# Patient Record
Sex: Female | Born: 1955
Health system: Southern US, Community
[De-identification: ages and names within clinical notes are randomized; demographics above are authoritative.]

## PROBLEM LIST (undated history)

## (undated) DIAGNOSIS — J449 Chronic obstructive pulmonary disease, unspecified: Secondary | ICD-10-CM

## (undated) DIAGNOSIS — Z720 Tobacco use: Secondary | ICD-10-CM

## (undated) DIAGNOSIS — K219 Gastro-esophageal reflux disease without esophagitis: Secondary | ICD-10-CM

## (undated) DIAGNOSIS — I509 Heart failure, unspecified: Secondary | ICD-10-CM

## (undated) DIAGNOSIS — E785 Hyperlipidemia, unspecified: Secondary | ICD-10-CM

## (undated) DIAGNOSIS — E119 Type 2 diabetes mellitus without complications: Secondary | ICD-10-CM

## (undated) DIAGNOSIS — Z0389 Encounter for observation for other suspected diseases and conditions ruled out: Secondary | ICD-10-CM

## (undated) DIAGNOSIS — E7800 Pure hypercholesterolemia, unspecified: Secondary | ICD-10-CM

## (undated) DIAGNOSIS — F209 Schizophrenia, unspecified: Secondary | ICD-10-CM

## (undated) DIAGNOSIS — IMO0001 Reserved for inherently not codable concepts without codable children: Secondary | ICD-10-CM

## (undated) DIAGNOSIS — Z86718 Personal history of other venous thrombosis and embolism: Secondary | ICD-10-CM

## (undated) HISTORY — DX: Personal history of other venous thrombosis and embolism: Z86.718

## (undated) HISTORY — DX: Hyperlipidemia, unspecified: E78.5

## (undated) HISTORY — DX: Reserved for inherently not codable concepts without codable children: IMO0001

## (undated) HISTORY — DX: Gastro-esophageal reflux disease without esophagitis: K21.9

## (undated) HISTORY — DX: Tobacco use: Z72.0

## (undated) HISTORY — DX: Encounter for observation for other suspected diseases and conditions ruled out: Z03.89

## (undated) HISTORY — PX: OTHER SURGICAL HISTORY: SHX169

## (undated) HISTORY — DX: Schizophrenia, unspecified: F20.9

---

## 1998-08-14 ENCOUNTER — Inpatient Hospital Stay (HOSPITAL_COMMUNITY): Admission: EM | Admit: 1998-08-14 | Discharge: 1998-08-19 | Payer: Self-pay | Admitting: Psychiatry

## 1999-09-16 ENCOUNTER — Inpatient Hospital Stay (HOSPITAL_COMMUNITY): Admission: EM | Admit: 1999-09-16 | Discharge: 1999-10-01 | Payer: Self-pay | Admitting: *Deleted

## 2000-05-18 ENCOUNTER — Inpatient Hospital Stay (HOSPITAL_COMMUNITY): Admission: EM | Admit: 2000-05-18 | Discharge: 2000-06-06 | Payer: Self-pay | Admitting: Psychiatry

## 2000-06-19 ENCOUNTER — Inpatient Hospital Stay (HOSPITAL_COMMUNITY): Admission: EM | Admit: 2000-06-19 | Discharge: 2000-06-28 | Payer: Self-pay | Admitting: Psychiatry

## 2000-08-23 ENCOUNTER — Other Ambulatory Visit: Admission: RE | Admit: 2000-08-23 | Discharge: 2000-08-23 | Payer: Self-pay | Admitting: *Deleted

## 2001-01-01 ENCOUNTER — Inpatient Hospital Stay (HOSPITAL_COMMUNITY): Admission: EM | Admit: 2001-01-01 | Discharge: 2001-01-09 | Payer: Self-pay | Admitting: Psychiatry

## 2001-02-07 ENCOUNTER — Encounter: Payer: Self-pay | Admitting: Family Medicine

## 2001-02-07 ENCOUNTER — Ambulatory Visit (HOSPITAL_COMMUNITY): Admission: RE | Admit: 2001-02-07 | Discharge: 2001-02-07 | Payer: Self-pay | Admitting: Family Medicine

## 2002-03-05 ENCOUNTER — Inpatient Hospital Stay (HOSPITAL_COMMUNITY): Admission: EM | Admit: 2002-03-05 | Discharge: 2002-03-13 | Payer: Self-pay | Admitting: Psychiatry

## 2002-08-05 ENCOUNTER — Emergency Department (HOSPITAL_COMMUNITY): Admission: EM | Admit: 2002-08-05 | Discharge: 2002-08-05 | Payer: Self-pay | Admitting: Emergency Medicine

## 2002-08-05 ENCOUNTER — Encounter: Payer: Self-pay | Admitting: Emergency Medicine

## 2003-08-31 ENCOUNTER — Other Ambulatory Visit: Admission: RE | Admit: 2003-08-31 | Discharge: 2003-08-31 | Payer: Self-pay | Admitting: Family Medicine

## 2004-06-23 ENCOUNTER — Ambulatory Visit (HOSPITAL_COMMUNITY): Admission: RE | Admit: 2004-06-23 | Discharge: 2004-06-23 | Payer: Self-pay | Admitting: Internal Medicine

## 2006-05-14 ENCOUNTER — Emergency Department (HOSPITAL_COMMUNITY): Admission: EM | Admit: 2006-05-14 | Discharge: 2006-05-14 | Payer: Self-pay | Admitting: Family Medicine

## 2006-09-10 ENCOUNTER — Ambulatory Visit (HOSPITAL_COMMUNITY): Admission: RE | Admit: 2006-09-10 | Discharge: 2006-09-10 | Payer: Self-pay | Admitting: Internal Medicine

## 2008-05-11 ENCOUNTER — Encounter: Admission: RE | Admit: 2008-05-11 | Discharge: 2008-05-11 | Payer: Self-pay | Admitting: Internal Medicine

## 2008-08-11 ENCOUNTER — Encounter: Payer: Self-pay | Admitting: Internal Medicine

## 2008-08-11 ENCOUNTER — Emergency Department (HOSPITAL_COMMUNITY): Admission: EM | Admit: 2008-08-11 | Discharge: 2008-08-11 | Payer: Self-pay | Admitting: Emergency Medicine

## 2008-08-28 ENCOUNTER — Ambulatory Visit: Payer: Self-pay | Admitting: Internal Medicine

## 2008-08-28 DIAGNOSIS — E785 Hyperlipidemia, unspecified: Secondary | ICD-10-CM | POA: Insufficient documentation

## 2008-08-28 DIAGNOSIS — J90 Pleural effusion, not elsewhere classified: Secondary | ICD-10-CM | POA: Insufficient documentation

## 2008-08-29 LAB — CONVERTED CEMR LAB
ALT: 16 units/L (ref 0–35)
AST: 14 units/L (ref 0–37)
Alkaline Phosphatase: 109 units/L (ref 39–117)
Total Bilirubin: 0.4 mg/dL (ref 0.3–1.2)

## 2008-09-03 ENCOUNTER — Telehealth (INDEPENDENT_AMBULATORY_CARE_PROVIDER_SITE_OTHER): Payer: Self-pay | Admitting: *Deleted

## 2008-09-15 ENCOUNTER — Ambulatory Visit: Payer: Self-pay | Admitting: Internal Medicine

## 2008-09-15 LAB — CONVERTED CEMR LAB
Basophils Absolute: 0.1 10*3/uL (ref 0.0–0.1)
Eosinophils Absolute: 1.3 10*3/uL — ABNORMAL HIGH (ref 0.0–0.7)
HCT: 42.5 % (ref 36.0–46.0)
Hemoglobin: 14.8 g/dL (ref 12.0–15.0)
Lymphs Abs: 2.7 10*3/uL (ref 0.7–4.0)
MCHC: 34.8 g/dL (ref 30.0–36.0)
Monocytes Absolute: 0.8 10*3/uL (ref 0.1–1.0)
Neutro Abs: 7.6 10*3/uL (ref 1.4–7.7)
Platelets: 292 10*3/uL (ref 150.0–400.0)
RDW: 12.6 % (ref 11.5–14.6)

## 2008-09-18 ENCOUNTER — Ambulatory Visit (HOSPITAL_COMMUNITY): Admission: RE | Admit: 2008-09-18 | Discharge: 2008-09-18 | Payer: Self-pay | Admitting: Internal Medicine

## 2008-10-28 ENCOUNTER — Ambulatory Visit: Payer: Self-pay | Admitting: Internal Medicine

## 2008-10-28 DIAGNOSIS — R05 Cough: Secondary | ICD-10-CM

## 2008-12-14 ENCOUNTER — Encounter: Payer: Self-pay | Admitting: Internal Medicine

## 2010-06-12 ENCOUNTER — Encounter: Payer: Self-pay | Admitting: Internal Medicine

## 2010-08-31 LAB — BODY FLUID CELL COUNT WITH DIFFERENTIAL
Eos, Fluid: 16 %
Lymphs, Fluid: 79 %
Monocyte-Macrophage-Serous Fluid: 2 % — ABNORMAL LOW (ref 50–90)

## 2010-08-31 LAB — AMYLASE, BODY FLUID: Amylase, Fluid: 82 U/L

## 2010-08-31 LAB — GLUCOSE, SEROUS FLUID: Glucose, Fluid: 91 mg/dL

## 2010-08-31 LAB — PROTEIN, BODY FLUID

## 2010-09-01 LAB — RHEUMATOID FACTOR: Rhuematoid fact SerPl-aCnc: <20 IU/mL (ref 0–20)

## 2010-09-01 LAB — POCT I-STAT, CHEM 8
Calcium, Ion: 1.17 mmol/L (ref 1.12–1.32)
Chloride: 101 mEq/L (ref 96–112)
Glucose, Bld: 84 mg/dL (ref 70–99)
HCT: 43 % (ref 36.0–46.0)

## 2010-09-01 LAB — SEDIMENTATION RATE: Sed Rate: 18 mm/hr (ref 0–22)

## 2010-09-01 LAB — ANA: Anti Nuclear Antibody(ANA): NEGATIVE

## 2010-09-01 LAB — POCT CARDIAC MARKERS: Troponin i, poc: <0.05 ng/mL (ref 0.00–0.09)

## 2010-10-07 NOTE — H&P (Signed)
NAME:  Kim [name], Kim [name]                          ACCOUNT NO.:  [id number]   MEDICAL RECORD NO.:  [id number]                   PATIENT TYPE:  IPS   LOCATION:  0400                                 FACILITY:  [facility]   PHYSICIAN:  Jeanice Lim, M.D.              DATE OF BIRTH:  [birth year]   DATE OF ADMISSION:  03/05/2002  DATE OF DISCHARGE:                         PSYCHIATRIC ADMISSION ASSESSMENT   IDENTIFYING INFORMATION:  The patient is a 55 year old single white female  involuntarily admitted on March 05, 2002.   HISTORY OF PRESENT ILLNESS:  The patient is here on papers.  Commitment  papers report that patient has been uncooperative with taking medications.  She has been missing appointments, delusional, seeing others replaced with  imposters, anxious and irritable with decrease in her ADLs.  The patient has  been monitored by the [facility] team but has been, as stated, uncooperative.  The  patient has been speaking to dead relatives.  She thinks she is 55 years  old.  The patient's thought processes are very disorganized and  unintelligible and information is basically obtained from chart documents.   PAST PSYCHIATRIC HISTORY:  Fourth visit to [facility].  The  patient has been admitted for similar problems.  Has a history of paranoid  schizophrenia.  Is followed by the [facility] team and Dr. Floyde Parkins.   SOCIAL HISTORY:  This is a 55 year old Caucasian female who lives alone.  She is on disability.   FAMILY HISTORY:  Unclear.   ALCOHOL/DRUG HISTORY:  Appears to be no substance abuse problem.   PRIMARY CARE PHYSICIAN:  Unknown.   MEDICAL PROBLEMS:  No apparent health problems indicated.   MEDICATIONS:  The patient has been on Risperdal 0.5 mg b.i.d. with 3 mg  q.h.s., Cogentin 1 mg b.i.d. and trazodone 50 mg q.h.s.  The patient has  been noncompliant with her medications for some time.   ALLERGIES:  No known allergies.   PHYSICAL EXAMINATION:  GENERAL:  The patient  is very sleepy.  She is a 89-  year-old Caucasian female in bed and in no acute distress.  She is unkempt.  Appears older than stated age.  HEENT:  Her head is normocephalic.  Her hair is long, pulled back, dirty.  No eye contact.  External ear canals are patent.  The patient hears me  without any difficulty.  NECK:  Supple.  Negative lymphadenopathy.  Thyroid is nonpalpable.  CHEST:  Clear to auscultation.  No cough.  No adventitious sounds.  The  patient appears to be in no acute distress.  Respiratory rate is at 20.  HEART:  Regular rate and rhythm without murmurs, gallops or rubs.  Carotid  pulses are equal.  There was no edema noted.  BREASTS:  Exam is deferred.  ABDOMEN:  Soft, nontender abdomen.  No masses or organomegaly.  MUSCULOSKELETAL:  The patient was observed walking unsteady in her gait but  there appears to be no signs of injury.  SKIN:  Warm and dry with strong bilateral radial pulses.  Nail beds are  pink, dirty and nails of different sizes.  No rashes or lacerations were  noted.  NEUROLOGIC:  The patient's thought processes are very disorganized,  delusional.  She is unable to perform cerebellar function at this time.   LABORATORY DATA:  WBC count is elevated at 11, RDW is elevated mildly at 15,  BUN 3, potassium 2.2, albumin 3.2, alkaline phosphatase is elevated at 122.   MENTAL STATUS EXAM:  The patient is very sleepy.  There is no eye contact.  She is unkempt.  Her speech is slurred and unintelligible, answering  inappropriately to questions.  Thought processes are incoherent, delusional  with positive auditory hallucinations.  Cognitively, she is oriented to  place.  Knew she was in [place].  She answers to her name.  Difficult to  ascertain any other cognitive functioning.  Her judgment and insight are  poor.   DIAGNOSES:   AXIS I:  Paranoid schizophrenia.   AXIS II:  Deferred.   AXIS III:  None.   AXIS IV:  Severe.   AXIS V:  Current 25; estimated  this past year 60.   PLAN:  Involuntary commitment to [facility] for psychosis.  Contract for safety.  Check every 15 minutes.  The patient to be placed on  the [address] for close monitoring.  Will check her labs.  Give patient  potassium as needed and monitor her electrolytes closely.  Will monitor her  food and fluid intake.  We will resume her medications and have Zydis  Zyprexa available for agitation.  Plan is to stabilize her mood and thinking  so patient can be safe and functional, for patient to be medication-  compliant, to follow up with the [facility] team, to decrease further  hospitalizations.   TENTATIVE LENGTH OF STAY:  Five days or more depending on patient's response  to medication.     Landry Corporal, N.P.                       Jeanice Lim, M.D.    JO/MEDQ  D:  03/06/2002  T:  03/06/2002  Job:  [id number]

## 2010-10-07 NOTE — H&P (Signed)
[facility]  Patient:    Kim [name], Kim [name]                         MRN: [id number] Adm. Date:  [id number] Attending:  Otilio Saber Dictator:   Eduard Roux, N.P.                   Psychiatric Admission Assessment  DATE OF ADMISSION:  September 16, 1999.  PATIENT IDENTIFICATION:  Ms. Conrow is a 55 year old white single female, involuntarily admitted secondary to increase in psychotic behavior and delusional thinking.  HISTORY OF PRESENT ILLNESS:  The patient is a client of [facility] and was referred to [place] via their services.  According to reports, the patient has been noncompliant with her medication.  She is not sleeping nor eating, not caring for her hygiene or herself.  The patient presents floridly psychotic.  She currently thinks that she is a 63-year-old child named [place]. She also thinks she has machines in her head with triangles that are making her strong as well as making her weak.  She has voiced that her brother is an alien.  She also thinks she is wearing a face mask of one of her relatives. Upon presentation to the unit on September 16, 1999, the patient was very resistant to the admission process, refused to answer questions.  She is continuing to refuse to have her blood drawn.  She states if her blood escapes her body that it will kill everyone around [place] in this area.  She also states she has no place to "sit."  She states she has "sat at a friends house" for four days but was unable to "sit there any longer."  The patient states she has no place to live.  She is very difficult to interview secondary to her floridly psychotic state at present.  PAST PSYCHIATRIC HISTORY:  Per intake report, client is a client of [facility].  She has had numerous inpatient hospitalizations and has a lengthy history of mental illness.  She has been in [facility] several times, [facility] in March  of 2000 and September of 1997.  Most recent hospitalization was in February 2001 in [place], [place].  I was unable to get a clear picture of previous psychotropic medication trials that patient has tried.  SOCIAL HISTORY:  Per intake, the patient lives with her brother as stated in HPI.  The patient currently believes that she is homeless.  FAMILY HISTORY:  Per intake, both brother and her mother are schizophrenic.  ALCOHOL AND DRUG HISTORY:  Patient denies.  PAST MEDICAL HISTORY:  Primary care Arrietty Dercole:  Patient denies.  Medical problems:  Difficulty to ascertain.  The patients physical examination ______ her mental problems as her body, she states it is "not her own, it is this relatives body."  MEDICATIONS:  Per [facility], the patient was on: 1. Seroquel 100 mg b.i.d. and 400 mg q.h.s. 2. Possibly Restoril 30 mg q.h.s.  It appears the patient has been noncompliant since approximately October.  DRUG ALLERGIES:  Per [facility], no known allergies.  The patient was unable to supply the information.  PHYSICAL EXAMINATION:  Physical examination is pending.  The patient is floridly psychotic at present and physical examination is not appropriate. Her vital signs are stable upon admission to the unit.  She is afebrile at 98.4, pulse is 117, respirations  are 20, blood pressure is 128/88, 116/86. She is coughing but it appears to be nonproductive.  She is very active on the unit.  She denies chest pain, shortness of breath, and again the patient is refusing to have laboratory test drawn.  She appears to be in no acute distress.  MENTAL STATUS EXAMINATION:  Appearance and behavior:  A disheveled white female.  Her speech is normal tone.  It is rapid, irrelevant.  Mood and affect are floridly psychotic.  She appears agitated and intermittently paranoid. Her eyes are darting around.  Her thought processes are extremely disorganized and  delusional.  She denies suicidal ideation and homicidal ideation.  It is difficult to ascertain whether patient has a history of self-harm or any aggressive acts towards others.  She appears to be attending to internal stimuli.  Unable to ascertain her cognitive of function.  ADMISSION DIAGNOSES: Axis I:    Schizophrenia. Axis II:   Deferred. Axis III:  Unable to ascertain. Axis IV:   Severe related to psychosocial problems and mental illness. Axis V:    Current GAF is 25, highest past year is 60.  TREATMENT PLAN AND RECOMMENDATIONS:  Involuntary admission to [facility] for stabilization of psychotic symptoms.  We will provide 15 minute checks for safety.  We will additionally continue to try to get screening laboratory values to ascertain patients medical stability.  Again, as noted under physical examination.  We will attempt to perform one when patients condition stabilizes.  She does not appear to be in any acute distress; however, she is floridly psychotic.  We will discontinue the Risperdal.  The patient states she does not recognize that name and will not take it.  Seroquel 200 mg now, dose given.  The patient did take that in front of this Clinical research associate.  We will additionally try to resume Seroquel 100 mg b.i.d. and 400 mg q.h.s. as per [facility] records.  The patient may take this medication since she does recognize it.  We also will have orders written for Ativan 2 mg p.o. or IM q.4h. p.r.n. for agitation.  Haldol 5 mg p.o. or IM 14 p.r.n. for severe agitation.  Restoril 30 mg p.o. q.h.s. p.r.n. for sleep.  TENTATIVE LENGTH OF STAY:  Five days with followup at [facility].  If patient continues to be very resistant to medication, may have to evaluate a force med order. DD:  09/20/99 TD:  09/21/99 Job: [id number]
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
## 2010-10-07 NOTE — Discharge Summary (Signed)
[facility]  Patient:    Kim [name], Kim [name]                         MRN: [id number] Adm. Date:  [id number] Disc. Date: [id number] Attending:  Otilio Saber Dictator:   Valinda Hoar, N.P.                           Discharge Summary  HISTORY OF PRESENT ILLNESS:  Kim [name] is a 55 year old white single female, involuntarily admitted secondary to increase in psychotic behavior and delusional thinking.  The patient is a client of the [facility] and was referred to [place] via their services.  According to reports the patient has not Kim [name] compliant with her medications.  She is not sleeping or eating, has not Kim [name] caring for her hygiene or herself.  The patient presents floridly psychotic.  She currently thinks that she is a 50-year-old named Armed forces logistics/support/administrative officer.  She also thinks she has machines in her head with triangles that are making her strong as well as making her weak.  She hears voices that her brother is an alien.  She also thinks she is wearing a face mask of one of her relatives.  Upon presentation at the unit September 16, 1999, the patient was very resistant to the admission process, refused to answer questions.  She is continuing to refuse to have her blood drawn.  She states her blood escapes her body, then it will kill everyone around [place] in the area.  She also states she has no place to "sit."  She states she has "sat" at a friends house for four days, but was unable to "sit there any longer."  Patient states that she has no place to live.  She is very difficult to interview, secondary to her floridly psychotic state at present.  PAST MEDICAL HISTORY:  Patient states she does not have a primary care physician.  She said she had no medical problems, but this was difficult to ascertain.  ADMISSION MEDICATIONS: 1. Per [facility], she is on Seroquel 100 mg b.i.d. and    400 mg at h.s. 2. Possibly Restoril 30 mg q.h.s.  It  appears that the client has Kim [name]    noncompliant since approximately October.  DRUG ALLERGIES:  Per [facility], no known drug allergies.  The patient was unable to supply [place] any of this information.  PHYSICAL EXAMINATION:  Physical exam is pending.  The patient is floridly psychotic at present, and physical examination is not appropriate.  Her vital signs are stable upon admission to the unit.  She is afebrile at 98.4, pulse was 117, respirations 20, blood pressure 128/88.  She is coughing, but it appears to be nonproductive.  She is very active on the unit.  She denies chest pain, shortness of breath, and again the patient is refusing to have laboratory tests drawn.  She appears to be in no acute distress at this time.  MENTAL STATUS EXAMINATION:  A disheveled white female.  Speech is normal tone, rapid but irrelevant.  Mood and affect floridly psychotic.  She appears agitated and intermittently paranoid.  Eyes are darting around.  Her thought processes are extremely disorganized and delusional.  She denies suicidal ideation.  Homicidal ideation is difficult to ascertain via the patient with a history of self-harm or any aggressive acts towards others.  She appears to  be attending to internal stimuli.  I am unable to ascertain her cognitive functioning at this time.  LABORATORY DATA:  She did have an EKG which had nonspecific T-wave abnormalities, otherwise she had no lab work done and she was admitted directly to our unit and again she refused to have a physical exam, refused all lab work.  ADMITTING DIAGNOSES: Axis I:    Schizophrenia. Axis II:   Deferred. Axis III:  Unable to ascertain due to the psychotic state. Axis IV:   Severe, related to psychosocial problems and mental illness. Axis V:    Current GAF is 25, highest in the past year has Kim [name] 60.  HOSPITAL COURSE:  The patient was admitted to the [facility] unit and initially we did start  her on Risperdal 0.5 mg p.o. b.i.d. and 1 mg h.s., Ativan 0.5 mg p.o. q.4h. p.r.n. agitation, Risperdal 30 mg p.o. h.s. p.r.n. for sleep.  On May 1, the patient was a little calmer appearing, less paranoid and irritable.  She is still quite delusional, saying she has triangles marching in her body, denying she is herself, and still believing her teeth are not hers.  Patient is sleeping fairly well, appetite fair.  Seroquel was increased and we added a small amount of Haldol.  She continued to be psychotic, remained delusional about the triangles marching in her body.  She was eating and sleeping better.  She did bathe.  On May 5 she remained on 700 of Seroquel, 4 mg of Haldol, and she appeared to be tolerating the meds well, and therefore the Seroquel was increased to 800 mg at h.s.  On May 6 she had Kim [name] sleeping good, appetite was good.  She was depressed about being in the hospital, but not homicidal or suicidal.  She continued to be delusional about the triangle machines in her body to reduce the heart.  She goes into a subconscious state of consciousness.  She says when the triangles start marching, she still feels like somebody is trying enslave her for unknown reason.  She still feels _________ control of herself.  Again there were side effects and she was tolerating 800 mg of Seroquel with no problems.  The next day she was seen and she agreed to sign involuntarily, so she was released from an involuntary commitment.  Sleep good, appetite good.  She was not suicidal.  She was angry at ex-guardian.  She feels her monies are being stolen from her. She continued to be delusional, although she was calmer.  On the day of discharge she reported feeling better.  She still has some paranoid delusions, but not as upset or bothered by them.  Her mood and affect were euthymic.  Patient still withdrawn.  Sleeping and eating well.  She was less sedated, tolerating meds, with no OPS or abnormal  movement.  No suicidal or homicidal thoughts.  It was decided that she had reached her baseline and that she could be discharged safely and be followed by the [facility].   CONDITION ON DISCHARGE:  Patient is discharged in improved condition with improvement in her mood, sleep, appetite, moderate alleviation of her delusions and paranoia, but basically still having thoughts about the triangles, and this appears to be her baseline according to Mental Health.  It was felt that she could be managed safely on outpatient basis as long as she is followed up by mental health center.  FOLLOW-UP:  She is to follow up at [facility]  Center May 18, 4 p.m. with [name].  DISCHARGE MEDICATIONS: 1. Zyprexa 15 mg one tablet at bedtime. 2. Seroquel 100 mg three tablets at bedtime.  FINAL DIAGNOSES: Axis I:    Schizophrenia, undifferentiated type. Axis II:   Deferred. Axis III:  Unable to ascertain. Axis IV:   Moderate, related to mental illness. Axis V:    Current global assessment of functioning 50, highest in the past            year 60. DD:  10/13/99 TD:  10/15/99 Job: [id number]

## 2010-10-07 NOTE — Discharge Summary (Signed)
NAME:  Kim [name], Kim [name]                          ACCOUNT NO.:  [id number]   MEDICAL RECORD NO.:  [id number]                   PATIENT TYPE:  IPS   LOCATION:  0400                                 FACILITY:  [facility]   PHYSICIAN:  Jeanice Lim, M.D.              DATE OF BIRTH:  02/17/1956   DATE OF ADMISSION:  03/05/2002  DATE OF DISCHARGE:  03/13/2002                                 DISCHARGE SUMMARY   IDENTIFYING DATA:  This is a 55 year old single Caucasian female  involuntarily admitted due to being uncooperative taking medications,  missing appointments.  She was delusional, feeling that others have replaced  people near her with imposters, decreased ADLs, had been speaking to dead  relatives.   MEDICATIONS:  In the past, Risperdal, Cogentin, trazodone.  The patient had  been noncompliant with medications for some time.   ALLERGIES:  No known drug allergies.   PHYSICAL EXAMINATION:  Essentially within normal limits.  Neurologically  nonfocal.   LABORATORY DATA:  WBC slightly elevated at 11.  Potassium significantly low  at 2.2.   MENTAL STATUS EXAM:  The patient was quite sleepy.  There was no eye  contact.  Unkempt.  Speech slurred and unintelligible, inappropriately  answering questions.  Thought processes incoherent, delusional, positive for  auditory hallucinations.  She was cognitively oriented to place.  She knew  she was in [place] and answered to her name.  Otherwise difficult to  ascertain other cognitive functioning.  Judgment and insight were poor.   ADMISSION DIAGNOSES:   AXIS I:  Paranoid schizophrenia.   AXIS II:  None.   AXIS III:  Hypokalemia, significant.   AXIS IV:  Severe (problems with limited support system).   AXIS V:  25/60.   HOSPITAL COURSE:  The patient was admitted and placed on the 35 Hall for  close observation and underwent further monitoring.  She was medically  monitored closely due to degree of psychosis and low potassium.   Risperdal  was optimized and patient was given potassium for replacement.  The patient  tolerated optimization of Risperdal, became less paranoid and less  distressed without psychotic symptoms.  The patient gradually reported no  preoccupation with delusional thoughts.  No distress from fixed delusions  and no mood swings.  No dangerous ideation or grossly inappropriate  behavior.   CONDITION ON DISCHARGE:  Markedly improved.  Mood was more euthymic.  Affect  brighter.  Thought processes goal directed.  Thought content negative for  dangerous ideation.  The patient reported no distress from fixed delusions  nor preoccupation with delusions and patient reported motivation to be  compliant with medications and follow-up plan.   DISCHARGE MEDICATIONS:  1. Risperdal 2 mg, 1/2 q.a.m., 1/2 at 3 p.m. and 1/2 q.h.s.  2. K-Dur 20 mEq q.a.m. and q.h.s.  3. Cogentin 1 mg t.i.d.   DISCHARGE DIAGNOSES:   AXIS I:  Paranoid schizophrenia.  AXIS II:  None.   AXIS III:  Hypokalemia, significant.   AXIS IV:  Severe (problems with limited support system).   AXIS V:  Global Assessment of Functioning on discharge 50-55.                                               Jeanice Lim, M.D.    JEM/MEDQ  D:  03/31/2002  T:  03/31/2002  Job:  [id number]

## 2010-10-07 NOTE — Discharge Summary (Signed)
[facility]  Patient:    Kim [name], Kim [name] Visit Number: [id number] MRN: [id number]          Service Type: PSY Location: 40 0403 01 Attending Physician:  Rachael Fee Dictated by:   Reymundo Poll Dub Mikes, M.D. Admit Date:  01/01/2001 Discharge Date: 01/09/2001                             Discharge Summary  CHIEF COMPLAINT AND HISTORY OF PRESENT ILLNESS:  This was the third admission to [facility] for this 55 year old female who was petitioned for involuntary commitment due to somatic delusions and self-endangerment.  Has been exhibiting poor response to treatment.  Somatic delusions, poor attention to her ADLs, and is reported to be dangerous to herself, petitioned by [facility].  Felt had received the skin of someone else, she needed to be incinerated.  She was also felt that she was a Administrator, Civil Service in [place].  Is denying any auditory or visual hallucinations, reported some depressive symptoms.  Thought there were some men at work who wanted to take her to a hotel, she was fearful.  Denied any suicidal or homicidal ideas. Reported sleep and appetite had been satisfactory.  Some questionable compliance with medication.  PAST PSYCHIATRIC HISTORY:  Third hospitalization, last visit was four or five months ago.  Long history of schizophrenia and multiple hospitalizations ______ institution.  SUBSTANCE ABUSE HISTORY:  Denied the use or abuse of any substances.  MEDICATIONS UPON ADMISSION: 1. Cogentin 1 mg in the morning and 2 mg at bedtime. 2. Risperdal 1 mg at bedtime. 3. Magnesium oxide 400 mg daily. 4. Haldol 100 mg injection every four weeks, last injection August 2.  MENTAL STATUS EXAMINATION UPON ADMISSION:  Revealed an well-nourished, well-developed, alert, cooperative female, good eye contact, unkempt, rocking back and forth in her chair.  Speech was normal tone, somewhat irrelevant answers.  Mood was anxious.  Affect was  anxious.  Thought process: Questionable auditory hallucinations, some thought blocking, no visual hallucinations, positive delusions, positive paranoia.  Cognition: Well preserved.  ADMITTING DIAGNOSES: Axis I:    Schizophrenia, chronic, undifferentiated. Axis II:   No diagnosis. Axis III:  No diagnosis. Axis IV:   Moderate. Axis V:    Global assessment of functioning upon admission 25, highest global            assessment of functioning in the last year 55-60  HOSPITAL COURSE:  She was admitted and started in intensive individual and group psychotherapy, working to improve reality testing.  She was kept on the Risperdal 0.5 mg in the morning and 1 mg at bedtime, eventually was increased to 0.25 twice a day and 2 mg at bedtime.  We went ahead and finally increased the Risperdal to 0.5 mg twice a day a 3 mg at bedtime.  Slowly, she started coming around.  The delusional ideas were not strong and they were not spontaneously manifested.  She was going to let her job go, it was felt that was a source a stress and mental health was after her to do that.  So she felt that once she decreased the stress she was going to be able to handle things better.  Upon discharge, she was in full contact with reality, mood improved, affect bright, no spontaneous delusional ideas, no suicidal ideas, no homicidal ideas.  DISCHARGE DIAGNOSES: Axis I:    Schizophrenia, chronic, undifferentiated. Axis II:  No diagnosis. Axis III:  No diagnosis. Axis IV:   Moderate. Axis V:    Global assessment of functioning upon discharge 55-60.  DISCHARGE MEDICATIONS: 1. Risperdal 3 mg at bedtime and 0.5 mg twice a day. 2. Cogentin 1 mg twice a day. 3. Ativan 0.5 mg three times a day. 4. Trazodone 50 mg at bedtime. 5. Naproxen 550 mg twice a day.  FOLLOWUP:  [facility]. Dictated by:   Reymundo Poll Dub Mikes, M.D. Attending Physician:  Rachael Fee DD:  02/13/01 TD:  02/14/01 Job:  [id number]

## 2010-10-07 NOTE — H&P (Signed)
[facility]  Patient:    Kim [name], FILTER                         MRN: [id number] Adm. Date:  [id number] Attending:  Beryle Beams                   Psychiatric Admission Assessment  Your Honor;       Ms. Provencal is a 55 year old single Caucasian female who was committed involuntarily to the [facility] on June 19, 2000.  At the time of her admission, the patient was acutely psychotic.  The patient went to the police claiming that people were breaking into her apartment, stealing the body parts of her relatives.  The patient had delusions of age regression and claimed that she was 78-1/2 weeks of age.  She also stated that she had machines in her head.  The patient had not been attending to her activities of daily living.       The patient has a history of multiple psychiatric hospitalizations, several to the [facility] over the past couple of months.  Also, and admission to [facility] in April 2000.  She is under the care of [facility] and sees Dr. [name].  Traditionally, the patient has been medication noncompliant.       At present, the patient is optimally improved.  Her hallucinations have resolved and her delusion that she is two weeks or two months old has defervesced.  She is tolerating her medications well without evidence of tardive dyskinesia, medication reactions or sedation.  Behavior is generally appropriate.  However, given her history of medical noncompliance, I am recommending her discharge from the unit on an outpatient commitment.  The [facility] is aware of this recommendation.  We have talked to Ansel Bong and Eliezer Mccoy at [facility], and they agreed that, given her her recurrent hospitalizations, referral will be made to the [facility] on day program.  Towards that end, and in the service of reducing the number of her psychiatric hospitalizations,  outpatient commitment seems appropriate.  Yours sincerely; DD:  06/28/00 TD:  06/28/00 Job: [id number]

## 2010-12-15 ENCOUNTER — Ambulatory Visit
Admission: RE | Admit: 2010-12-15 | Discharge: 2010-12-15 | Disposition: A | Discharge status: Home / Self Care | Payer: Medicare Other | Source: Ambulatory Visit | Attending: Adult Health Nurse Practitioner | Admitting: Adult Health Nurse Practitioner

## 2010-12-15 ENCOUNTER — Other Ambulatory Visit: Payer: Self-pay | Admitting: Adult Health Nurse Practitioner

## 2010-12-15 DIAGNOSIS — R10819 Abdominal tenderness, unspecified site: Secondary | ICD-10-CM

## 2010-12-15 DIAGNOSIS — R140 Abdominal distension (gaseous): Secondary | ICD-10-CM

## 2011-04-03 ENCOUNTER — Encounter: Payer: Self-pay | Admitting: *Deleted

## 2011-04-04 ENCOUNTER — Institutional Professional Consult (permissible substitution): Payer: Medicare Other | Admitting: Emergency Medicine

## 2011-06-13 DIAGNOSIS — R0609 Other forms of dyspnea: Secondary | ICD-10-CM | POA: Diagnosis not present

## 2011-06-13 DIAGNOSIS — F17200 Nicotine dependence, unspecified, uncomplicated: Secondary | ICD-10-CM | POA: Diagnosis not present

## 2011-06-13 DIAGNOSIS — R05 Cough: Secondary | ICD-10-CM | POA: Diagnosis not present

## 2011-06-13 DIAGNOSIS — J40 Bronchitis, not specified as acute or chronic: Secondary | ICD-10-CM | POA: Diagnosis not present

## 2011-06-13 DIAGNOSIS — R0989 Other specified symptoms and signs involving the circulatory and respiratory systems: Secondary | ICD-10-CM | POA: Diagnosis not present

## 2011-06-14 ENCOUNTER — Other Ambulatory Visit: Payer: Self-pay | Admitting: Adult Health Nurse Practitioner

## 2011-06-14 ENCOUNTER — Ambulatory Visit
Admission: RE | Admit: 2011-06-14 | Discharge: 2011-06-14 | Disposition: A | Discharge status: Home / Self Care | Payer: Medicare Other | Source: Ambulatory Visit | Attending: Adult Health Nurse Practitioner | Admitting: Adult Health Nurse Practitioner

## 2011-06-14 DIAGNOSIS — R509 Fever, unspecified: Secondary | ICD-10-CM | POA: Diagnosis not present

## 2011-06-14 DIAGNOSIS — F17200 Nicotine dependence, unspecified, uncomplicated: Secondary | ICD-10-CM | POA: Diagnosis not present

## 2011-06-14 DIAGNOSIS — R05 Cough: Secondary | ICD-10-CM

## 2011-06-14 DIAGNOSIS — R918 Other nonspecific abnormal finding of lung field: Secondary | ICD-10-CM | POA: Diagnosis not present

## 2011-06-14 DIAGNOSIS — R0602 Shortness of breath: Secondary | ICD-10-CM

## 2011-06-14 DIAGNOSIS — J40 Bronchitis, not specified as acute or chronic: Secondary | ICD-10-CM | POA: Diagnosis not present

## 2011-06-28 DIAGNOSIS — R0989 Other specified symptoms and signs involving the circulatory and respiratory systems: Secondary | ICD-10-CM | POA: Diagnosis not present

## 2011-06-28 DIAGNOSIS — R0609 Other forms of dyspnea: Secondary | ICD-10-CM | POA: Diagnosis not present

## 2011-06-28 DIAGNOSIS — R05 Cough: Secondary | ICD-10-CM | POA: Diagnosis not present

## 2011-06-30 ENCOUNTER — Encounter: Payer: Self-pay | Admitting: Pulmonary Disease

## 2011-06-30 ENCOUNTER — Ambulatory Visit (INDEPENDENT_AMBULATORY_CARE_PROVIDER_SITE_OTHER): Payer: Medicare Other | Admitting: Pulmonary Disease

## 2011-06-30 ENCOUNTER — Ambulatory Visit (HOSPITAL_COMMUNITY)
Admission: RE | Admit: 2011-06-30 | Discharge: 2011-06-30 | Disposition: A | Discharge status: Home / Self Care | Payer: Medicare Other | Source: Ambulatory Visit | Attending: Pulmonary Disease | Admitting: Pulmonary Disease

## 2011-06-30 VITALS — BP 110/70 | HR 85 | Temp 98.5°F | Ht 65.0 in | Wt 183.6 lb

## 2011-06-30 DIAGNOSIS — J90 Pleural effusion, not elsewhere classified: Secondary | ICD-10-CM

## 2011-06-30 DIAGNOSIS — J9819 Other pulmonary collapse: Secondary | ICD-10-CM | POA: Diagnosis not present

## 2011-06-30 DIAGNOSIS — R918 Other nonspecific abnormal finding of lung field: Secondary | ICD-10-CM | POA: Diagnosis not present

## 2011-06-30 NOTE — Patient Instructions (Addendum)
Schedule spirometry -pre/post (breathing test) Xray results-no fluid in lungs, pneumonia has resolved

## 2011-06-30 NOTE — Progress Notes (Signed)
  Subjective:    Patient ID: Kim [name], female    DOB: 01/13/1956, 56 y.o.   MRN: [id number]  HPI  PCP - [facility],Kim [name] ANP  55 yowf  schizophrenic smoker from a group home referred for evaluation of pna & effusion  She presented with Left Pleural effusion 07/2008 -Underwent Left thoracentesis x 1050 09/18/2008> ex with 79% lymphs, 16% eos WBC 3610 Last seen 6/10 >> eos 10.5 % with ESR only 17 not specific but this probably represents a late parapneumonic process with organization/ fibrotic change.   She was seen by PCP 06/13/11 , for productive cough, CXR showed rt lung basal opacity & blunting ofleft costophrenic angle - given ceftx IM & levaquin x 7ds, started on spiriva 'Can I have fluid taken off from lungs?' Smokes 10 cigs /day, no phlegm, dyspnea + Continues to hear voices on risperdal 4 mg daily CXR - shows resolved RLL process,left cp angle blunting cold represent pleural thickening, not much layering on decub film     Review of Systems Patient denies significant dyspnea,cough, hemoptysis,  chest pain, palpitations, pedal edema, orthopnea, paroxysmal nocturnal dyspnea, lightheadedness, nausea, vomiting, abdominal or  leg pains      Objective:   Physical Exam  Gen. Unkempt, disshevelled, in no distress, normal affect ENT - no lesions, no post nasal drip Neck: No JVD, no thyromegaly, no carotid bruits Lungs: no use of accessory muscles, no dullness to percussion, no rhonchi , LLL crackles Cardiovascular: Rhythm regular, heart sounds  normal, no murmurs or gallops, no peripheral edema Abdomen: soft and non-tender, no hepatosplenomegaly, BS normal. Musculoskeletal: No deformities, no cyanosis or clubbing Neuro:  alert, non focal       Assessment & Plan:

## 2011-07-01 NOTE — Assessment & Plan Note (Signed)
Minimal effusion vs thickening on decub film - does not need thoracentesis RLL pneumonia has resolved- no more abx required WOuld suggest spirometry - pre/post for airway obstruction &  to motivate her to quit Smoking cessation remains the most important intervention here but seems to be tied in to her mental illness. Further FU can be with pcp

## 2011-07-18 ENCOUNTER — Ambulatory Visit (INDEPENDENT_AMBULATORY_CARE_PROVIDER_SITE_OTHER): Payer: Medicare Other | Admitting: Pulmonary Disease

## 2011-07-18 DIAGNOSIS — R05 Cough: Secondary | ICD-10-CM

## 2011-07-18 DIAGNOSIS — F209 Schizophrenia, unspecified: Secondary | ICD-10-CM | POA: Diagnosis not present

## 2011-07-18 LAB — PULMONARY FUNCTION TEST

## 2011-07-18 NOTE — Progress Notes (Signed)
Spirometry before and after performed today. 

## 2011-07-20 ENCOUNTER — Telehealth: Payer: Self-pay | Admitting: Pulmonary Disease

## 2011-07-20 NOTE — Telephone Encounter (Signed)
Spriometry showed mild drop in lung capacity She has to focus on smoking cesation Further FU can be with pcp

## 2011-07-21 NOTE — Telephone Encounter (Signed)
Left message for pt to return call.

## 2011-07-26 DIAGNOSIS — J3089 Other allergic rhinitis: Secondary | ICD-10-CM | POA: Diagnosis not present

## 2011-07-26 DIAGNOSIS — G479 Sleep disorder, unspecified: Secondary | ICD-10-CM | POA: Diagnosis not present

## 2011-07-27 ENCOUNTER — Encounter: Payer: Self-pay | Admitting: Pulmonary Disease

## 2011-07-28 NOTE — Telephone Encounter (Signed)
I informed pt of RA's findings and recommendations. Pt verbalized understanding  

## 2011-09-21 DIAGNOSIS — G4730 Sleep apnea, unspecified: Secondary | ICD-10-CM | POA: Diagnosis not present

## 2011-09-21 DIAGNOSIS — G4733 Obstructive sleep apnea (adult) (pediatric): Secondary | ICD-10-CM | POA: Diagnosis not present

## 2011-09-25 DIAGNOSIS — R7309 Other abnormal glucose: Secondary | ICD-10-CM | POA: Diagnosis not present

## 2011-09-25 DIAGNOSIS — F17200 Nicotine dependence, unspecified, uncomplicated: Secondary | ICD-10-CM | POA: Diagnosis not present

## 2011-09-25 DIAGNOSIS — E559 Vitamin D deficiency, unspecified: Secondary | ICD-10-CM | POA: Diagnosis not present

## 2011-09-25 DIAGNOSIS — Z0000 Encounter for general adult medical examination without abnormal findings: Secondary | ICD-10-CM | POA: Diagnosis not present

## 2011-09-25 DIAGNOSIS — Z8659 Personal history of other mental and behavioral disorders: Secondary | ICD-10-CM | POA: Diagnosis not present

## 2011-09-25 DIAGNOSIS — E782 Mixed hyperlipidemia: Secondary | ICD-10-CM | POA: Diagnosis not present

## 2011-09-25 DIAGNOSIS — Z23 Encounter for immunization: Secondary | ICD-10-CM | POA: Diagnosis not present

## 2011-10-25 DIAGNOSIS — R1319 Other dysphagia: Secondary | ICD-10-CM | POA: Diagnosis not present

## 2011-10-25 DIAGNOSIS — G4730 Sleep apnea, unspecified: Secondary | ICD-10-CM | POA: Diagnosis not present

## 2011-10-26 DIAGNOSIS — F209 Schizophrenia, unspecified: Secondary | ICD-10-CM | POA: Diagnosis not present

## 2011-10-31 DIAGNOSIS — G4733 Obstructive sleep apnea (adult) (pediatric): Secondary | ICD-10-CM | POA: Diagnosis not present

## 2011-11-01 DIAGNOSIS — G4733 Obstructive sleep apnea (adult) (pediatric): Secondary | ICD-10-CM | POA: Diagnosis not present

## 2011-11-14 DIAGNOSIS — G4730 Sleep apnea, unspecified: Secondary | ICD-10-CM | POA: Diagnosis not present

## 2011-11-15 DIAGNOSIS — Z9119 Patient's noncompliance with other medical treatment and regimen: Secondary | ICD-10-CM | POA: Diagnosis not present

## 2011-11-15 DIAGNOSIS — G4730 Sleep apnea, unspecified: Secondary | ICD-10-CM | POA: Diagnosis not present

## 2011-11-15 DIAGNOSIS — F17200 Nicotine dependence, unspecified, uncomplicated: Secondary | ICD-10-CM | POA: Diagnosis not present

## 2011-11-15 DIAGNOSIS — R0602 Shortness of breath: Secondary | ICD-10-CM | POA: Diagnosis not present

## 2011-12-01 DIAGNOSIS — H00029 Hordeolum internum unspecified eye, unspecified eyelid: Secondary | ICD-10-CM | POA: Diagnosis not present

## 2011-12-26 DIAGNOSIS — F209 Schizophrenia, unspecified: Secondary | ICD-10-CM | POA: Diagnosis not present

## 2011-12-26 DIAGNOSIS — R05 Cough: Secondary | ICD-10-CM | POA: Diagnosis not present

## 2011-12-26 DIAGNOSIS — J3089 Other allergic rhinitis: Secondary | ICD-10-CM | POA: Diagnosis not present

## 2011-12-26 DIAGNOSIS — G4730 Sleep apnea, unspecified: Secondary | ICD-10-CM | POA: Diagnosis not present

## 2012-02-07 DIAGNOSIS — E559 Vitamin D deficiency, unspecified: Secondary | ICD-10-CM | POA: Diagnosis not present

## 2012-02-07 DIAGNOSIS — E119 Type 2 diabetes mellitus without complications: Secondary | ICD-10-CM | POA: Diagnosis not present

## 2012-02-21 DIAGNOSIS — F209 Schizophrenia, unspecified: Secondary | ICD-10-CM | POA: Diagnosis not present

## 2012-02-28 DIAGNOSIS — S8990XA Unspecified injury of unspecified lower leg, initial encounter: Secondary | ICD-10-CM | POA: Diagnosis not present

## 2012-02-28 DIAGNOSIS — S83419A Sprain of medial collateral ligament of unspecified knee, initial encounter: Secondary | ICD-10-CM | POA: Diagnosis not present

## 2012-02-28 DIAGNOSIS — S99929A Unspecified injury of unspecified foot, initial encounter: Secondary | ICD-10-CM | POA: Diagnosis not present

## 2012-02-28 DIAGNOSIS — M25569 Pain in unspecified knee: Secondary | ICD-10-CM | POA: Diagnosis not present

## 2012-04-03 DIAGNOSIS — F209 Schizophrenia, unspecified: Secondary | ICD-10-CM | POA: Diagnosis not present

## 2012-06-25 DIAGNOSIS — F209 Schizophrenia, unspecified: Secondary | ICD-10-CM | POA: Diagnosis not present

## 2012-07-03 DIAGNOSIS — J309 Allergic rhinitis, unspecified: Secondary | ICD-10-CM | POA: Diagnosis not present

## 2012-07-03 DIAGNOSIS — R1032 Left lower quadrant pain: Secondary | ICD-10-CM | POA: Diagnosis not present

## 2012-07-03 DIAGNOSIS — J0110 Acute frontal sinusitis, unspecified: Secondary | ICD-10-CM | POA: Diagnosis not present

## 2012-07-03 DIAGNOSIS — F17200 Nicotine dependence, unspecified, uncomplicated: Secondary | ICD-10-CM | POA: Diagnosis not present

## 2012-08-14 DIAGNOSIS — F17200 Nicotine dependence, unspecified, uncomplicated: Secondary | ICD-10-CM | POA: Diagnosis not present

## 2012-08-14 DIAGNOSIS — E669 Obesity, unspecified: Secondary | ICD-10-CM | POA: Diagnosis not present

## 2012-08-14 DIAGNOSIS — Z79899 Other long term (current) drug therapy: Secondary | ICD-10-CM | POA: Diagnosis not present

## 2012-08-14 DIAGNOSIS — E559 Vitamin D deficiency, unspecified: Secondary | ICD-10-CM | POA: Diagnosis not present

## 2012-08-14 DIAGNOSIS — R42 Dizziness and giddiness: Secondary | ICD-10-CM | POA: Diagnosis not present

## 2012-08-14 DIAGNOSIS — E782 Mixed hyperlipidemia: Secondary | ICD-10-CM | POA: Diagnosis not present

## 2012-08-14 DIAGNOSIS — R5383 Other fatigue: Secondary | ICD-10-CM | POA: Diagnosis not present

## 2012-08-14 DIAGNOSIS — E119 Type 2 diabetes mellitus without complications: Secondary | ICD-10-CM | POA: Diagnosis not present

## 2012-08-14 DIAGNOSIS — R5381 Other malaise: Secondary | ICD-10-CM | POA: Diagnosis not present

## 2012-08-27 DIAGNOSIS — F209 Schizophrenia, unspecified: Secondary | ICD-10-CM | POA: Diagnosis not present

## 2012-09-07 ENCOUNTER — Encounter (HOSPITAL_COMMUNITY): Payer: Self-pay | Admitting: *Deleted

## 2012-09-07 ENCOUNTER — Emergency Department (HOSPITAL_COMMUNITY)
Admission: EM | Admit: 2012-09-07 | Discharge: 2012-09-07 | Disposition: A | Discharge status: Home / Self Care | Payer: Medicare Other | Attending: Emergency Medicine | Admitting: Emergency Medicine

## 2012-09-07 DIAGNOSIS — H6120 Impacted cerumen, unspecified ear: Secondary | ICD-10-CM | POA: Insufficient documentation

## 2012-09-07 DIAGNOSIS — H6690 Otitis media, unspecified, unspecified ear: Secondary | ICD-10-CM | POA: Insufficient documentation

## 2012-09-07 DIAGNOSIS — K219 Gastro-esophageal reflux disease without esophagitis: Secondary | ICD-10-CM | POA: Insufficient documentation

## 2012-09-07 DIAGNOSIS — H9190 Unspecified hearing loss, unspecified ear: Secondary | ICD-10-CM | POA: Diagnosis not present

## 2012-09-07 DIAGNOSIS — E785 Hyperlipidemia, unspecified: Secondary | ICD-10-CM | POA: Diagnosis not present

## 2012-09-07 DIAGNOSIS — Z79899 Other long term (current) drug therapy: Secondary | ICD-10-CM | POA: Diagnosis not present

## 2012-09-07 DIAGNOSIS — F17200 Nicotine dependence, unspecified, uncomplicated: Secondary | ICD-10-CM | POA: Insufficient documentation

## 2012-09-07 DIAGNOSIS — H9209 Otalgia, unspecified ear: Secondary | ICD-10-CM | POA: Diagnosis not present

## 2012-09-07 DIAGNOSIS — H6123 Impacted cerumen, bilateral: Secondary | ICD-10-CM

## 2012-09-07 DIAGNOSIS — F209 Schizophrenia, unspecified: Secondary | ICD-10-CM | POA: Diagnosis not present

## 2012-09-07 DIAGNOSIS — H65199 Other acute nonsuppurative otitis media, unspecified ear: Secondary | ICD-10-CM | POA: Diagnosis not present

## 2012-09-07 MED ORDER — CLINDAMYCIN HCL 150 MG PO CAPS: 300.0000 mg | ORAL_CAPSULE | Freq: Four times a day (QID) | ORAL | Status: DC

## 2012-09-07 NOTE — ED Notes (Signed)
Patient says she has had this problem before where her ears get clogged with wax and she has trouble hearing.  I looked in both her ears and the right ear is the one hurting her, has moderate amount of wax.  The left ear is not bothering her but she has a pustule looking bump draining yellow exudate.  Her left ear also has a moderate amount of wax build up.

## 2012-09-07 NOTE — ED Notes (Signed)
The pt is c/o ear wax build up in both ears for 3-4 days

## 2012-09-07 NOTE — ED Notes (Signed)
Patient is alert and orientedx4.  Patient was explained discharge instructions and they understood them with no questions.   

## 2012-09-07 NOTE — ED Provider Notes (Signed)
History    This chart was scribed for non-physician practitioner Raymon Mutton working with Carleene Cooper III, MD by Quintella Reichert, ED Scribe. This patient was seen in room TR10C/TR10C and the patient's care was started at 9:33 PM .   CSN: [id number]  Arrival date & time 09/07/12  1921       Chief Complaint  Patient presents with  . ear wax build up      The history is provided by the patient. No language interpreter was used.    Kim [name] is a 57 y.o. female who presents to the Emergency Department complaining of bilateral ear wax buildup with onset of 3 days ago, with accompanying moderate ear pain and muffled hearing.  Pt states that ear wax feels hard.  Pt denies fever, neck pain, sore throat, CP, SOB, abdominal pain, nausea, emesis, diarrhea, urinary symptoms, back pain, HA, weakness, numbness, dizziness or any other associated symptoms.      Past Medical History  Diagnosis Date  . GERD (gastroesophageal reflux disease)   . Schizophrenia   . Tobacco abuse   . Hyperlipemia     Past Surgical History  Procedure Laterality Date  . None      No family history on file.  History  Substance Use Topics  . Smoking status: Current Every Day Smoker -- 1.00 packs/day for 21 years    Types: Cigarettes  . Smokeless tobacco: Never Used  . Alcohol Use: No    OB History   Grav Para Term Preterm Abortions TAB SAB Ect Mult Living                  Review of Systems  Constitutional: Negative for fever.  HENT: Positive for hearing loss and ear pain. Negative for sore throat and neck pain.   Respiratory: Negative for shortness of breath.   Cardiovascular: Negative for chest pain.  Gastrointestinal: Negative for nausea, vomiting, abdominal pain and diarrhea.  Genitourinary: Negative for dysuria and difficulty urinating.  Musculoskeletal: Negative for back pain.  Neurological: Negative for dizziness, weakness, numbness and headaches.  All other systems reviewed and  are negative.     Allergies  Penicillins and Quetiapine  Home Medications   Current Outpatient Rx  Name  Route  Sig  Dispense  Refill  . albuterol (PROVENTIL HFA;VENTOLIN HFA) 108 (90 BASE) MCG/ACT inhaler   Inhalation   Inhale 2 puffs into the lungs every 6 (six) hours as needed for shortness of breath.          . cholecalciferol (VITAMIN D) 400 UNITS TABS   Oral   Take 400 Units by mouth daily.         . risperiDONE (RISPERDAL) 1 MG tablet   Oral   Take 1 mg by mouth daily.         . rosuvastatin (CRESTOR) 5 MG tablet   Oral   Take 5 mg by mouth daily.         . varenicline (CHANTIX PAK) 0.5 MG X 11 & 1 MG X 42 tablet   Oral   Take 0.5-1 mg by mouth 2 (two) times daily. Take one 0.5 mg tablet by mouth once daily for 3 days, then increase to one 0.5 mg tablet twice daily for 4 days, then increase to one 1 mg tablet twice daily.         . clindamycin (CLEOCIN) 150 MG capsule   Oral   Take 2 capsules (300 mg total) by mouth 4 (four)  times daily.   60 capsule   0     BP 130/76  Pulse 68  Temp(Src) 98.1 F (36.7 C) (Oral)  Resp 16  SpO2 95%  Physical Exam  Nursing note and vitals reviewed. Constitutional: She is oriented to person, place, and time. She appears well-developed and well-nourished. No distress.  HENT:  Head: Normocephalic and atraumatic.  Mouth/Throat: Oropharynx is clear and moist. No oropharyngeal exudate.  Positive cerumen impaction to both ears. No inflammation, swelling, erythema noted to outer ear or inner ear canal. Positive pustule to left ear that is actively draining pus.  Eyes: Conjunctivae and EOM are normal. Pupils are equal, round, and reactive to light. Right eye exhibits no discharge. Left eye exhibits no discharge.  Neck: Normal range of motion. Neck supple. No tracheal deviation present.  No lymphadenopathy  Cardiovascular: Normal rate, regular rhythm and normal heart sounds.   No murmur heard. Pulmonary/Chest: Effort  normal and breath sounds normal. No respiratory distress. She has no wheezes. She has no rales.  Lymphadenopathy:    She has no cervical adenopathy.  Neurological: She is alert and oriented to person, place, and time. She exhibits normal muscle tone. Coordination normal.  Skin: Skin is warm and dry.  Psychiatric: She has a normal mood and affect. Her behavior is normal. Thought content normal.     ED Course  Procedures (including critical care time)  DIAGNOSTIC STUDIES: Oxygen Saturation is 95% on room air, adequate by my interpretation.    COORDINATION OF CARE: 9:35 PM-Discussed treatment plan which includes ear flushing with pt at bedside and pt agreed to plan.      Labs Reviewed - No data to display No results found.   1. Cerumen impaction, bilateral   2. Otitis media, bilateral       MDM  Patient afebrile, normotensive, non-tachycardic, alert and oriented. Patient reported that it is normal for her ears to get filled with cerumen. Nurse flushed ears out to remove cerumen. I personally removed remaining cerumen with curette that would not come out with flushing - patient tolerated. Patient reported immediate relief after cerumen remoVal. After removal TM noted were erythematous and bulging, bilaterally, left more so than right ear. Patient septic, non-toxic appearing, in no acute distress. Suspected Otitis Media bilaterally. Discharged patient with antibiotics. Discussed with patient to follow-up with PCP, [facility] for re-check of ears. Discussed with patient to clean ears with warm water, soap and rag. Discussed with patient to monitor symptoms and if symptoms are to worsen or change please report back to the ED. Patient agreed to plan of care, understood, all questions answered.  I personally performed the services described in this documentation, which was scribed in my presence. The recorded information has been reviewed and is accurate.    Raymon Mutton,  PA-C 09/08/12 [phone]

## 2012-09-08 NOTE — ED Provider Notes (Signed)
Medical screening examination/treatment/procedure(s) were performed by non-physician practitioner and as supervising physician I was immediately available for consultation/collaboration.   Carleene Cooper III, MD 09/08/12 [phone]

## 2012-09-13 DIAGNOSIS — R7309 Other abnormal glucose: Secondary | ICD-10-CM | POA: Diagnosis not present

## 2012-09-13 DIAGNOSIS — R209 Unspecified disturbances of skin sensation: Secondary | ICD-10-CM | POA: Diagnosis not present

## 2012-09-16 DIAGNOSIS — E119 Type 2 diabetes mellitus without complications: Secondary | ICD-10-CM | POA: Diagnosis not present

## 2012-09-16 DIAGNOSIS — Z111 Encounter for screening for respiratory tuberculosis: Secondary | ICD-10-CM | POA: Diagnosis not present

## 2012-09-16 DIAGNOSIS — L84 Corns and callosities: Secondary | ICD-10-CM | POA: Diagnosis not present

## 2012-09-30 DIAGNOSIS — R0602 Shortness of breath: Secondary | ICD-10-CM | POA: Diagnosis not present

## 2012-11-14 DIAGNOSIS — F209 Schizophrenia, unspecified: Secondary | ICD-10-CM | POA: Diagnosis not present

## 2012-12-05 DIAGNOSIS — E119 Type 2 diabetes mellitus without complications: Secondary | ICD-10-CM | POA: Diagnosis not present

## 2012-12-05 DIAGNOSIS — Z111 Encounter for screening for respiratory tuberculosis: Secondary | ICD-10-CM | POA: Diagnosis not present

## 2012-12-05 DIAGNOSIS — F17200 Nicotine dependence, unspecified, uncomplicated: Secondary | ICD-10-CM | POA: Diagnosis not present

## 2012-12-05 DIAGNOSIS — F209 Schizophrenia, unspecified: Secondary | ICD-10-CM | POA: Diagnosis not present

## 2012-12-05 DIAGNOSIS — R209 Unspecified disturbances of skin sensation: Secondary | ICD-10-CM | POA: Diagnosis not present

## 2013-02-02 ENCOUNTER — Emergency Department (HOSPITAL_COMMUNITY)
Admission: EM | Admit: 2013-02-02 | Discharge: 2013-02-02 | Disposition: A | Discharge status: Home / Self Care | Payer: Medicare Other | Attending: Emergency Medicine | Admitting: Emergency Medicine

## 2013-02-02 ENCOUNTER — Encounter (HOSPITAL_COMMUNITY): Payer: Self-pay | Admitting: Nurse Practitioner

## 2013-02-02 DIAGNOSIS — E785 Hyperlipidemia, unspecified: Secondary | ICD-10-CM | POA: Insufficient documentation

## 2013-02-02 DIAGNOSIS — F17200 Nicotine dependence, unspecified, uncomplicated: Secondary | ICD-10-CM | POA: Diagnosis not present

## 2013-02-02 DIAGNOSIS — Z8719 Personal history of other diseases of the digestive system: Secondary | ICD-10-CM | POA: Insufficient documentation

## 2013-02-02 DIAGNOSIS — Z880 Allergy status to penicillin: Secondary | ICD-10-CM | POA: Diagnosis not present

## 2013-02-02 DIAGNOSIS — L84 Corns and callosities: Secondary | ICD-10-CM | POA: Insufficient documentation

## 2013-02-02 DIAGNOSIS — E7800 Pure hypercholesterolemia, unspecified: Secondary | ICD-10-CM | POA: Diagnosis not present

## 2013-02-02 DIAGNOSIS — F209 Schizophrenia, unspecified: Secondary | ICD-10-CM | POA: Insufficient documentation

## 2013-02-02 DIAGNOSIS — Z79899 Other long term (current) drug therapy: Secondary | ICD-10-CM | POA: Diagnosis not present

## 2013-02-02 HISTORY — DX: Pure hypercholesterolemia, unspecified: E78.00

## 2013-02-02 MED ORDER — AMMONIUM LACTATE 12 % EX LOTN: TOPICAL_LOTION | CUTANEOUS | Status: DC | PRN

## 2013-02-02 MED ORDER — UREA 10 % EX CREA: TOPICAL_CREAM | CUTANEOUS | Status: DC | PRN

## 2013-02-02 NOTE — ED Notes (Signed)
C/o callous to bottom of R foot that is getting bigger over past week and will not respond to OTC callous creams

## 2013-02-02 NOTE — ED Provider Notes (Signed)
CSN: [id number]     Arrival date & time 02/02/13  1517 History  This chart was scribed for non-physician practitioner, Junious Silk PA-C working with Audree Camel, MD by Arlan Organ, ED Scribe. This patient was seen in room TR06C/TR06C and the patient's care was started at 1540.   Chief Complaint  Patient presents with  . Foot Pain    The history is provided by the patient. No language interpreter was used.   HPI Comments: Kim [name] is a 57 y.o. female who presents to the Emergency Department complaining of right foot pain that started a week and a half ago. Pt describes the pain as sharp and shooting, and states the pain worsens when she stands. Pt has tried an salicylic  acid ointment for the last 3 days to dissolve the callous with mild relief. Pt has hx of "very high blood sugar", but does not believe she has diabetes. She is currently trying lifestyle modifications, but drank soda today.    PCP- [facility]  Past Medical History  Diagnosis Date  . GERD (gastroesophageal reflux disease)   . Schizophrenia   . Tobacco abuse   . Hyperlipemia   . Hypercholesteremia    Past Surgical History  Procedure Laterality Date  . None     History reviewed. No pertinent family history. History  Substance Use Topics  . Smoking status: Current Every Day Smoker -- 1.00 packs/day for 21 years    Types: Cigarettes  . Smokeless tobacco: Never Used  . Alcohol Use: No   OB History   Grav Para Term Preterm Abortions TAB SAB Ect Mult Living                 Review of Systems  A complete 10 system review of systems was obtained and all systems are negative except as noted in the HPI and PMH.   Allergies  Penicillins and Quetiapine  Home Medications   Current Outpatient Rx  Name  Route  Sig  Dispense  Refill  . albuterol (PROVENTIL HFA;VENTOLIN HFA) 108 (90 BASE) MCG/ACT inhaler   Inhalation   Inhale 2 puffs into the lungs every 6 (six) hours as needed for  shortness of breath.          . cholecalciferol (VITAMIN D) 400 UNITS TABS   Oral   Take 400 Units by mouth daily.         . risperiDONE (RISPERDAL) 1 MG tablet   Oral   Take 1.5 mg by mouth daily.          . rosuvastatin (CRESTOR) 5 MG tablet   Oral   Take 5 mg by mouth daily.          BP 144/104  Pulse 74  Temp(Src) 97.7 F (36.5 C) (Oral)  Resp 16  Ht 5\' 4"  (1.626 m)  Wt 185 lb (83.915 kg)  BMI 31.74 kg/m2  SpO2 99% Physical Exam  Nursing note and vitals reviewed. Constitutional: She is oriented to person, place, and time. She appears well-developed and well-nourished. No distress.  HENT:  Head: Normocephalic and atraumatic.  Right Ear: External ear normal.  Left Ear: External ear normal.  Nose: Nose normal.  Mouth/Throat: Oropharynx is clear and moist.  Eyes: Conjunctivae are normal.  Neck: Normal range of motion.  Cardiovascular: Normal rate, regular rhythm, normal heart sounds, intact distal pulses and normal pulses.   Cap refill <3 seconds on all toes  Pulmonary/Chest: Effort normal and breath sounds normal. No  stridor. No respiratory distress. She has no wheezes. She has no rales.  Abdominal: Soft. She exhibits no distension.  Musculoskeletal: Normal range of motion.  Neurological: She is alert and oriented to person, place, and time. She has normal strength.  Skin: Skin is warm and dry. She is not diaphoretic. No erythema.  3 cm callus on ball of right foot. No surrounding erythema, warmth, streaking. No drainage.   Psychiatric: She has a normal mood and affect. Her behavior is normal.    ED Course  Procedures (including critical care time)  DIAGNOSTIC STUDIES: Oxygen Saturation is 99% on RA, Normal by my interpretation.    COORDINATION OF CARE: 4:48 PM-Discussed treatment plan which includes foot cream. Pt agreed to plan.     Labs Review Labs Reviewed - No data to display Imaging Review No results found.  MDM   1. Callus    Patient  presents with callus to sole of right foot which has not responded to salicylic acid. Made cushion for foot and gave rx for cream. Follow up with PCP. Neurovascularly intact. Return instructions given. Vital signs stable for discharge. Patient / Family / Caregiver informed of clinical course, understand medical decision-making process, and agree with plan.   I personally performed the services described in this documentation, which was scribed in my presence. The recorded information has been reviewed and is accurate.     Mora Bellman, PA-C 02/02/13 [phone]

## 2013-02-03 NOTE — ED Provider Notes (Signed)
Medical screening examination/treatment/procedure(s) were performed by non-physician practitioner and as supervising physician I was immediately available for consultation/collaboration.   Audree Camel, MD 02/03/13 [phone]

## 2013-04-03 DIAGNOSIS — F209 Schizophrenia, unspecified: Secondary | ICD-10-CM | POA: Diagnosis not present

## 2013-05-29 DIAGNOSIS — F209 Schizophrenia, unspecified: Secondary | ICD-10-CM | POA: Diagnosis not present

## 2013-08-29 DIAGNOSIS — F209 Schizophrenia, unspecified: Secondary | ICD-10-CM | POA: Diagnosis not present

## 2013-09-11 DIAGNOSIS — H6120 Impacted cerumen, unspecified ear: Secondary | ICD-10-CM | POA: Diagnosis not present

## 2013-09-18 DIAGNOSIS — H6120 Impacted cerumen, unspecified ear: Secondary | ICD-10-CM | POA: Diagnosis not present

## 2013-09-30 DIAGNOSIS — J040 Acute laryngitis: Secondary | ICD-10-CM | POA: Diagnosis not present

## 2013-10-08 DIAGNOSIS — E119 Type 2 diabetes mellitus without complications: Secondary | ICD-10-CM | POA: Diagnosis not present

## 2013-10-08 DIAGNOSIS — R0602 Shortness of breath: Secondary | ICD-10-CM | POA: Diagnosis not present

## 2013-10-08 DIAGNOSIS — R609 Edema, unspecified: Secondary | ICD-10-CM | POA: Diagnosis not present

## 2013-10-21 ENCOUNTER — Ambulatory Visit: Payer: Self-pay | Admitting: Physician Assistant

## 2013-10-21 VITALS — BP 116/72 | HR 80 | Temp 97.9°F | Resp 16 | Ht 64.3 in | Wt 208.8 lb

## 2013-10-21 DIAGNOSIS — Z719 Counseling, unspecified: Secondary | ICD-10-CM

## 2013-10-21 NOTE — Progress Notes (Signed)
Subjective:    Patient ID: Kim [name], female    DOB: 08/18/1955, 58 y.o.   MRN: [id number]  HPI   Kim [name] is a pleasant 58 yr old female here requesting DMV paperwork to be completed.  She brings a letter with her that was dated in 2004.  The letter states that forms must be completed within 30 days of the date on the letter.  Pt reports that she has spoken with the [facility] and that they will still accept this form.  She reports she has not been driving for the last 11 years.  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  She reports she is followed by [facility] mental health for this.  Takes 2mg  risperdal nightly.  Reports that she takes this consistently.  Onset of disease in 1981.  Pt reports last hospitalization was in 2003 or 2004.  Pt reports no other medical problems.  She reports she was told she needed to have the vision portion of the form completed.  She wears reading glasses only and has never had corrective lenses   Review of Systems  Constitutional: Negative.   Eyes: Negative.   Respiratory: Negative.   Cardiovascular: Negative.   Psychiatric/Behavioral: Negative for suicidal ideas, hallucinations and dysphoric mood.       Objective:   Physical Exam  Vitals reviewed. Constitutional: She is oriented to person, place, and time. She appears well-developed and well-nourished. No distress.  HENT:  Head: Normocephalic and atraumatic.  Eyes: Conjunctivae and EOM are normal. Pupils are equal, round, and reactive to light. No scleral icterus.  Cardiovascular: Normal rate, regular rhythm and normal heart sounds.   Pulmonary/Chest: Effort normal and breath sounds normal. She has no wheezes. She has no rales.  Neurological: She is alert and oriented to person, place, and time.  Skin: Skin is warm and dry.  Psychiatric: She has a normal mood and affect. Her speech is normal and behavior is normal. Thought content normal.    Visual Acuity Screening   Right eye Left eye Both eyes  Without correction: 20/30 20/30 20/30   With correction:          Assessment & Plan:  Counseling NOS   Kim [name] is a very pleasant 58 yr old female here for completion of DMV paperwork.  The paperwork is dated 2004, and I am not sure that it will suffice to reinstate her license.  I have discussed this with the patient, but she is under the impression that she is still able to turn in this set of forms.  She reports her vision needed to be evaluated.  Her vision is 20/30 in each eye and she does not wear corrective lenses.  I do not think she needs specialist evaluation of this and have written her a letter stating this.  Unfortunately I cannot clear her to return to driving.  She will need clearance from her psychiatrist.  The patient is new to our practice and I am unable to state whether or not she is well controlled and compliant with medication.  I have discussed this with the patient.  She was initially upset about this, but she was under the impression that she needed a full physical exam to be completed.  I explained that she only requires the part of the exam that initially disqualified her from driving.  She understands that the "mental health" section of the form is the only one that needs to be completed, and she understands  that her psychiatrist needs to be the one to complete this.  I have not charged pt the full $150 for completion of DMV paperwork as I am not able to complete it for her   E. Natividad Brood MHS, PA-C [facility] 6/3/201510:02 PM

## 2013-11-05 DIAGNOSIS — R609 Edema, unspecified: Secondary | ICD-10-CM | POA: Diagnosis not present

## 2013-11-05 DIAGNOSIS — E119 Type 2 diabetes mellitus without complications: Secondary | ICD-10-CM | POA: Diagnosis not present

## 2013-11-11 ENCOUNTER — Telehealth: Payer: Self-pay

## 2013-11-11 NOTE — Telephone Encounter (Signed)
Patient called and left a voicemail asking if her records were ready for pick-up. I returned her call and informed told her I would look for the records. Placed call on hold - call dropped. Tried to call patient back on home phone but the call would not go through. Left a message on patient's cell phone that her records are ready for pick-up and to call us if she had any questions.

## 2013-11-14 ENCOUNTER — Ambulatory Visit: Payer: BC Managed Care – PPO | Admitting: Family Medicine

## 2013-11-21 ENCOUNTER — Ambulatory Visit (INDEPENDENT_AMBULATORY_CARE_PROVIDER_SITE_OTHER): Payer: BC Managed Care – PPO | Admitting: Physician Assistant

## 2013-11-21 VITALS — BP 124/84 | HR 66 | Temp 98.5°F | Resp 18 | Ht 65.0 in | Wt 210.4 lb

## 2013-11-21 DIAGNOSIS — Z0289 Encounter for other administrative examinations: Secondary | ICD-10-CM

## 2013-11-21 NOTE — Progress Notes (Signed)
Subjective:    Patient ID: Kim [name], female    DOB: Aug 26, 1955, 58 y.o.   MRN: [id number]  HPI   Kim [name] is a very pleasant 58 yr old female who is here for a DMV physical.  See previous note from 10/21/13 when we discussed this initially. At that time, she had ppw dated 2004 that was no longer valid.  Additionally, I encouraged pt to have her psychiatrist at [facility] complete the paperwork as she lost her license due to schizophrenia  Pt reports that [facility] would not complete her paperwork.  Pt also states that her PCP [facility] told her they "don't do DMV forms."  She was told by her PCP to come here  Pt's first visit to our office was 10/21/13.  She has not been treated by any of our providers.    She does bring her [place] records with her today.  Records indicate that she is well controlled on risperdol 2mg  QHS.  She has been at this dose for some time now.  She lives in a group home with a caregiver who ensures that she takes her medication faithfully.  Last visit to [facility] April 2015.  Pt reports she follows there every 3 months  Pt reports schizophrenia onset 1981.  She reports taking many medications but being on risperdol since 2006.  She takes 2mg  QHS.  Symptoms appear to be well controlled.  The medication "relaxes me" pt reports.  She denies depressive feelings.  She denis SI or HI.  When asked if she experiences auditory or visual hallucinations she states, "that's what the doctor said."  She does not feel that she ever really hallucinated.  She admits that "sometimes I hear someone calling my name, but there's no one there" - she reports this has not happened in 3 yrs.  She was last hospitalized in 2003 - she believes this was at [facility] [place] for about 3 months.  She reports she had follow up here in [place] at [facility].  She has not been driving since 0160.  She is planning to get a job and go back to school to study medical assisting.  She anticipates  needing to drive to and from those places.  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       Objective:   Physical Exam  Vitals reviewed. Constitutional: She is oriented to person, place, and time. She appears well-developed and well-nourished. No distress.  HENT:  Head: Normocephalic and atraumatic.  Pulmonary/Chest: Effort normal.  Neurological: She is alert and oriented to person, place, and time.  Skin: Skin is warm and dry.  Psychiatric: She has a normal mood and affect. Her speech is normal and behavior is normal. Judgment and thought content normal.       Assessment & Plan:  Health examination of defined subpopulation   Kim [name] is a pleasant 58 yr old female here for [facility] paperwork completion.  She reports that neither her PCP, nor [facility] would complete her paperwork.  She has brought records from [facility] with her.  Based on these, she appears to be well controlled on her current medication.  She has a caregiver that helps her take her medication.  Mood, affect, behavior, thought content all appear normal on exam.  I do feel like she is safe to pursue reinstating her driver license.  I do think she needs periodic follow up (q3 months at [facility]) as well as a driving test as she has not driven in 11 years.  I have completed her ppw stating this.  I have made a copy of ppw for pt and to be scanned into the chart.  The originals have been mailed to the [facility].    Of note, I did try to contact [facility] but they were closed for the holiday.  Will contact them Monday 11/24/13 to make them aware that I have completed ppw and get their input   E. Natividad Brood MHS, PA-C [facility] 7/3/20156:57 PM

## 2013-11-24 DIAGNOSIS — E119 Type 2 diabetes mellitus without complications: Secondary | ICD-10-CM | POA: Diagnosis not present

## 2013-11-24 DIAGNOSIS — R609 Edema, unspecified: Secondary | ICD-10-CM | POA: Diagnosis not present

## 2013-11-25 ENCOUNTER — Telehealth: Payer: Self-pay | Admitting: Physician Assistant

## 2013-11-25 NOTE — Telephone Encounter (Signed)
LM for [name] (pt's PCP) at [facility] to discuss [facility] paperwork

## 2013-11-25 NOTE — Telephone Encounter (Signed)
Spoke with [name] at [facility] (pt's PCP) regarding DMV paperwork.  She agrees that pt is stable and safe to pursue reinstating her driver's license

## 2013-11-27 DIAGNOSIS — F209 Schizophrenia, unspecified: Secondary | ICD-10-CM | POA: Diagnosis not present

## 2013-12-01 ENCOUNTER — Telehealth: Payer: Self-pay

## 2013-12-01 NOTE — Telephone Encounter (Signed)
Pt states seen last week for a DMV physical, she was advised this was mailed to the [facility], but per pt it has not been received by the [facility]. Pt wants to verify this was mailed and the date it was sent out

## 2013-12-02 NOTE — Telephone Encounter (Signed)
Yes it should have been mailed the day she came for the physical - Junie Panning I think I handed this off to you, did you put it in the mail?  Unfortunately our mail takes a long time to go out since it has to go through [facility].  Forms were sent for scanning as well, so we should be able to print duplicate copies if needed once those appear in Epic

## 2013-12-02 NOTE — Telephone Encounter (Signed)
lmom for pt to cb

## 2013-12-02 NOTE — Telephone Encounter (Signed)
Benjamine Mola, do you know if these forms were mailed.

## 2013-12-03 DIAGNOSIS — F209 Schizophrenia, unspecified: Secondary | ICD-10-CM | POA: Diagnosis not present

## 2013-12-03 NOTE — Telephone Encounter (Signed)
lmom that I faxed form to dmv

## 2013-12-05 DIAGNOSIS — E119 Type 2 diabetes mellitus without complications: Secondary | ICD-10-CM | POA: Diagnosis not present

## 2013-12-05 DIAGNOSIS — R609 Edema, unspecified: Secondary | ICD-10-CM | POA: Diagnosis not present

## 2013-12-05 DIAGNOSIS — L84 Corns and callosities: Secondary | ICD-10-CM | POA: Diagnosis not present

## 2013-12-26 DIAGNOSIS — Z79899 Other long term (current) drug therapy: Secondary | ICD-10-CM | POA: Diagnosis not present

## 2013-12-26 DIAGNOSIS — E119 Type 2 diabetes mellitus without complications: Secondary | ICD-10-CM | POA: Diagnosis not present

## 2013-12-26 DIAGNOSIS — R609 Edema, unspecified: Secondary | ICD-10-CM | POA: Diagnosis not present

## 2013-12-26 DIAGNOSIS — F209 Schizophrenia, unspecified: Secondary | ICD-10-CM | POA: Diagnosis not present

## 2014-02-03 ENCOUNTER — Emergency Department (INDEPENDENT_AMBULATORY_CARE_PROVIDER_SITE_OTHER)
Admission: EM | Admit: 2014-02-03 | Discharge: 2014-02-03 | Disposition: A | Discharge status: Nursing Home (Includes ICF) | Payer: Medicare Other | Source: Home / Self Care | Attending: Family Medicine | Admitting: Family Medicine

## 2014-02-03 ENCOUNTER — Encounter (HOSPITAL_COMMUNITY): Payer: Self-pay | Admitting: Emergency Medicine

## 2014-02-03 ENCOUNTER — Emergency Department (HOSPITAL_COMMUNITY): Payer: Medicare Other

## 2014-02-03 ENCOUNTER — Inpatient Hospital Stay (HOSPITAL_COMMUNITY)
Admission: EM | Admit: 2014-02-03 | Discharge: 2014-02-10 | DRG: 286 | Disposition: A | Discharge status: Home Health | Payer: Medicare Other | Attending: Pulmonary Disease | Admitting: Pulmonary Disease

## 2014-02-03 DIAGNOSIS — R4020 Unspecified coma: Secondary | ICD-10-CM | POA: Diagnosis not present

## 2014-02-03 DIAGNOSIS — R404 Transient alteration of awareness: Secondary | ICD-10-CM

## 2014-02-03 DIAGNOSIS — R609 Edema, unspecified: Secondary | ICD-10-CM

## 2014-02-03 DIAGNOSIS — R0989 Other specified symptoms and signs involving the circulatory and respiratory systems: Secondary | ICD-10-CM | POA: Diagnosis not present

## 2014-02-03 DIAGNOSIS — I2510 Atherosclerotic heart disease of native coronary artery without angina pectoris: Secondary | ICD-10-CM | POA: Diagnosis present

## 2014-02-03 DIAGNOSIS — Z452 Encounter for adjustment and management of vascular access device: Secondary | ICD-10-CM | POA: Diagnosis not present

## 2014-02-03 DIAGNOSIS — J9690 Respiratory failure, unspecified, unspecified whether with hypoxia or hypercapnia: Secondary | ICD-10-CM | POA: Diagnosis present

## 2014-02-03 DIAGNOSIS — R4182 Altered mental status, unspecified: Secondary | ICD-10-CM | POA: Diagnosis present

## 2014-02-03 DIAGNOSIS — F17200 Nicotine dependence, unspecified, uncomplicated: Secondary | ICD-10-CM | POA: Diagnosis present

## 2014-02-03 DIAGNOSIS — I5033 Acute on chronic diastolic (congestive) heart failure: Secondary | ICD-10-CM | POA: Diagnosis not present

## 2014-02-03 DIAGNOSIS — G9340 Encephalopathy, unspecified: Secondary | ICD-10-CM | POA: Diagnosis not present

## 2014-02-03 DIAGNOSIS — J9600 Acute respiratory failure, unspecified whether with hypoxia or hypercapnia: Secondary | ICD-10-CM | POA: Diagnosis not present

## 2014-02-03 DIAGNOSIS — Z23 Encounter for immunization: Secondary | ICD-10-CM | POA: Diagnosis not present

## 2014-02-03 DIAGNOSIS — F209 Schizophrenia, unspecified: Secondary | ICD-10-CM | POA: Diagnosis present

## 2014-02-03 DIAGNOSIS — E785 Hyperlipidemia, unspecified: Secondary | ICD-10-CM | POA: Diagnosis present

## 2014-02-03 DIAGNOSIS — R0902 Hypoxemia: Secondary | ICD-10-CM

## 2014-02-03 DIAGNOSIS — J90 Pleural effusion, not elsewhere classified: Secondary | ICD-10-CM

## 2014-02-03 DIAGNOSIS — J9602 Acute respiratory failure with hypercapnia: Secondary | ICD-10-CM

## 2014-02-03 DIAGNOSIS — R079 Chest pain, unspecified: Secondary | ICD-10-CM | POA: Diagnosis not present

## 2014-02-03 DIAGNOSIS — E119 Type 2 diabetes mellitus without complications: Secondary | ICD-10-CM | POA: Diagnosis present

## 2014-02-03 DIAGNOSIS — K219 Gastro-esophageal reflux disease without esophagitis: Secondary | ICD-10-CM | POA: Diagnosis present

## 2014-02-03 DIAGNOSIS — R0602 Shortness of breath: Secondary | ICD-10-CM | POA: Diagnosis not present

## 2014-02-03 DIAGNOSIS — Z880 Allergy status to penicillin: Secondary | ICD-10-CM | POA: Diagnosis not present

## 2014-02-03 DIAGNOSIS — J9620 Acute and chronic respiratory failure, unspecified whether with hypoxia or hypercapnia: Secondary | ICD-10-CM | POA: Diagnosis not present

## 2014-02-03 DIAGNOSIS — I517 Cardiomegaly: Secondary | ICD-10-CM | POA: Diagnosis not present

## 2014-02-03 DIAGNOSIS — R059 Cough, unspecified: Secondary | ICD-10-CM

## 2014-02-03 DIAGNOSIS — I509 Heart failure, unspecified: Secondary | ICD-10-CM | POA: Diagnosis not present

## 2014-02-03 DIAGNOSIS — R600 Localized edema: Secondary | ICD-10-CM

## 2014-02-03 DIAGNOSIS — R0603 Acute respiratory distress: Secondary | ICD-10-CM

## 2014-02-03 DIAGNOSIS — Z79899 Other long term (current) drug therapy: Secondary | ICD-10-CM

## 2014-02-03 DIAGNOSIS — I209 Angina pectoris, unspecified: Secondary | ICD-10-CM | POA: Diagnosis present

## 2014-02-03 DIAGNOSIS — E872 Acidosis, unspecified: Secondary | ICD-10-CM | POA: Diagnosis present

## 2014-02-03 DIAGNOSIS — J9819 Other pulmonary collapse: Secondary | ICD-10-CM | POA: Diagnosis not present

## 2014-02-03 DIAGNOSIS — I5031 Acute diastolic (congestive) heart failure: Secondary | ICD-10-CM | POA: Diagnosis not present

## 2014-02-03 DIAGNOSIS — R05 Cough: Secondary | ICD-10-CM

## 2014-02-03 DIAGNOSIS — I498 Other specified cardiac arrhythmias: Secondary | ICD-10-CM | POA: Diagnosis present

## 2014-02-03 DIAGNOSIS — J441 Chronic obstructive pulmonary disease with (acute) exacerbation: Secondary | ICD-10-CM | POA: Diagnosis present

## 2014-02-03 DIAGNOSIS — Z720 Tobacco use: Secondary | ICD-10-CM

## 2014-02-03 DIAGNOSIS — R0609 Other forms of dyspnea: Secondary | ICD-10-CM | POA: Diagnosis not present

## 2014-02-03 DIAGNOSIS — R9431 Abnormal electrocardiogram [ECG] [EKG]: Secondary | ICD-10-CM

## 2014-02-03 DIAGNOSIS — I959 Hypotension, unspecified: Secondary | ICD-10-CM | POA: Diagnosis present

## 2014-02-03 DIAGNOSIS — Z4682 Encounter for fitting and adjustment of non-vascular catheter: Secondary | ICD-10-CM | POA: Diagnosis not present

## 2014-02-03 DIAGNOSIS — J9601 Acute respiratory failure with hypoxia: Secondary | ICD-10-CM

## 2014-02-03 HISTORY — DX: Type 2 diabetes mellitus without complications: E11.9

## 2014-02-03 HISTORY — DX: Heart failure, unspecified: I50.9

## 2014-02-03 LAB — I-STAT ARTERIAL BLOOD GAS, ED
Acid-Base Excess: 1 mmol/L (ref 0.0–2.0)
Bicarbonate: 31.7 mEq/L — ABNORMAL HIGH (ref 20.0–24.0)
O2 SAT: 93 %
PCO2 ART: 71.3 mmHg — AB (ref 35.0–45.0)
PO2 ART: 79 mmHg — AB (ref 80.0–100.0)
Patient temperature: 98.7
TCO2: 34 mmol/L (ref 0–100)
pH, Arterial: 7.256 — ABNORMAL LOW (ref 7.350–7.450)

## 2014-02-03 LAB — CBC
HCT: 49.8 % — ABNORMAL HIGH (ref 36.0–46.0)
Hemoglobin: 17 g/dL — ABNORMAL HIGH (ref 12.0–15.0)
MCH: 32.6 pg (ref 26.0–34.0)
MCHC: 34.1 g/dL (ref 30.0–36.0)
MCV: 95.4 fL (ref 78.0–100.0)
Platelets: 198 10*3/uL (ref 150–400)
RBC: 5.22 MIL/uL — ABNORMAL HIGH (ref 3.87–5.11)
RDW: 13 % (ref 11.5–15.5)
WBC: 10.5 10*3/uL (ref 4.0–10.5)

## 2014-02-03 LAB — BASIC METABOLIC PANEL
ANION GAP: 11 (ref 5–15)
BUN: 8 mg/dL (ref 6–23)
CALCIUM: 9.4 mg/dL (ref 8.4–10.5)
CHLORIDE: 93 meq/L — AB (ref 96–112)
CO2: 29 mEq/L (ref 19–32)
CREATININE: 0.56 mg/dL (ref 0.50–1.10)
GFR calc non Af Amer: >90 mL/min (ref >90)
Glucose, Bld: 99 mg/dL (ref 70–99)
Potassium: 4.5 mEq/L (ref 3.7–5.3)
SODIUM: 133 meq/L — AB (ref 137–147)

## 2014-02-03 LAB — TROPONIN I

## 2014-02-03 LAB — PRO B NATRIURETIC PEPTIDE: PRO B NATRI PEPTIDE: 2600 pg/mL — AB (ref 0–125)

## 2014-02-03 MED ORDER — METHYLPREDNISOLONE SODIUM SUCC 125 MG IJ SOLR: 125.0000 mg | Freq: Once | INTRAMUSCULAR | Status: DC

## 2014-02-03 MED ORDER — ALBUTEROL SULFATE (2.5 MG/3ML) 0.083% IN NEBU
5.0000 mg | INHALATION_SOLUTION | Freq: Once | RESPIRATORY_TRACT | Status: AC
  Administered 2014-02-03: 5 mg via RESPIRATORY_TRACT
  Filled 2014-02-03: qty 6

## 2014-02-03 MED ORDER — ALBUTEROL SULFATE (2.5 MG/3ML) 0.083% IN NEBU
2.5000 mg | INHALATION_SOLUTION | Freq: Once | RESPIRATORY_TRACT | Status: AC
  Administered 2014-02-03: 2.5 mg via RESPIRATORY_TRACT

## 2014-02-03 MED ORDER — ALBUTEROL (5 MG/ML) CONTINUOUS INHALATION SOLN
10.0000 mg/h | INHALATION_SOLUTION | RESPIRATORY_TRACT | Status: AC
  Administered 2014-02-03: 10 mg/h via RESPIRATORY_TRACT
  Filled 2014-02-03: qty 20

## 2014-02-03 MED ORDER — METHYLPREDNISOLONE SODIUM SUCC 125 MG IJ SOLR
125.0000 mg | Freq: Once | INTRAMUSCULAR | Status: AC
  Administered 2014-02-03: 125 mg via INTRAMUSCULAR

## 2014-02-03 MED ORDER — ALBUTEROL SULFATE (2.5 MG/3ML) 0.083% IN NEBU
INHALATION_SOLUTION | RESPIRATORY_TRACT | Status: AC
  Filled 2014-02-03: qty 3

## 2014-02-03 MED ORDER — METHYLPREDNISOLONE SODIUM SUCC 125 MG IJ SOLR
INTRAMUSCULAR | Status: AC
  Filled 2014-02-03: qty 2

## 2014-02-03 MED ORDER — IPRATROPIUM BROMIDE 0.02 % IN SOLN
0.5000 mg | RESPIRATORY_TRACT | Status: AC
  Administered 2014-02-03: 0.5 mg via RESPIRATORY_TRACT
  Filled 2014-02-03: qty 2.5

## 2014-02-03 MED ORDER — SODIUM CHLORIDE 0.9 % IJ SOLN
INTRAMUSCULAR | Status: AC
  Filled 2014-02-03: qty 3

## 2014-02-03 MED ORDER — ASPIRIN 81 MG PO CHEW
324.0000 mg | CHEWABLE_TABLET | Freq: Once | ORAL | Status: AC
  Administered 2014-02-03: 324 mg via ORAL
  Filled 2014-02-03: qty 4

## 2014-02-03 MED ORDER — IPRATROPIUM-ALBUTEROL 0.5-2.5 (3) MG/3ML IN SOLN
RESPIRATORY_TRACT | Status: AC
  Filled 2014-02-03: qty 3

## 2014-02-03 MED ORDER — IPRATROPIUM-ALBUTEROL 0.5-2.5 (3) MG/3ML IN SOLN
3.0000 mL | Freq: Once | RESPIRATORY_TRACT | Status: AC
  Administered 2014-02-03: 3 mL via RESPIRATORY_TRACT

## 2014-02-03 MED ORDER — FUROSEMIDE 10 MG/ML IJ SOLN
40.0000 mg | INTRAMUSCULAR | Status: AC
  Administered 2014-02-03: 40 mg via INTRAVENOUS
  Filled 2014-02-03: qty 4

## 2014-02-03 MED ORDER — NITROGLYCERIN 2 % TD OINT
0.5000 [in_us] | TOPICAL_OINTMENT | Freq: Once | TRANSDERMAL | Status: AC
  Administered 2014-02-03: 0.5 [in_us] via TOPICAL
  Filled 2014-02-03: qty 1

## 2014-02-03 NOTE — ED Notes (Signed)
[facility] here before saline lock could be done. They said they will do it.  Report given.  Caretaker notified of transfer.

## 2014-02-03 NOTE — ED Provider Notes (Signed)
CSN: [id number]     Arrival date & time 02/03/14  1704 History   First MD Initiated Contact with Patient 02/03/14 1759     Chief Complaint  Patient presents with  . Shortness of Breath   (Consider location/radiation/quality/duration/timing/severity/associated sxs/prior Treatment) HPI  SOB: Started a couple days ago. Maybe from weather change. Smokes 1/2-1ppg. H/o thoracentesis a couple years ago for unknown reasons (several pints of fluid removed.). Stopped taking fluid pill several days ago because she didn't want to take it anymore. (Lasix 20). Developing a productive cough and fever during this time. Has not used albuterol inhaler for several months. Increasing orthopbnea to 2-3 recently and increasing LE edema.      Past Medical History  Diagnosis Date  . GERD (gastroesophageal reflux disease)   . Schizophrenia   . Tobacco abuse   . Hyperlipemia   . Hypercholesteremia    Past Surgical History  Procedure Laterality Date  . None     History reviewed. No pertinent family history. History  Substance Use Topics  . Smoking status: Current Every Day Smoker -- 1.00 packs/day for 21 years    Types: Cigarettes  . Smokeless tobacco: Never Used     Comment: 10 up to a pack- 7 today   . Alcohol Use: No   OB History   Grav Para Term Preterm Abortions TAB SAB Ect Mult Living                 Review of Systems Per HPI with all other pertinent systems negative.   Allergies  Penicillins and Quetiapine  Home Medications   Prior to Admission medications   Medication Sig Start Date End Date Taking? Authorizing Provider  cholecalciferol (VITAMIN D) 400 UNITS TABS Take 400 Units by mouth daily.   Yes Historical Provider, MD  furosemide (LASIX) 20 MG tablet Take 20 mg by mouth daily as needed.   Yes Historical Provider, MD  metFORMIN (GLUCOPHAGE-XR) 750 MG 24 hr tablet Take 750 mg by mouth daily with breakfast.   Yes Historical Provider, MD  risperiDONE (RISPERDAL) 1 MG tablet Take 2  mg by mouth at bedtime.    Yes Historical Provider, MD  rosuvastatin (CRESTOR) 5 MG tablet Take 5 mg by mouth daily.   Yes Historical Provider, MD  albuterol (PROVENTIL HFA;VENTOLIN HFA) 108 (90 BASE) MCG/ACT inhaler Inhale 2 puffs into the lungs every 6 (six) hours as needed for shortness of breath.     Historical Provider, MD   BP 113/78  Pulse 80  Temp(Src) 99.4 F (37.4 C) (Oral)  Resp 18  SpO2 90% Physical Exam  Constitutional: She is oriented to person, place, and time. She appears well-developed and well-nourished. No distress.  HENT:  Head: Normocephalic and atraumatic.  Eyes: EOM are normal. Pupils are equal, round, and reactive to light.  Neck: Normal range of motion. Neck supple.  Cardiovascular: Normal rate, normal heart sounds and intact distal pulses.   Pulmonary/Chest:  Increased WOB, wheezing and ronchi throughout. Diminished breath sounds in the bases bilat.   Abdominal: Soft. She exhibits no distension.  Musculoskeletal:  2+ LE edema bilat  Neurological: She is alert and oriented to person, place, and time.  Skin: Skin is warm and dry. She is not diaphoretic.  Psychiatric: Judgment and thought content normal.    ED Course  Procedures (including critical care time) Labs Review Labs Reviewed - No data to display  Imaging Review No results found.   MDM   1. Shortness of breath  2. Bilateral edema of lower extremity   3. Hypoxemia   4. Tobacco abuse    Pt presenting w/ symptoms and hx worriesome for COPD exacerbation vs PNA vs CHF exacerbation. Unable to fully care for pt at [facility] due to lack of Xray tonight and clinic closure in less than 90 min. Pt given duoneb and solumedrol 125mg  IM w/ improvement. Transported to [facility] by transport.  Likely to need overnigth Obs admission vs diuresis and further workup and treatments in ED prior to discharge.    Linna Darner, MD Family Medicine 02/03/2014, 6:41 PM    Waldemar Dickens, MD 02/03/14 [phone]

## 2014-02-03 NOTE — Progress Notes (Signed)
RT in room to start continuous neb. Patient leaving room for xray.

## 2014-02-03 NOTE — ED Notes (Signed)
Pt still with accessory muscle usage.  Increased effort to breath.  Pt sating 89-91% on 5L.  Pt to be placed on BiPap.

## 2014-02-03 NOTE — ED Notes (Signed)
Pt from Urgent Care with shortness of breath and weakness for the past 3 days.  Pt has a hx of CHF and has not taken her lasix for the past 4 days due to being too weak to walk to restroom.  Rhonchi auscultated in lungs.  Pt currently sating 93% on 5 L.  Pt got a dose of solu-medrol, albuterol and atrovent PTA.  Edema to lower extremity.

## 2014-02-03 NOTE — ED Notes (Addendum)
C/o sneezing and coughing onset 2 days ago.  Today chest congestion and a difficulty breathing.  Cough prod. Of yellowish white sputum.  She said she has had a little fever but has not checked it.  Stays in group home [facility] on [address].  Trying Robitussin for runny nose and cough with little relief.  She thinks she has fluid in her lungs.  She has had a needle put her lungs a few years ago to draw off the fluid (? Thoracentesis).  Still smokes 10 or up to a pack a day, but only 7 today.  C/o mild shooting pain in her upper abdomen, nausea and sleepiness.

## 2014-02-03 NOTE — ED Notes (Signed)
Cardiology at bedside.

## 2014-02-03 NOTE — ED Notes (Signed)
[facility] unable to transfer from this building.  [facility] called.

## 2014-02-03 NOTE — ED Provider Notes (Signed)
CSN: [id number]     Arrival date & time 02/03/14  1921 History   None    Chief Complaint  Patient presents with  . Shortness of Breath     (Consider location/radiation/quality/duration/timing/severity/associated sxs/prior Treatment) HPI Comments: 58 year old female with a history of COPD as well as a history of peripheral and pulmonary edema in the past (no history of echocardiogram per the electronic medical record). She presents from the urgent care by EMS because of shortness of breath and hypoxia. The patient has had shortness of breath which is gradually worsening over the last 4 days concordant with her lack of Lasix at home. She has been using albuterol with minimal improvement and when she presented to the urgent care she received a DuoNeb and Solu-Medrol with some improvement. The symptoms are persistent, worsening and now severe. She denies fever chills but does have a productive cough. She has significant dyspnea on exertion  Patient is a 58 y.o. female presenting with shortness of breath. The history is provided by the patient and medical records.  Shortness of Breath   Past Medical History  Diagnosis Date  . GERD (gastroesophageal reflux disease)   . Schizophrenia   . Tobacco abuse   . Hyperlipemia   . Hypercholesteremia   . CHF (congestive heart failure)   . Diabetes mellitus without complication    Past Surgical History  Procedure Laterality Date  . None     History reviewed. No pertinent family history. History  Substance Use Topics  . Smoking status: Current Every Day Smoker -- 1.00 packs/day for 21 years    Types: Cigarettes  . Smokeless tobacco: Never Used     Comment: 10 up to a pack- 7 today   . Alcohol Use: No   OB History   Grav Para Term Preterm Abortions TAB SAB Ect Mult Living                 Review of Systems  Respiratory: Positive for shortness of breath.   All other systems reviewed and are negative.     Allergies  Penicillins and  Quetiapine  Home Medications   Prior to Admission medications   Medication Sig Start Date End Date Taking? Authorizing Provider  albuterol (PROVENTIL HFA;VENTOLIN HFA) 108 (90 BASE) MCG/ACT inhaler Inhale 2 puffs into the lungs every 6 (six) hours as needed for shortness of breath.    Yes Historical Provider, MD  cholecalciferol (VITAMIN D) 400 UNITS TABS Take 400 Units by mouth daily.   Yes Historical Provider, MD  furosemide (LASIX) 20 MG tablet Take 20 mg by mouth daily as needed for fluid.    Yes Historical Provider, MD  metFORMIN (GLUCOPHAGE-XR) 750 MG 24 hr tablet Take 750 mg by mouth daily with breakfast.   Yes Historical Provider, MD  risperiDONE (RISPERDAL) 1 MG tablet Take 2 mg by mouth at bedtime.    Yes Historical Provider, MD  rosuvastatin (CRESTOR) 5 MG tablet Take 5 mg by mouth daily.   Yes Historical Provider, MD   BP 122/78  Pulse 76  Resp 31  SpO2 86% Physical Exam  Nursing note and vitals reviewed. Constitutional: She appears well-developed and well-nourished. She appears distressed.  HENT:  Head: Normocephalic and atraumatic.  Mouth/Throat: Oropharynx is clear and moist. No oropharyngeal exudate.  Eyes: Conjunctivae and EOM are normal. Pupils are equal, round, and reactive to light. Right eye exhibits no discharge. Left eye exhibits no discharge. No scleral icterus.  Neck: Normal range of motion. Neck supple. No  JVD present. No thyromegaly present.  Cardiovascular: Normal rate, regular rhythm, normal heart sounds and intact distal pulses.  Exam reveals no gallop and no friction rub.   No murmur heard. Pulmonary/Chest: She is in respiratory distress. She has wheezes. She has rales.  Tachypnea, speaks in short and truncated sentences, diffuse rhonchi, diffuse expiratory wheezing with prolonged expiratory phase, no rales, no dullness to percussion  Abdominal: Soft. Bowel sounds are normal. She exhibits no distension and no mass. There is no tenderness.  Musculoskeletal:  Normal range of motion. She exhibits edema (bilateral symmetrical lower extremity below the knee pitting edema). She exhibits no tenderness.  Lymphadenopathy:    She has no cervical adenopathy.  Neurological: She is alert. Coordination normal.  Skin: Skin is warm and dry. No rash noted. No erythema.  Psychiatric: She has a normal mood and affect. Her behavior is normal.    ED Course  Procedures (including critical care time) Labs Review Labs Reviewed  PRO B NATRIURETIC PEPTIDE - Abnormal; Notable for the following:    Pro B Natriuretic peptide (BNP) 2600.0 (*)    All other components within normal limits  BASIC METABOLIC PANEL - Abnormal; Notable for the following:    Sodium 133 (*)    Chloride 93 (*)    All other components within normal limits  CBC - Abnormal; Notable for the following:    RBC 5.22 (*)    Hemoglobin 17.0 (*)    HCT 49.8 (*)    All other components within normal limits  TROPONIN I    Imaging Review Dg Chest 2 View  02/03/2014   CLINICAL DATA:  Shortness of breath.  EXAM: CHEST  2 VIEW  COMPARISON:  06/30/2011  FINDINGS: Stable moderate cardiomegaly. Mediastinal contours are normal. There is prominence of the central pulmonary vasculature and peripheral pulmonary venous congestion. Slight prominence of interstitial lung markings bilaterally. Very small bilateral pleural effusions the posterior costophrenic angles.  No consolidation or pneumothorax. Trachea midline. Bones osteopenic.  IMPRESSION: Mild congestive heart failure pattern.   Electronically Signed   By: Curlene Dolphin M.D.   On: 02/03/2014 20:49     EKG Interpretation   Date/Time:  Tuesday February 03 2014 19:36:16 EDT Ventricular Rate:  86 PR Interval:  139 QRS Duration: 76 QT Interval:  401 QTC Calculation: 480 R Axis:   -166 Text Interpretation:  Sinus rhythm Consider right ventricular hypertrophy  Abnormal T, consider ischemia, diffuse leads Abnormal ekg Since last  tracing diffuse T wave  abnormalities Confirmed by Brodi Nery  MD, Sanskriti Greenlaw  [phone]) on 02/03/2014 7:39:46 PM      MDM   Final diagnoses:  Respiratory distress  Congestive heart failure, unspecified congestive heart failure chronicity, unspecified congestive heart failure type    The patient has both evidence of fluid overload as well as COPD, albuterol treatment ordered, EKG, chest x-ray, labs. Potentially congestive heart failure versus COPD exacerbation with associated peripheral edema. She denies a history of CHF  The patient has increased respiratory effort, she is in mild to moderate distress and requiring continuous nebulizer therapy. Her chest x-ray shows congestive heart failure, EKG with diffuse abnormal findings and a BNP which is significantly elevated consistent with new-onset congestive heart failure.  Lasix Continuous nebulizer Aspirin nitroglycerin  The patient had an episode of hypotension, this seemed to be somewhat persistent despite removing nitroglycerin paste from the chest, her initial ABG showed in respiratory acidosis and the patient was placed on BiPAP due to her slight hypoxia and worsening hypercapnia.  Over the first hour of BiPAP use despite adjustments she continued to become more hypercapnic, more acidotic and her mental status decreased more. The decision was made to intubate her secondary to her hypercapnic respiratory failure and worsening decline in mental status. This was discussed with the patient, it was difficult to arouse her.    Discussed with the cardiologist who was in agreement, he will involve critical care.  INTUBATION Performed by: Johnna Acosta  Required items: required blood products, implants, devices, and special equipment available Patient identity confirmed: provided demographic data and hospital-assigned identification number Time out: Immediately prior to procedure a "time out" was called to verify the correct patient, procedure, equipment, support staff and  site/side marked as required.  Indications: Hypercapnic respiratory failure   Intubation method: Direct  Laryngoscopy   Preoxygenation: BVM  Sedatives: 20 mg Etomidate Paralytic: 100 mg Succinylcholine  Tube Size: 7.5 cuffed  Post-procedure assessment: chest rise and ETCO2 monitor Breath sounds: equal and absent over the epigastrium Tube secured with: ETT holder Chest x-ray interpreted by radiologist and me.  Chest x-ray findings: endotracheal tube in appropriate position  Patient tolerated the procedure well with no immediate complications.   Hypercapnic respiratory failure, altered  Discussed with cardiology will admit the patient to hospital.  CRITICAL CARE Performed by: Johnna Acosta Total critical care time: 35 Critical care time was exclusive of separately billable procedures and treating other patients. Critical care was necessary to treat or prevent imminent or life-threatening deterioration. Critical care was time spent personally by me on the following activities: development of treatment plan with patient and/or surrogate as well as nursing, discussions with consultants, evaluation of patient's response to treatment, examination of patient, obtaining history from patient or surrogate, ordering and performing treatments and interventions, ordering and review of laboratory studies, ordering and review of radiographic studies, pulse oximetry and re-evaluation of patient's condition.  Meds given in ED:  Medications  albuterol (PROVENTIL,VENTOLIN) solution continuous neb (10 mg/hr Nebulization New Bag/Given 02/03/14 2043)  lidocaine (cardiac) 100 mg/68ml (XYLOCAINE) 20 MG/ML injection 2% (not administered)  rocuronium (ZEMURON) 50 MG/5ML injection (not administered)  ipratropium (ATROVENT) nebulizer solution 0.5 mg (0.5 mg Nebulization Given 02/03/14 2043)  furosemide (LASIX) injection 40 mg (40 mg Intravenous Given 02/03/14 2047)  aspirin chewable tablet 324 mg (324 mg  Oral Given 02/03/14 2216)  nitroGLYCERIN (NITROGLYN) 2 % ointment 0.5 inch (0.5 inches Topical Given 02/03/14 2218)  albuterol (PROVENTIL) (2.5 MG/3ML) 0.083% nebulizer solution 5 mg (5 mg Nebulization Given 02/03/14 2348)  succinylcholine (ANECTINE) 20 MG/ML injection (100 mg  Given 02/04/14 0023)  etomidate (AMIDATE) 2 MG/ML injection (10 mg  Given 02/04/14 0023)  etomidate (AMIDATE) injection 10 mg (10 mg Intravenous Given 02/04/14 0028)  propofol (DIPRIVAN) 10 mg/ml infusion (30 mcg/kg/min Intravenous New Bag/Given 02/04/14 0032)         Johnna Acosta, MD 02/04/14 1027

## 2014-02-03 NOTE — H&P (Signed)
History and Physical  Patient ID: Kim [name] MRN: [id number], SOB: Nov 23, 1955 58 y.o. Date of Encounter: 02/03/2014, 11:43 PM  Primary Physician: Charlott Rakes, NP Primary Cardiologist: unassigned  Chief Complaint: shortness of breath  HPI: 58 y.o. female w/ PMHx significant for DM2, tobacco abuse, presumed COPD, s/p remote thoracentesis for parapneumonic effusion, ?CHF (on lasix for LE edema) and schizophrenia who presented to [facility] on 02/03/2014 with complaints of shortness of breath and fatigue x 4 days. Initially presented to Urgent Care and with productive cough and subjective fevers this afternoon. Was transferred to [facility] ER due to requiring 5 L 02. Given duoneb and 125 of solumedrol.  History is gathered from other notes and limited history provided by patient as she is on Bipap. She reports for the past 4 days she has been very sleepy and tired. Previously was on lasix (for LE edema I believe) and she reports that she has not taken this for the last 4 days. Unsure about weight gain. Endorses orthopnea.   Reports stable psych symptoms. No hallucinations. Still smoking.  EKG revealed sinus tach at 100 with diffuse ST inversions new from several years ago (in [place] but not Epic). CXR c/w fluid overload . Labs are significant for BNP of 2000.  Given 40 IV of lasix and put of 500 ml. Nitro paste added but removed when BPs dropped in the 90s.   While in the ER, pt became more somnelent and increased work of breathing. Blood gas of 7.24/72/79. BiPap started without significant improvement in mental status.    Past Medical History  Diagnosis Date  . GERD (gastroesophageal reflux disease)   . Schizophrenia   . Tobacco abuse   . Hyperlipemia   . Hypercholesteremia   . CHF (congestive heart failure)   . Diabetes mellitus without complication      Surgical History:  Past Surgical History  Procedure Laterality Date  . None       Home Meds: Prior to Admission  medications   Medication Sig Start Date End Date Taking? Authorizing Provider  albuterol (PROVENTIL HFA;VENTOLIN HFA) 108 (90 BASE) MCG/ACT inhaler Inhale 2 puffs into the lungs every 6 (six) hours as needed for shortness of breath.    Yes Historical Provider, MD  cholecalciferol (VITAMIN D) 400 UNITS TABS Take 400 Units by mouth daily.   Yes Historical Provider, MD  furosemide (LASIX) 20 MG tablet Take 20 mg by mouth daily as needed for fluid.    Yes Historical Provider, MD  metFORMIN (GLUCOPHAGE-XR) 750 MG 24 hr tablet Take 750 mg by mouth daily with breakfast.   Yes Historical Provider, MD  risperiDONE (RISPERDAL) 1 MG tablet Take 2 mg by mouth at bedtime.    Yes Historical Provider, MD  rosuvastatin (CRESTOR) 5 MG tablet Take 5 mg by mouth daily.   Yes Historical Provider, MD    Allergies:  Allergies  Allergen Reactions  . Penicillins Nausea And Vomiting    Large doses  . Quetiapine     REACTION: nausea/dizzy    History   Social History  . Marital Status: Single    Spouse Name: N/A    Number of Children: N/A  . Years of Education: N/A   Occupational History  . Not on file.   Social History Main Topics  . Smoking status: Current Every Day Smoker -- 1.00 packs/day for 21 years    Types: Cigarettes  . Smokeless tobacco: Never Used     Comment: 10 up to a pack-  7 today   . Alcohol Use: No  . Drug Use: No  . Sexual Activity: Yes    Birth Control/ Protection: None   Other Topics Concern  . Not on file   Social History Narrative   Resident at [facility],   [address] [place], [place] [address]     History reviewed. No pertinent family history.  Review of Systems: General: +subj feverse Cardiovascular: see HPI Dermatological: negative for rash Respiratory: SOB, +wheezing, +coughing Urologic: negative for hematuria Abdominal: negative for nausea, vomiting, diarrhea, bright red blood per rectum, melena, or hematemesis Neurologic: negative for visual changes,  syncope, or dizziness All other systems reviewed and are otherwise negative except as noted above.  Labs:   Lab Results  Component Value Date   WBC 10.5 02/03/2014   HGB 17.0* 02/03/2014   HCT 49.8* 02/03/2014   MCV 95.4 02/03/2014   PLT 198 02/03/2014    Recent Labs Lab 02/03/14 1937  NA 133*  K 4.5  CL 93*  CO2 29  BUN 8  CREATININE 0.56  CALCIUM 9.4  GLUCOSE 99    Recent Labs  02/03/14 1937  TROPONINI <0.30    Radiology/Studies:  Dg Chest 2 View  02/03/2014   CLINICAL DATA:  Shortness of breath.  EXAM: CHEST  2 VIEW  COMPARISON:  06/30/2011  FINDINGS: Stable moderate cardiomegaly. Mediastinal contours are normal. There is prominence of the central pulmonary vasculature and peripheral pulmonary venous congestion. Slight prominence of interstitial lung markings bilaterally. Very small bilateral pleural effusions the posterior costophrenic angles.  No consolidation or pneumothorax. Trachea midline. Bones osteopenic.  IMPRESSION: Mild congestive heart failure pattern.   Electronically Signed   By: Curlene Dolphin M.D.   On: 02/03/2014 20:49     EKG: see HPI  Physical Exam: Blood pressure 107/58, pulse 93, resp. rate 20, SpO2 99.00%. General: somnlent but responds to questions Head: Normocephalic, atraumatic, bipap in placed Neck: Supple. JVD not able to be visualized due to bipap Lungs: diffuse wheezing with coarse crackles bil, R worse than L Heart: RRR with S1 S2. No murmurs, rubs, or gallops appreciated. Abdomen: Soft, non-tender, non-distended with normoactive bowel sounds. No rebound/guarding. No obvious abdominal masses. Msk:  Strength and tone appear normal for age. Extremities: +1 nonpitting edema. No clubbing or cyanosis. Distal pedal pulses are 2+ and equal bilaterally. Neuro: Somnlent but oriented X 3. Moves all extremities spontaneously.   Problem List 1. Acute decompensated heart failure, EF unknown, with hypoxia and hypercarbia 2. Somnolence, likely due to  hypercarbia 3. Presumed COPD, h/o thoracentesis for parapneumonic effusion 4. Diabetes 5. EKG changes 6. Hyperlipidemia 7. Tobacco Abuse 8. Schizophrenia, appears stable on current med  ASSESSMENT AND PLAN:  58 y.o. female w/ PMHx significant for DM2, tobacco abuse, presumed COPD, s/p remote thoracentesis for parapneumonic effusion, ?CHF (on lasix for LE edema) and schizophrenia who presented to [facility] on 02/03/2014 with complaints of shortness of breath and fatigue x 4 days.  Unfortunately, she is not making a quick turnaround on the BiPap as expected. Acute hypoxia and hypercarbia likely due to several causes including left heart failure, COPD, and underlying chronic lung disease (h/o of thoracentesis and scarring). Worsening hypercarbia despite bipap warrants intubation.   If able further diuresis is needed. Hold beta blockers due to wheezing and acute decompensation. No room for ACEI currently due to BP. Continue duonebs and steroids for COPD component. Consider antibiotics though ET tube secretions are most consistent with fluid overload, no fever,  and nl WBC.  The patient e reports being on lasix for LE edema though does not carry a formal diagnosis of CHF and has not been evaluated. Needs echo to determine systolic vs. Diastolic. Ischemia evaluation also warranted at some point given her risk factors of diabetes and tobacco abuse.  Her diffuse EKG changes are concerning though somewhat nonspecific (ischemic, LVH, takotsubo). Serial enzymes, and echo will help further evaluate for etiology. Continue aspirin, statin. Heparinize if troponins significantly bump.  Hold metformin while in patient. Cover with sliding scale.  Hold respirdone while intubated.  Appreciate assistance from Critical care regarding vent management and sedation.  Signed, Elias Else, Mila Homer MD 02/03/2014, 11:43 PM

## 2014-02-04 ENCOUNTER — Inpatient Hospital Stay (HOSPITAL_COMMUNITY): Payer: Medicare Other

## 2014-02-04 ENCOUNTER — Emergency Department (HOSPITAL_COMMUNITY): Payer: Medicare Other

## 2014-02-04 DIAGNOSIS — I2510 Atherosclerotic heart disease of native coronary artery without angina pectoris: Secondary | ICD-10-CM | POA: Diagnosis present

## 2014-02-04 DIAGNOSIS — J90 Pleural effusion, not elsewhere classified: Secondary | ICD-10-CM | POA: Diagnosis not present

## 2014-02-04 DIAGNOSIS — J9690 Respiratory failure, unspecified, unspecified whether with hypoxia or hypercapnia: Secondary | ICD-10-CM | POA: Diagnosis present

## 2014-02-04 DIAGNOSIS — Z79899 Other long term (current) drug therapy: Secondary | ICD-10-CM | POA: Diagnosis not present

## 2014-02-04 DIAGNOSIS — J9600 Acute respiratory failure, unspecified whether with hypoxia or hypercapnia: Secondary | ICD-10-CM | POA: Diagnosis present

## 2014-02-04 DIAGNOSIS — E872 Acidosis, unspecified: Secondary | ICD-10-CM | POA: Diagnosis present

## 2014-02-04 DIAGNOSIS — I517 Cardiomegaly: Secondary | ICD-10-CM | POA: Diagnosis not present

## 2014-02-04 DIAGNOSIS — J441 Chronic obstructive pulmonary disease with (acute) exacerbation: Secondary | ICD-10-CM | POA: Diagnosis not present

## 2014-02-04 DIAGNOSIS — R079 Chest pain, unspecified: Secondary | ICD-10-CM | POA: Diagnosis not present

## 2014-02-04 DIAGNOSIS — Z23 Encounter for immunization: Secondary | ICD-10-CM | POA: Diagnosis not present

## 2014-02-04 DIAGNOSIS — G9340 Encephalopathy, unspecified: Secondary | ICD-10-CM | POA: Diagnosis not present

## 2014-02-04 DIAGNOSIS — R4020 Unspecified coma: Secondary | ICD-10-CM

## 2014-02-04 DIAGNOSIS — I5033 Acute on chronic diastolic (congestive) heart failure: Secondary | ICD-10-CM | POA: Diagnosis not present

## 2014-02-04 DIAGNOSIS — E785 Hyperlipidemia, unspecified: Secondary | ICD-10-CM | POA: Diagnosis present

## 2014-02-04 DIAGNOSIS — R0609 Other forms of dyspnea: Secondary | ICD-10-CM | POA: Diagnosis present

## 2014-02-04 DIAGNOSIS — R9431 Abnormal electrocardiogram [ECG] [EKG]: Secondary | ICD-10-CM

## 2014-02-04 DIAGNOSIS — K219 Gastro-esophageal reflux disease without esophagitis: Secondary | ICD-10-CM | POA: Diagnosis present

## 2014-02-04 DIAGNOSIS — I498 Other specified cardiac arrhythmias: Secondary | ICD-10-CM | POA: Diagnosis present

## 2014-02-04 DIAGNOSIS — Z4682 Encounter for fitting and adjustment of non-vascular catheter: Secondary | ICD-10-CM | POA: Diagnosis not present

## 2014-02-04 DIAGNOSIS — R0989 Other specified symptoms and signs involving the circulatory and respiratory systems: Secondary | ICD-10-CM | POA: Diagnosis not present

## 2014-02-04 DIAGNOSIS — Z452 Encounter for adjustment and management of vascular access device: Secondary | ICD-10-CM | POA: Diagnosis not present

## 2014-02-04 DIAGNOSIS — I959 Hypotension, unspecified: Secondary | ICD-10-CM | POA: Diagnosis present

## 2014-02-04 DIAGNOSIS — I509 Heart failure, unspecified: Secondary | ICD-10-CM | POA: Diagnosis not present

## 2014-02-04 DIAGNOSIS — J9819 Other pulmonary collapse: Secondary | ICD-10-CM | POA: Diagnosis not present

## 2014-02-04 DIAGNOSIS — J9620 Acute and chronic respiratory failure, unspecified whether with hypoxia or hypercapnia: Secondary | ICD-10-CM | POA: Diagnosis not present

## 2014-02-04 DIAGNOSIS — I5031 Acute diastolic (congestive) heart failure: Secondary | ICD-10-CM | POA: Diagnosis not present

## 2014-02-04 DIAGNOSIS — E119 Type 2 diabetes mellitus without complications: Secondary | ICD-10-CM | POA: Diagnosis present

## 2014-02-04 DIAGNOSIS — Z880 Allergy status to penicillin: Secondary | ICD-10-CM | POA: Diagnosis not present

## 2014-02-04 DIAGNOSIS — F209 Schizophrenia, unspecified: Secondary | ICD-10-CM | POA: Diagnosis present

## 2014-02-04 DIAGNOSIS — R4182 Altered mental status, unspecified: Secondary | ICD-10-CM | POA: Diagnosis present

## 2014-02-04 DIAGNOSIS — I209 Angina pectoris, unspecified: Secondary | ICD-10-CM | POA: Diagnosis not present

## 2014-02-04 DIAGNOSIS — F17200 Nicotine dependence, unspecified, uncomplicated: Secondary | ICD-10-CM | POA: Diagnosis present

## 2014-02-04 LAB — BLOOD GAS, ARTERIAL
Acid-Base Excess: 5.8 mmol/L — ABNORMAL HIGH (ref 0.0–2.0)
BICARBONATE: 32.4 meq/L — AB (ref 20.0–24.0)
Drawn by: 41934
FIO2: 0.6 %
LHR: 22 {breaths}/min
MECHVT: 350 mL
O2 Saturation: 96.6 %
PATIENT TEMPERATURE: 98.6
PEEP/CPAP: 5 cmH2O
PH ART: 7.265 — AB (ref 7.350–7.450)
PO2 ART: 96.9 mmHg (ref 80.0–100.0)
TCO2: 34.7 mmol/L (ref 0–100)
pCO2 arterial: 73.9 mmHg (ref 35.0–45.0)

## 2014-02-04 LAB — CBC WITH DIFFERENTIAL/PLATELET
Basophils Absolute: 0 10*3/uL (ref 0.0–0.1)
Basophils Relative: 0 % (ref 0–1)
EOS PCT: 0 % (ref 0–5)
Eosinophils Absolute: 0 10*3/uL (ref 0.0–0.7)
HCT: 49.2 % — ABNORMAL HIGH (ref 36.0–46.0)
HEMOGLOBIN: 16.2 g/dL — AB (ref 12.0–15.0)
LYMPHS PCT: 4 % — AB (ref 12–46)
Lymphs Abs: 0.3 10*3/uL — ABNORMAL LOW (ref 0.7–4.0)
MCH: 32.2 pg (ref 26.0–34.0)
MCHC: 32.9 g/dL (ref 30.0–36.0)
MCV: 97.8 fL (ref 78.0–100.0)
MONOS PCT: 1 % — AB (ref 3–12)
Monocytes Absolute: 0.1 10*3/uL (ref 0.1–1.0)
NEUTROS ABS: 6.6 10*3/uL (ref 1.7–7.7)
Neutrophils Relative %: 95 % — ABNORMAL HIGH (ref 43–77)
Platelets: 187 10*3/uL (ref 150–400)
RBC: 5.03 MIL/uL (ref 3.87–5.11)
RDW: 13.4 % (ref 11.5–15.5)
WBC: 7 10*3/uL (ref 4.0–10.5)

## 2014-02-04 LAB — MRSA PCR SCREENING: MRSA by PCR: NEGATIVE

## 2014-02-04 LAB — I-STAT ARTERIAL BLOOD GAS, ED
Acid-Base Excess: 3 mmol/L — ABNORMAL HIGH (ref 0.0–2.0)
Acid-Base Excess: 3 mmol/L — ABNORMAL HIGH (ref 0.0–2.0)
BICARBONATE: 34.7 meq/L — AB (ref 20.0–24.0)
Bicarbonate: 33.1 mEq/L — ABNORMAL HIGH (ref 20.0–24.0)
O2 Saturation: 100 %
O2 Saturation: 99 %
PCO2 ART: 83.7 mmHg — AB (ref 35.0–45.0)
Patient temperature: 98.6
TCO2: 35 mmol/L (ref 0–100)
TCO2: 37 mmol/L (ref 0–100)
pCO2 arterial: 68.9 mmHg (ref 35.0–45.0)
pH, Arterial: 7.225 — ABNORMAL LOW (ref 7.350–7.450)
pH, Arterial: 7.29 — ABNORMAL LOW (ref 7.350–7.450)
pO2, Arterial: 155 mmHg — ABNORMAL HIGH (ref 80.0–100.0)
pO2, Arterial: 230 mmHg — ABNORMAL HIGH (ref 80.0–100.0)

## 2014-02-04 LAB — GLUCOSE, CAPILLARY
GLUCOSE-CAPILLARY: 202 mg/dL — AB (ref 70–99)
GLUCOSE-CAPILLARY: 159 mg/dL — AB (ref 70–99)
GLUCOSE-CAPILLARY: 179 mg/dL — AB (ref 70–99)
Glucose-Capillary: 159 mg/dL — ABNORMAL HIGH (ref 70–99)
Glucose-Capillary: 124 mg/dL — ABNORMAL HIGH (ref 70–99)

## 2014-02-04 LAB — POCT I-STAT 3, ART BLOOD GAS (G3+)
Acid-Base Excess: 9 mmol/L — ABNORMAL HIGH (ref 0.0–2.0)
Bicarbonate: 37.6 mEq/L — ABNORMAL HIGH (ref 20.0–24.0)
O2 Saturation: 96 %
PH ART: 7.387 (ref 7.350–7.450)
PO2 ART: 87 mmHg (ref 80.0–100.0)
Patient temperature: 37.4
TCO2: 39 mmol/L (ref 0–100)
pCO2 arterial: 62.7 mmHg (ref 35.0–45.0)

## 2014-02-04 LAB — TROPONIN I
Troponin I: <0.3 ng/mL (ref <0.30)
Troponin I: <0.3 ng/mL (ref <0.30)

## 2014-02-04 LAB — BASIC METABOLIC PANEL
Anion gap: 13 (ref 5–15)
BUN: 12 mg/dL (ref 6–23)
CO2: 29 mEq/L (ref 19–32)
CREATININE: 0.84 mg/dL (ref 0.50–1.10)
Calcium: 8.8 mg/dL (ref 8.4–10.5)
Chloride: 92 mEq/L — ABNORMAL LOW (ref 96–112)
GFR, EST AFRICAN AMERICAN: 87 mL/min — AB (ref >90)
GFR, EST NON AFRICAN AMERICAN: 75 mL/min — AB (ref >90)
GLUCOSE: 215 mg/dL — AB (ref 70–99)
POTASSIUM: 4 meq/L (ref 3.7–5.3)
Sodium: 134 mEq/L — ABNORMAL LOW (ref 137–147)

## 2014-02-04 LAB — TSH: TSH: 0.673 u[IU]/mL (ref 0.350–4.500)

## 2014-02-04 MED ORDER — LIDOCAINE HCL (CARDIAC) 20 MG/ML IV SOLN
INTRAVENOUS | Status: AC
  Filled 2014-02-04: qty 5

## 2014-02-04 MED ORDER — INSULIN ASPART 100 UNIT/ML ~~LOC~~ SOLN: 2.0000 [IU] | SUBCUTANEOUS | Status: DC

## 2014-02-04 MED ORDER — CHLORHEXIDINE GLUCONATE 0.12 % MT SOLN
15.0000 mL | Freq: Two times a day (BID) | OROMUCOSAL | Status: DC
  Administered 2014-02-04 – 2014-02-06 (×5): 15 mL via OROMUCOSAL
  Filled 2014-02-04 (×5): qty 15

## 2014-02-04 MED ORDER — SODIUM CHLORIDE 0.9 % IJ SOLN: 3.0000 mL | INTRAMUSCULAR | Status: DC | PRN

## 2014-02-04 MED ORDER — ENOXAPARIN SODIUM 40 MG/0.4ML ~~LOC~~ SOLN
40.0000 mg | SUBCUTANEOUS | Status: DC
  Administered 2014-02-04 – 2014-02-08 (×5): 40 mg via SUBCUTANEOUS
  Filled 2014-02-04 (×7): qty 0.4

## 2014-02-04 MED ORDER — NOREPINEPHRINE BITARTRATE 1 MG/ML IV SOLN: 2.0000 ug/min | INTRAVENOUS | Status: DC

## 2014-02-04 MED ORDER — PNEUMOCOCCAL VAC POLYVALENT 25 MCG/0.5ML IJ INJ
0.5000 mL | INJECTION | INTRAMUSCULAR | Status: AC
  Administered 2014-02-05: 0.5 mL via INTRAMUSCULAR
  Filled 2014-02-04: qty 0.5

## 2014-02-04 MED ORDER — SODIUM CHLORIDE 0.9 % IV SOLN: 250.0000 mL | INTRAVENOUS | Status: DC | PRN

## 2014-02-04 MED ORDER — FENTANYL CITRATE 0.05 MG/ML IJ SOLN
100.0000 ug | INTRAMUSCULAR | Status: DC | PRN
  Administered 2014-02-04 (×5): 100 ug via INTRAVENOUS
  Filled 2014-02-04 (×6): qty 2

## 2014-02-04 MED ORDER — CETYLPYRIDINIUM CHLORIDE 0.05 % MT LIQD
7.0000 mL | Freq: Four times a day (QID) | OROMUCOSAL | Status: DC
  Administered 2014-02-04 – 2014-02-06 (×9): 7 mL via OROMUCOSAL

## 2014-02-04 MED ORDER — PANTOPRAZOLE SODIUM 40 MG IV SOLR
40.0000 mg | Freq: Every day | INTRAVENOUS | Status: DC
  Administered 2014-02-04 – 2014-02-06 (×4): 40 mg via INTRAVENOUS
  Filled 2014-02-04 (×5): qty 40

## 2014-02-04 MED ORDER — ASPIRIN EC 81 MG PO TBEC
81.0000 mg | DELAYED_RELEASE_TABLET | Freq: Every day | ORAL | Status: DC
  Administered 2014-02-04 – 2014-02-10 (×7): 81 mg via ORAL
  Filled 2014-02-04 (×7): qty 1

## 2014-02-04 MED ORDER — INSULIN ASPART 100 UNIT/ML ~~LOC~~ SOLN
2.0000 [IU] | SUBCUTANEOUS | Status: DC
  Administered 2014-02-04: 4 [IU] via SUBCUTANEOUS
  Administered 2014-02-04: 6 [IU] via SUBCUTANEOUS
  Administered 2014-02-04: 4 [IU] via SUBCUTANEOUS
  Administered 2014-02-04: 2 [IU] via SUBCUTANEOUS
  Administered 2014-02-04: 4 [IU] via SUBCUTANEOUS
  Administered 2014-02-05 (×3): 2 [IU] via SUBCUTANEOUS
  Administered 2014-02-05 (×2): 4 [IU] via SUBCUTANEOUS
  Administered 2014-02-06: 2 [IU] via SUBCUTANEOUS
  Administered 2014-02-06: 6 [IU] via SUBCUTANEOUS
  Administered 2014-02-06: 2 [IU] via SUBCUTANEOUS
  Administered 2014-02-06: 4 [IU] via SUBCUTANEOUS
  Administered 2014-02-07: 2 [IU] via SUBCUTANEOUS
  Administered 2014-02-08: 4 [IU] via SUBCUTANEOUS
  Administered 2014-02-08: 6 [IU] via SUBCUTANEOUS

## 2014-02-04 MED ORDER — ATORVASTATIN CALCIUM 10 MG PO TABS
10.0000 mg | ORAL_TABLET | Freq: Every day | ORAL | Status: DC
  Administered 2014-02-04 – 2014-02-09 (×6): 10 mg via ORAL
  Filled 2014-02-04 (×7): qty 1

## 2014-02-04 MED ORDER — SODIUM CHLORIDE 0.9 % IJ SOLN
3.0000 mL | Freq: Two times a day (BID) | INTRAMUSCULAR | Status: DC
  Administered 2014-02-04: 3 mL via INTRAVENOUS

## 2014-02-04 MED ORDER — PROPOFOL 10 MG/ML IV EMUL
5.0000 ug/kg/min | Freq: Once | INTRAVENOUS | Status: DC
  Administered 2014-02-04: 30 ug/kg/min via INTRAVENOUS
  Administered 2014-02-04: 50 ug/kg/min via INTRAVENOUS

## 2014-02-04 MED ORDER — IPRATROPIUM-ALBUTEROL 0.5-2.5 (3) MG/3ML IN SOLN
3.0000 mL | Freq: Four times a day (QID) | RESPIRATORY_TRACT | Status: DC
  Administered 2014-02-04 – 2014-02-07 (×16): 3 mL via RESPIRATORY_TRACT
  Filled 2014-02-04 (×16): qty 3

## 2014-02-04 MED ORDER — ALBUTEROL SULFATE (2.5 MG/3ML) 0.083% IN NEBU: 2.5000 mg | INHALATION_SOLUTION | RESPIRATORY_TRACT | Status: DC | PRN

## 2014-02-04 MED ORDER — HYDRALAZINE HCL 20 MG/ML IJ SOLN
10.0000 mg | INTRAMUSCULAR | Status: DC | PRN
  Administered 2014-02-04: 10 mg via INTRAVENOUS
  Filled 2014-02-04: qty 1

## 2014-02-04 MED ORDER — DEXMEDETOMIDINE HCL IN NACL 200 MCG/50ML IV SOLN
0.0000 ug/kg/h | INTRAVENOUS | Status: DC
  Administered 2014-02-04: 0.4 ug/kg/h via INTRAVENOUS
  Administered 2014-02-04: 0.6 ug/kg/h via INTRAVENOUS
  Filled 2014-02-04 (×3): qty 50

## 2014-02-04 MED ORDER — VITAL HIGH PROTEIN PO LIQD
1000.0000 mL | ORAL | Status: DC
  Administered 2014-02-04: 1000 mL
  Administered 2014-02-04: 20:00:00
  Administered 2014-02-05: 1000 mL
  Administered 2014-02-05: 20:00:00
  Filled 2014-02-04 (×4): qty 1000

## 2014-02-04 MED ORDER — ETOMIDATE 2 MG/ML IV SOLN
10.0000 mg | Freq: Once | INTRAVENOUS | Status: AC
  Administered 2014-02-04: 10 mg via INTRAVENOUS

## 2014-02-04 MED ORDER — DEXMEDETOMIDINE HCL IN NACL 400 MCG/100ML IV SOLN
0.0000 ug/kg/h | INTRAVENOUS | Status: DC
  Administered 2014-02-04: 0.8 ug/kg/h via INTRAVENOUS
  Administered 2014-02-04: 0.6 ug/kg/h via INTRAVENOUS
  Administered 2014-02-04: 0.8 ug/kg/h via INTRAVENOUS
  Administered 2014-02-05 – 2014-02-06 (×4): 0.6 ug/kg/h via INTRAVENOUS
  Filled 2014-02-04 (×7): qty 100

## 2014-02-04 MED ORDER — PROPOFOL 10 MG/ML IV EMUL
INTRAVENOUS | Status: AC
  Administered 2014-02-04: 30 ug/kg/min via INTRAVENOUS
  Filled 2014-02-04: qty 100

## 2014-02-04 MED ORDER — PRO-STAT SUGAR FREE PO LIQD
60.0000 mL | Freq: Two times a day (BID) | ORAL | Status: DC
  Administered 2014-02-04 – 2014-02-06 (×5): 60 mL
  Filled 2014-02-04 (×6): qty 60

## 2014-02-04 MED ORDER — HEPARIN SODIUM (PORCINE) 5000 UNIT/ML IJ SOLN: 5000.0000 [IU] | Freq: Three times a day (TID) | INTRAMUSCULAR | Status: DC

## 2014-02-04 MED ORDER — SODIUM CHLORIDE 0.9 % IJ SOLN
3.0000 mL | Freq: Two times a day (BID) | INTRAMUSCULAR | Status: DC
  Administered 2014-02-04 – 2014-02-10 (×14): 3 mL via INTRAVENOUS

## 2014-02-04 MED ORDER — NOREPINEPHRINE BITARTRATE 1 MG/ML IV SOLN
2.0000 ug/min | INTRAVENOUS | Status: DC
  Filled 2014-02-04: qty 16

## 2014-02-04 MED ORDER — INFLUENZA VAC SPLIT QUAD 0.5 ML IM SUSY
0.5000 mL | PREFILLED_SYRINGE | INTRAMUSCULAR | Status: AC
  Administered 2014-02-05: 0.5 mL via INTRAMUSCULAR
  Filled 2014-02-04: qty 0.5

## 2014-02-04 MED ORDER — FUROSEMIDE 10 MG/ML IJ SOLN
40.0000 mg | Freq: Two times a day (BID) | INTRAMUSCULAR | Status: DC
  Administered 2014-02-04 – 2014-02-07 (×7): 40 mg via INTRAVENOUS
  Filled 2014-02-04 (×9): qty 4

## 2014-02-04 MED ORDER — ETOMIDATE 2 MG/ML IV SOLN
INTRAVENOUS | Status: AC
  Administered 2014-02-04: 10 mg
  Filled 2014-02-04: qty 20

## 2014-02-04 MED ORDER — METHYLPREDNISOLONE SODIUM SUCC 125 MG IJ SOLR
60.0000 mg | Freq: Two times a day (BID) | INTRAMUSCULAR | Status: DC
  Administered 2014-02-04 – 2014-02-06 (×5): 60 mg via INTRAVENOUS
  Filled 2014-02-04: qty 0.96
  Filled 2014-02-04: qty 2
  Filled 2014-02-04 (×5): qty 0.96

## 2014-02-04 MED ORDER — ADULT MULTIVITAMIN LIQUID CH
5.0000 mL | Freq: Every day | ORAL | Status: DC
  Administered 2014-02-04 – 2014-02-06 (×3): 5 mL
  Filled 2014-02-04 (×4): qty 5

## 2014-02-04 MED ORDER — ROCURONIUM BROMIDE 50 MG/5ML IV SOLN
INTRAVENOUS | Status: AC
  Filled 2014-02-04: qty 2

## 2014-02-04 MED ORDER — INSULIN ASPART 100 UNIT/ML ~~LOC~~ SOLN: 0.0000 [IU] | Freq: Three times a day (TID) | SUBCUTANEOUS | Status: DC

## 2014-02-04 MED ORDER — SUCCINYLCHOLINE CHLORIDE 20 MG/ML IJ SOLN
INTRAMUSCULAR | Status: AC
  Administered 2014-02-04: 100 mg
  Filled 2014-02-04: qty 1

## 2014-02-04 MED ORDER — FENTANYL CITRATE 0.05 MG/ML IJ SOLN
100.0000 ug | Freq: Once | INTRAMUSCULAR | Status: AC
  Administered 2014-02-04: 100 ug via INTRAVENOUS

## 2014-02-04 MED ORDER — MIDAZOLAM HCL 2 MG/2ML IJ SOLN
INTRAMUSCULAR | Status: AC
  Administered 2014-02-04: 2 mg
  Filled 2014-02-04: qty 4

## 2014-02-04 NOTE — ED Notes (Signed)
Pt moved to trauma C. All of patient belongings sent with patient. Pt alert and discussing options with MD Sabra Heck.

## 2014-02-04 NOTE — Procedures (Signed)
Central Venous Catheter Insertion Procedure Note Kim [name] [id number] Nov 10, 1955  Procedure: Insertion of Central Venous Catheter Indications: Assessment of intravascular volume, Drug and/or fluid administration and Frequent blood sampling  Procedure Details Consent: Unable to obtain consent because of emergent medical necessity. Time Out: Verified patient identification, verified procedure, site/side was marked, verified correct patient position, special equipment/implants available, medications/allergies/relevent history reviewed, required imaging and test results available.  Performed  Maximum sterile technique was used including antiseptics, cap, gloves, gown, hand hygiene, mask and sheet. Skin prep: Chlorhexidine; local anesthetic administered A antimicrobial bonded/coated triple lumen catheter was placed in the right internal jugular vein using the Seldinger technique. Ultrasound guidance used.Yes.   Catheter placed to 16 cm. Blood aspirated via all 3 ports and then flushed x 3. Line sutured x 2 and dressing applied.  Evaluation Blood flow good Complications: No apparent complications Patient did tolerate procedure well. Chest X-ray ordered to verify placement.  CXR: pending.  Georgann Housekeeper, ACNP [place] [facility] Pager [phone] or [phone]  I was present and supervised the entire procedure.  U/S used in placement.  Rush Farmer, M.D. [facility] Pulmonary/Critical Care Medicine. Pager: [phone]. After hours pager: [phone].

## 2014-02-04 NOTE — ED Notes (Signed)
Called [facility] and Cardiology for Dr. Sabra Heck

## 2014-02-04 NOTE — Progress Notes (Signed)
INITIAL NUTRITION ASSESSMENT  DOCUMENTATION CODES Per approved criteria  -Morbid Obesity   INTERVENTION: Initiate Vital High Protein @ 30 ml/hr via OG tube.   60 ml Prostat BID.    MVI daily  Tube feeding regimen provides 1120 kcal (66% of needs), 123 grams of protein, and 601 ml of H2O.   NUTRITION DIAGNOSIS: Inadequate oral intake related to inability to eat as evidenced by NPO status  Goal: Enteral nutrition to provide 60-70% of estimated calorie needs (22-25 kcals/kg ideal body weight) and 100% of estimated protein needs, based on ASPEN guidelines for permissive underfeeding in critically ill obese individuals  Monitor:  TF initiation and tolerance, weight trends, labs  Reason for Assessment: Consult received to initiate and manage enteral nutrition support.  58 y.o. female  Admitting Dx: Dyspnea  ASSESSMENT: Pt with hx of COPD, CHF and schizophrenia.  Pt has not taken her lasix for 4 days, admitted from urgent care with SOB.   Patient is currently intubated on ventilator support MV: 9.7 L/min Temp (24hrs), Avg:98.9 F (37.2 C), Min:97.9 F (36.6 C), Max:99.7 F (37.6 C)  CBG's: 159-202  No fat or muscle depletion noted.   Height: Ht Readings from Last 1 Encounters:  02/04/14 5' (1.524 m)    Weight: Wt Readings from Last 1 Encounters:  02/04/14 208 lb 5.4 oz (94.5 kg)    Ideal Body Weight: 45.4 kg   % Ideal Body Weight: 208%  Wt Readings from Last 10 Encounters:  02/04/14 208 lb 5.4 oz (94.5 kg)  11/21/13 210 lb 6.4 oz (95.437 kg)  10/21/13 208 lb 12.8 oz (94.711 kg)  02/02/13 185 lb (83.915 kg)  06/30/11 183 lb 9.6 oz (83.28 kg)  10/28/08 187 lb (84.823 kg)  08/28/08 193 lb 4 oz (87.658 kg)    Usual Body Weight: 208 lb x 3 months  % Usual Body Weight: 100%  BMI:  Body mass index is 40.69 kg/(m^2).  Estimated Nutritional Needs: Kcal: 1693 Protein: >/= 113 grams Fluid: > 1.5 L/day  Skin: rash on buttocks   Diet Order:  NPO  EDUCATION NEEDS: -No education needs identified at this time   Intake/Output Summary (Last 24 hours) at 02/04/14 1117 Last data filed at 02/04/14 1100  Gross per 24 hour  Intake 139.46 ml  Output   2645 ml  Net -2505.54 ml    Last BM: PTA   Labs:   Recent Labs Lab 02/03/14 1937 02/04/14 0335  NA 133* 134*  K 4.5 4.0  CL 93* 92*  CO2 29 29  BUN 8 12  CREATININE 0.56 0.84  CALCIUM 9.4 8.8  GLUCOSE 99 215*    CBG (last 3)   Recent Labs  02/04/14 0453 02/04/14 0825  GLUCAP 202* 159*    Scheduled Meds: . antiseptic oral rinse  7 mL Mouth Rinse QID  . aspirin EC  81 mg Oral Daily  . atorvastatin  10 mg Oral q1800  . chlorhexidine  15 mL Mouth Rinse BID  . enoxaparin (LOVENOX) injection  40 mg Subcutaneous Q24H  . furosemide  40 mg Intravenous Q12H  . insulin aspart  2-6 Units Subcutaneous 6 times per day  . ipratropium-albuterol  3 mL Nebulization Q6H  . lidocaine (cardiac) 100 mg/17ml      . methylPREDNISolone (SOLU-MEDROL) injection  60 mg Intravenous Q12H  . pantoprazole (PROTONIX) IV  40 mg Intravenous QHS  . rocuronium      . sodium chloride  3 mL Intravenous Q12H    Continuous Infusions: .  dexmedetomidine 0.8 mcg/kg/hr (02/04/14 0800)  . norepinephrine (LEVOPHED) Adult infusion Stopped (02/04/14 1540)    Past Medical History  Diagnosis Date  . GERD (gastroesophageal reflux disease)   . Schizophrenia   . Tobacco abuse   . Hyperlipemia   . Hypercholesteremia   . CHF (congestive heart failure)   . Diabetes mellitus without complication     Past Surgical History  Procedure Laterality Date  . None      [place], [place], [place] Pager [phone] After Hours Pager

## 2014-02-04 NOTE — Plan of Care (Signed)
Problem: Phase I Progression Outcomes Goal: Pneumonia/flu vaccination screen completed Outcome: Not Met (add Reason) Unable to complete screening as part of admission history. Patient arrived intubated and sedated, no family or visitors present. Per report, patient lives in group home facility, unable to obtain additional medical information at this time.

## 2014-02-04 NOTE — ED Notes (Signed)
Call [facility]iology for Dr. Sabra Heck

## 2014-02-04 NOTE — ED Notes (Signed)
MD Sabra Heck at bedside evaluating patient, aware of patient drop in blood pressure. Admitting MD aware of patient status. Pt lethargic, and hypotensive.

## 2014-02-04 NOTE — Progress Notes (Signed)
Utilization review completed. Rayven Hendrickson, RN, BSN. 

## 2014-02-04 NOTE — Progress Notes (Signed)
ABG w/  panic results from 01/25/2014 at 0950 called to Dr. Elsworth Soho.  Per MD, no vent changes ordered for now, continue current care.  VSS, no resp distress noted.

## 2014-02-04 NOTE — ED Notes (Signed)
Dr. Sabra Heck at bedside preparing to intubate.

## 2014-02-04 NOTE — Progress Notes (Signed)
Georgann Housekeeper, NP at bedside to place right IJ central venous catheter. Time out completed per universal protocol, line placed emergently, no family or visitors present, patient intubated and sedated. Will continue to assess and monitor patient closely.

## 2014-02-04 NOTE — Progress Notes (Signed)
Subjective:  Pt intubated and sedated, on precedex, arousable and follows commands. VDRF  Objective:  Temp:  [97.9 F (36.6 C)-99.5 F (37.5 C)] 99.3 F (37.4 C) (09/16 1007) Pulse Rate:  [27-115] 59 (09/16 1007) Resp:  [12-32] 24 (09/16 1007) BP: (82-153)/(44-110) 132/106 mmHg (09/16 1000) SpO2:  [86 %-99 %] 96 % (09/16 1007) FiO2 (%):  [40 %-100 %] 60 % (09/16 0931) Weight:  [208 lb 5.4 oz (94.5 kg)-209 lb 7 oz (95 kg)] 208 lb 5.4 oz (94.5 kg) (09/16 0225) Weight change:   Intake/Output from previous day: 09/15 0701 - 09/16 0700 In: 63.8 [I.V.:53.8] Out: 1945 [Urine:1545; Emesis/NG output:400]  Intake/Output from this shift: Total I/O In: 37.9 [I.V.:37.9] Out: 400 [Urine:400]  Physical Exam: General appearance: alert, no distress and toxic Neck: no adenopathy, no carotid bruit, no JVD, supple, symmetrical, trachea midline and thyroid not enlarged, symmetric, no tenderness/mass/nodules Lungs: clear to auscultation bilaterally Heart: regular rate and rhythm, S1, S2 normal, no murmur, click, rub or gallop Extremities: extremities normal, atraumatic, no cyanosis or edema  Lab Results: Results for orders placed during the hospital encounter of 02/03/14 (from the past 48 hour(s))  PRO B NATRIURETIC PEPTIDE     Status: Abnormal   Collection Time    02/03/14  7:37 PM      Result Value Ref Range   Pro B Natriuretic peptide (BNP) 2600.0 (*) 0 - 125 pg/mL  BASIC METABOLIC PANEL     Status: Abnormal   Collection Time    02/03/14  7:37 PM      Result Value Ref Range   Sodium 133 (*) 137 - 147 mEq/L   Potassium 4.5  3.7 - 5.3 mEq/L   Chloride 93 (*) 96 - 112 mEq/L   CO2 29  19 - 32 mEq/L   Glucose, Bld 99  70 - 99 mg/dL   BUN 8  6 - 23 mg/dL   Creatinine, Ser 0.56  0.50 - 1.10 mg/dL   Calcium 9.4  8.4 - 10.5 mg/dL   GFR calc non Af Amer >90  >90 mL/min   GFR calc Af Amer >90  >90 mL/min   Comment: (NOTE)     The eGFR has been calculated using the CKD EPI equation.       This calculation has not been validated in all clinical situations.     eGFR's persistently <90 mL/min signify possible Chronic Kidney     Disease.   Anion gap 11  5 - 15  CBC     Status: Abnormal   Collection Time    02/03/14  7:37 PM      Result Value Ref Range   WBC 10.5  4.0 - 10.5 K/uL   RBC 5.22 (*) 3.87 - 5.11 MIL/uL   Hemoglobin 17.0 (*) 12.0 - 15.0 g/dL   HCT 49.8 (*) 36.0 - 46.0 %   MCV 95.4  78.0 - 100.0 fL   MCH 32.6  26.0 - 34.0 pg   MCHC 34.1  30.0 - 36.0 g/dL   RDW 13.0  11.5 - 15.5 %   Platelets 198  150 - 400 K/uL  TROPONIN I     Status: None   Collection Time    02/03/14  7:37 PM      Result Value Ref Range   Troponin I <0.30  <0.30 ng/mL   Comment:            Due to the release kinetics of cTnI,  a negative result within the first hours     of the onset of symptoms does not rule out     myocardial infarction with certainty.     If myocardial infarction is still suspected,     repeat the test at appropriate intervals.  I-STAT ARTERIAL BLOOD GAS, ED     Status: Abnormal   Collection Time    02/03/14 10:54 PM      Result Value Ref Range   pH, Arterial 7.256 (*) 7.350 - 7.450   pCO2 arterial 71.3 (*) 35.0 - 45.0 mmHg   pO2, Arterial 79.0 (*) 80.0 - 100.0 mmHg   Bicarbonate 31.7 (*) 20.0 - 24.0 mEq/L   TCO2 34  0 - 100 mmol/L   O2 Saturation 93.0     Acid-Base Excess 1.0  0.0 - 2.0 mmol/L   Patient temperature 98.7 F     Collection site RADIAL, ALLEN'S TEST ACCEPTABLE     Drawn by Operator     Sample type ARTERIAL     Comment NOTIFIED PHYSICIAN    I-STAT ARTERIAL BLOOD GAS, ED     Status: Abnormal   Collection Time    02/04/14 12:03 AM      Result Value Ref Range   pH, Arterial 7.225 (*) 7.350 - 7.450   pCO2 arterial 83.7 (*) 35.0 - 45.0 mmHg   pO2, Arterial 155.0 (*) 80.0 - 100.0 mmHg   Bicarbonate 34.7 (*) 20.0 - 24.0 mEq/L   TCO2 37  0 - 100 mmol/L   O2 Saturation 99.0     Acid-Base Excess 3.0 (*) 0.0 - 2.0 mmol/L   Patient temperature  98.6 F     Collection site RADIAL, ALLEN'S TEST ACCEPTABLE     Drawn by Operator     Sample type ARTERIAL     Comment NOTIFIED PHYSICIAN    I-STAT ARTERIAL BLOOD GAS, ED     Status: Abnormal   Collection Time    02/04/14  1:57 AM      Result Value Ref Range   pH, Arterial 7.290 (*) 7.350 - 7.450   pCO2 arterial 68.9 (*) 35.0 - 45.0 mmHg   pO2, Arterial 230.0 (*) 80.0 - 100.0 mmHg   Bicarbonate 33.1 (*) 20.0 - 24.0 mEq/L   TCO2 35  0 - 100 mmol/L   O2 Saturation 100.0     Acid-Base Excess 3.0 (*) 0.0 - 2.0 mmol/L   Patient temperature 98.6 F     Collection site RADIAL, ALLEN'S TEST ACCEPTABLE     Drawn by Operator     Sample type ARTERIAL     Comment NOTIFIED PHYSICIAN    MRSA PCR SCREENING     Status: None   Collection Time    02/04/14  2:23 AM      Result Value Ref Range   MRSA by PCR NEGATIVE  NEGATIVE   Comment:            The GeneXpert MRSA Assay (FDA     approved for NASAL specimens     only), is one component of a     comprehensive MRSA colonization     surveillance program. It is not     intended to diagnose MRSA     infection nor to guide or     monitor treatment for     MRSA infections.  BASIC METABOLIC PANEL     Status: Abnormal   Collection Time    02/04/14  3:35 AM      Result  Value Ref Range   Sodium 134 (*) 137 - 147 mEq/L   Potassium 4.0  3.7 - 5.3 mEq/L   Chloride 92 (*) 96 - 112 mEq/L   CO2 29  19 - 32 mEq/L   Glucose, Bld 215 (*) 70 - 99 mg/dL   BUN 12  6 - 23 mg/dL   Creatinine, Ser 0.84  0.50 - 1.10 mg/dL   Calcium 8.8  8.4 - 10.5 mg/dL   GFR calc non Af Amer 75 (*) >90 mL/min   GFR calc Af Amer 87 (*) >90 mL/min   Comment: (NOTE)     The eGFR has been calculated using the CKD EPI equation.     This calculation has not been validated in all clinical situations.     eGFR's persistently <90 mL/min signify possible Chronic Kidney     Disease.   Anion gap 13  5 - 15  TROPONIN I     Status: None   Collection Time    02/04/14  3:35 AM       Result Value Ref Range   Troponin I <0.30  <0.30 ng/mL   Comment:            Due to the release kinetics of cTnI,     a negative result within the first hours     of the onset of symptoms does not rule out     myocardial infarction with certainty.     If myocardial infarction is still suspected,     repeat the test at appropriate intervals.  TSH     Status: None   Collection Time    02/04/14  3:35 AM      Result Value Ref Range   TSH 0.673  0.350 - 4.500 uIU/mL  CBC WITH DIFFERENTIAL     Status: Abnormal   Collection Time    02/04/14  3:35 AM      Result Value Ref Range   WBC 7.0  4.0 - 10.5 K/uL   RBC 5.03  3.87 - 5.11 MIL/uL   Hemoglobin 16.2 (*) 12.0 - 15.0 g/dL   HCT 49.2 (*) 36.0 - 46.0 %   MCV 97.8  78.0 - 100.0 fL   MCH 32.2  26.0 - 34.0 pg   MCHC 32.9  30.0 - 36.0 g/dL   RDW 13.4  11.5 - 15.5 %   Platelets 187  150 - 400 K/uL   Neutrophils Relative % 95 (*) 43 - 77 %   Neutro Abs 6.6  1.7 - 7.7 K/uL   Lymphocytes Relative 4 (*) 12 - 46 %   Lymphs Abs 0.3 (*) 0.7 - 4.0 K/uL   Monocytes Relative 1 (*) 3 - 12 %   Monocytes Absolute 0.1  0.1 - 1.0 K/uL   Eosinophils Relative 0  0 - 5 %   Eosinophils Absolute 0.0  0.0 - 0.7 K/uL   Basophils Relative 0  0 - 1 %   Basophils Absolute 0.0  0.0 - 0.1 K/uL  GLUCOSE, CAPILLARY     Status: Abnormal   Collection Time    02/04/14  4:53 AM      Result Value Ref Range   Glucose-Capillary 202 (*) 70 - 99 mg/dL  BLOOD GAS, ARTERIAL     Status: Abnormal   Collection Time    02/04/14  5:00 AM      Result Value Ref Range   FIO2 0.60     Delivery systems VENTILATOR     Mode  PRESSURE REGULATED VOLUME CONTROL     VT 350     Rate 22     Peep/cpap 5.0     pH, Arterial 7.265 (*) 7.350 - 7.450   pCO2 arterial 73.9 (*) 35.0 - 45.0 mmHg   Comment: CRITICAL RESULT CALLED TO, READ BACK BY AND VERIFIED WITH:     STEPHANIE MITCHEM,RN AT 4562, BY ARIELLE FOWLER RRT, RCP ON 02/04/2014   pO2, Arterial 96.9  80.0 - 100.0 mmHg   Bicarbonate  32.4 (*) 20.0 - 24.0 mEq/L   TCO2 34.7  0 - 100 mmol/L   Acid-Base Excess 5.8 (*) 0.0 - 2.0 mmol/L   O2 Saturation 96.6     Patient temperature 98.6     Collection site RIGHT RADIAL     Drawn by [phone]     Sample type ARTERIAL DRAW     Allens test (pass/fail) PASS  PASS  GLUCOSE, CAPILLARY     Status: Abnormal   Collection Time    02/04/14  8:25 AM      Result Value Ref Range   Glucose-Capillary 159 (*) 70 - 99 mg/dL  TROPONIN I     Status: None   Collection Time    02/04/14  8:50 AM      Result Value Ref Range   Troponin I <0.30  <0.30 ng/mL   Comment:            Due to the release kinetics of cTnI,     a negative result within the first hours     of the onset of symptoms does not rule out     myocardial infarction with certainty.     If myocardial infarction is still suspected,     repeat the test at appropriate intervals.  POCT I-STAT 3, ART BLOOD GAS (G3+)     Status: Abnormal   Collection Time    02/04/14  9:50 AM      Result Value Ref Range   pH, Arterial 7.387  7.350 - 7.450   pCO2 arterial 62.7 (*) 35.0 - 45.0 mmHg   pO2, Arterial 87.0  80.0 - 100.0 mmHg   Bicarbonate 37.6 (*) 20.0 - 24.0 mEq/L   TCO2 39  0 - 100 mmol/L   O2 Saturation 96.0     Acid-Base Excess 9.0 (*) 0.0 - 2.0 mmol/L   Patient temperature 37.4 C     Collection site RADIAL, ALLEN'S TEST ACCEPTABLE     Drawn by Operator     Sample type ARTERIAL     Comment NOTIFIED PHYSICIAN      Imaging: Imaging results have been reviewed  Assessment/Plan:   1. Active Problems: 2.   Respiratory failure 3.   Hypotension, unspecified 4.   Altered mental status 5.   Acute respiratory failure 6. EKG abnormalities 7. COPD 8. Schizophrenia  Time Spent Directly with Patient:  25 minutes  Length of Stay:  LOS: 1 day   Pt admitted with respiratory insufficiency requiring BiPAP followed by intubation. H/O COPD and tobacco abuse. PCCM following and helping with Vent. Enz neg. Labs OK otherwise. BNP 2600.  Good diuresis (-2.3 liters). EKG with Anterolateral TWI worrisome for LAD distribution ischemia. On lovenox for DVT prophylaxis. 2D pending. Will ultimately require cath once extubated to define anatomy. Suspect event multifactorial secondary to COPD with superimposed ischemia.   Lorretta Harp 02/04/2014, 10:23 AM

## 2014-02-04 NOTE — Progress Notes (Signed)
Echocardiogram 2D Echocardiogram has been performed.  Joelene Millin 02/04/2014, 3:08 PM

## 2014-02-04 NOTE — Progress Notes (Signed)
MD made vent changes, peep 10, rate 26.

## 2014-02-04 NOTE — Progress Notes (Signed)
Unable to complete admission history or vaccination screenings at this time. Patient arrived to 2 Heart intubated and sedated, no family or visitors present and no family listed as emergency contact.

## 2014-02-04 NOTE — ED Notes (Signed)
MD Sabra Heck and RT at bedside evaluating patient. Pt breathing has gotten worse. New orders for neb obtained.

## 2014-02-04 NOTE — ED Notes (Signed)
Pt tacypnic and lethargic. Breathing much worse, pt diaphoretic. MD Sabra Heck at bedside evaluating patient status.

## 2014-02-04 NOTE — Consult Note (Signed)
PULMONARY / CRITICAL CARE MEDICINE   Name: TARICA HARL MRN: [id number] DOB: 1955-11-03    ADMISSION DATE:  02/03/2014 CONSULTATION DATE:  02/03/2014  REFERRING MD :  Dr. Elias Else  CHIEF COMPLAINT:  Dyspnea  INITIAL PRESENTATION: 58 year old female presented to [facility] 9/15 c/o SOB from urgent care where she was requiring 5L O2. Has not taken lasix for 4 days, CXR c/w Edema. Intubated in ED. PCCM asked to see for vent management.   STUDIES:    SIGNIFICANT EVENTS: 9/15 > intubated, to ICU.   HISTORY OF PRESENT ILLNESS:  Obtained from medical record as patient is intubated.  58 year old female with PMH as below, which includes DM2, tobacco abuse, presumed COPD, s/p remote thoracentesis for parapneumonic effusion, ?CHF (on lasix for LE edema) and schizophrenia. She presented to [facility] ED 9/15 with SOB and fatigue x 4 days. Productive cough and fevers started 9/15 so she presented initially to urgent care, but was requiring 5L O2 so she was transferred to ED. In ED she was placed on BiPAP. CXR was consistent with edema. She eventually required intubation. She was admitted to cardiology service, but PCCM has been asked to assist with vent management.    PAST MEDICAL HISTORY :  Past Medical History  Diagnosis Date  . GERD (gastroesophageal reflux disease)   . Schizophrenia   . Tobacco abuse   . Hyperlipemia   . Hypercholesteremia   . CHF (congestive heart failure)   . Diabetes mellitus without complication    Past Surgical History  Procedure Laterality Date  . None     Prior to Admission medications   Medication Sig Start Date End Date Taking? Authorizing Provider  albuterol (PROVENTIL HFA;VENTOLIN HFA) 108 (90 BASE) MCG/ACT inhaler Inhale 2 puffs into the lungs every 6 (six) hours as needed for shortness of breath.    Yes Historical Provider, MD  cholecalciferol (VITAMIN D) 400 UNITS TABS Take 400 Units by mouth daily.   Yes Historical Provider, MD  furosemide (LASIX) 20 MG tablet Take  20 mg by mouth daily as needed for fluid.    Yes Historical Provider, MD  metFORMIN (GLUCOPHAGE-XR) 750 MG 24 hr tablet Take 750 mg by mouth daily with breakfast.   Yes Historical Provider, MD  risperiDONE (RISPERDAL) 1 MG tablet Take 2 mg by mouth at bedtime.    Yes Historical Provider, MD  rosuvastatin (CRESTOR) 5 MG tablet Take 5 mg by mouth daily.   Yes Historical Provider, MD   Allergies  Allergen Reactions  . Penicillins Nausea And Vomiting    Large doses  . Quetiapine     REACTION: nausea/dizzy    FAMILY HISTORY:  History reviewed. No pertinent family history. SOCIAL HISTORY:  reports that she has been smoking Cigarettes.  She has a 21 pack-year smoking history. She has never used smokeless tobacco. She reports that she does not drink alcohol or use illicit drugs.  REVIEW OF SYSTEMS:  Unable, intubated  SUBJECTIVE:   VITAL SIGNS: Temp:  [99.4 F (37.4 C)] 99.4 F (37.4 C) (09/15 1758) Pulse Rate:  [76-115] 107 (09/16 0020) Resp:  [18-32] 22 (09/16 0046) BP: (90-153)/(51-110) 125/73 mmHg (09/16 0046) SpO2:  [86 %-99 %] 98 % (09/16 0046) FiO2 (%):  [60 %-100 %] 100 % (09/16 0035) Weight:  [95 kg (209 lb 7 oz)] 95 kg (209 lb 7 oz) (09/16 0032) HEMODYNAMICS:   VENTILATOR SETTINGS: Vent Mode:  [-] PRVC FiO2 (%):  [60 %-100 %] 100 % Set Rate:  [  10 bmp-20 bmp] 20 bmp Vt Set:  [350 mL] 350 mL PEEP:  [5 cmH20] 5 cmH20 INTAKE / OUTPUT:  Intake/Output Summary (Last 24 hours) at 02/04/14 0056 Last data filed at 02/03/14 2233  Gross per 24 hour  Intake      0 ml  Output    500 ml  Net   -500 ml    PHYSICAL EXAMINATION: General:  Obese female in NAD on vent.  Neuro:  Rass -2, sedated on vent. HEENT:  /AT, no JVD noted Cardiovascular:  RRR, borderline tachy. No MRG noted Lungs:  Crackles/rhonchi throughout. Synchronous with vent. Abdomen:  Obese, soft, non-disteneded Musculoskeletal:  No acute deformity. +2 pitting edema to BLE Skin:  Intact,  MMM  LABS:  CBC  Recent Labs Lab 02/03/14 1937  WBC 10.5  HGB 17.0*  HCT 49.8*  PLT 198   Coag's No results found for this basename: APTT, INR,  in the last 168 hours BMET  Recent Labs Lab 02/03/14 1937  NA 133*  K 4.5  CL 93*  CO2 29  BUN 8  CREATININE 0.56  GLUCOSE 99   Electrolytes  Recent Labs Lab 02/03/14 1937  CALCIUM 9.4   Sepsis Markers No results found for this basename: LATICACIDVEN, PROCALCITON, O2SATVEN,  in the last 168 hours ABG  Recent Labs Lab 02/03/14 2254 02/04/14 0003  PHART 7.256* 7.225*  PCO2ART 71.3* 83.7*  PO2ART 79.0* 155.0*   Liver Enzymes No results found for this basename: AST, ALT, ALKPHOS, BILITOT, ALBUMIN,  in the last 168 hours Cardiac Enzymes  Recent Labs Lab 02/03/14 1937  TROPONINI <0.30  PROBNP 2600.0*   Glucose No results found for this basename: GLUCAP,  in the last 168 hours  Imaging Dg Chest 2 View  02/03/2014   CLINICAL DATA:  Shortness of breath.  EXAM: CHEST  2 VIEW  COMPARISON:  06/30/2011  FINDINGS: Stable moderate cardiomegaly. Mediastinal contours are normal. There is prominence of the central pulmonary vasculature and peripheral pulmonary venous congestion. Slight prominence of interstitial lung markings bilaterally. Very small bilateral pleural effusions the posterior costophrenic angles.  No consolidation or pneumothorax. Trachea midline. Bones osteopenic.  IMPRESSION: Mild congestive heart failure pattern.   Electronically Signed   By: Curlene Dolphin M.D.   On: 02/03/2014 20:49     ASSESSMENT / PLAN:  PULMONARY OETT 9/15>>> A: Acute hypercarbic respiratory failure Pulmonary edema H/o ? COPD without evidence of acute exacerbation  P:   Full vent support Follow ABG/CXR Daily SBT Pulmonary Hygeine PRN BD Solumedrol 60 mg IV q12 as ordered, will taper quickly pending respiratory status Repeat ABG now (FiO2 increased to 60% post intubation for low sats but PaO2 on ABG is  230)..  CARDIOVASCULAR A:  Acute on likely chronic CHF Nonspecific EKG changes Hypotension, concern for cardiogenic shock.  H/o HL  P:  Management per cardiology Diuresis as BP tolerates F/u Echo Trending troponin D/c propofol, transition to precedex. Continuing preadmission statin Levophed for BP support after placement of TLC  RENAL A:   No acute issues  P:   Follow Bmet Replace K as needed with diuresis  GASTROINTESTINAL A:   H/o GERD  P:   SUP: IV protonix  HEMATOLOGIC A:   No acute issues  P:  Follow CBC VTE ppx: Heparin SQ  INFECTIOUS A:   SIRS  P:   Monitor off ABX  ENDOCRINE A:   DM 2   P:   Holding metformin CBG monitoring, SSI  NEUROLOGIC A:  Acute encephalopathy in setting of hypercarbia P:   RASS goal: -1 Precedex for sedation in setting of hypotension.  Fentanyl PRN for analgesia Daily WUA  Georgann Housekeeper, ACNP [place] Pulmonology/Critical Care Pager [phone] or [phone]  BP remains marginal at best even off propofol.  Will place TLC, check CVPs, hold any weaning trials for now, needs aggressive diureses if BP allows, start levophed, repeat ABG and will f/u.  I have personally obtained a history, examined the patient, evaluated laboratory and imaging results, formulated the assessment and plan and placed orders.  CRITICAL CARE: The patient is critically ill with multiple organ systems failure and requires high complexity decision making for assessment and support, frequent evaluation and titration of therapies, application of advanced monitoring technologies and extensive interpretation of multiple databases. Critical Care Time devoted to patient care services described in this note is 45 minutes.   Rush Farmer, M.D. [facility] Pulmonary/Critical Care Medicine. Pager: [phone]. After hours pager: [phone].

## 2014-02-04 NOTE — Progress Notes (Signed)
Patient transported to 2H14 without any complications.

## 2014-02-04 NOTE — Progress Notes (Signed)
CARE MANAGEMENT NOTE 02/04/2014  Patient:  [facility]   Account Number:  [id number]  Date Initiated:  02/04/2014  Documentation initiated by:  DAVIS,RHONDA  Subjective/Objective Assessment:   58 year old female presented to [facility] 9/15 c/o SOB from urgent care where she was requiring 5L O2. Has not taken Lasix for 4 days, CXR c/w Edema. Intubated in ED. sedated     Action/Plan:   paptient is from a group home under [facility].Active Problems:    Respiratory failure    Hypotension, unspecified    Altered mental status    Acute respiratory failure  EKG abnormalities  COPD  Schizophrenia   Anticipated DC Date:  02/07/2014   Anticipated DC Plan:  GROUP HOME  In-house referral  Clinical Social Worker      DC Planning Services  CM consult      [facility] Choice  NA   Choice offered to / List presented to:  NA   DME arranged  NA      DME agency  NA     [place] arranged  NA      [place] agency  NA   Status of service:  In process, will continue to follow Medicare Important Message given?   (If response is "NO", the following Medicare IM given date fields will be blank) Date Medicare IM given:   Medicare IM given by:   Date Additional Medicare IM given:   Additional Medicare IM given by:    Discharge Disposition:    Per UR Regulation:  Reviewed for med. necessity/level of care/duration of stay  If discussed at [place] of Stay Meetings, dates discussed:    Comments:  [id number]/Rhonda Davis,RN,BSN,CCM:

## 2014-02-05 ENCOUNTER — Encounter (HOSPITAL_COMMUNITY): Payer: Self-pay | Admitting: Neurology

## 2014-02-05 ENCOUNTER — Inpatient Hospital Stay (HOSPITAL_COMMUNITY): Payer: Medicare Other

## 2014-02-05 DIAGNOSIS — I509 Heart failure, unspecified: Secondary | ICD-10-CM | POA: Diagnosis not present

## 2014-02-05 DIAGNOSIS — J9600 Acute respiratory failure, unspecified whether with hypoxia or hypercapnia: Secondary | ICD-10-CM

## 2014-02-05 DIAGNOSIS — R0989 Other specified symptoms and signs involving the circulatory and respiratory systems: Secondary | ICD-10-CM

## 2014-02-05 DIAGNOSIS — R0609 Other forms of dyspnea: Secondary | ICD-10-CM

## 2014-02-05 LAB — GLUCOSE, CAPILLARY
GLUCOSE-CAPILLARY: 148 mg/dL — AB (ref 70–99)
GLUCOSE-CAPILLARY: 120 mg/dL — AB (ref 70–99)
Glucose-Capillary: 137 mg/dL — ABNORMAL HIGH (ref 70–99)
Glucose-Capillary: 138 mg/dL — ABNORMAL HIGH (ref 70–99)
Glucose-Capillary: 175 mg/dL — ABNORMAL HIGH (ref 70–99)
Glucose-Capillary: 176 mg/dL — ABNORMAL HIGH (ref 70–99)

## 2014-02-05 LAB — BLOOD GAS, ARTERIAL
ACID-BASE EXCESS: 11 mmol/L — AB (ref 0.0–2.0)
Bicarbonate: 35.8 mEq/L — ABNORMAL HIGH (ref 20.0–24.0)
DRAWN BY: 40415
FIO2: 0.5 %
LHR: 26 {breaths}/min
MECHVT: 360 mL
O2 SAT: 97.7 %
PEEP/CPAP: 10 cmH2O
PO2 ART: 92.4 mmHg (ref 80.0–100.0)
Patient temperature: 98.6
TCO2: 37.4 mmol/L (ref 0–100)
pCO2 arterial: 53.8 mmHg — ABNORMAL HIGH (ref 35.0–45.0)
pH, Arterial: 7.438 (ref 7.350–7.450)

## 2014-02-05 LAB — BASIC METABOLIC PANEL
Anion gap: 12 (ref 5–15)
BUN: 24 mg/dL — ABNORMAL HIGH (ref 6–23)
CO2: 33 mEq/L — ABNORMAL HIGH (ref 19–32)
Calcium: 9.9 mg/dL (ref 8.4–10.5)
Chloride: 92 mEq/L — ABNORMAL LOW (ref 96–112)
Creatinine, Ser: 0.65 mg/dL (ref 0.50–1.10)
GFR calc Af Amer: >90 mL/min (ref >90)
GLUCOSE: 124 mg/dL — AB (ref 70–99)
POTASSIUM: 4.2 meq/L (ref 3.7–5.3)
Sodium: 137 mEq/L (ref 137–147)

## 2014-02-05 LAB — PHOSPHORUS: Phosphorus: 3.1 mg/dL (ref 2.3–4.6)

## 2014-02-05 LAB — CBC
HCT: 51.4 % — ABNORMAL HIGH (ref 36.0–46.0)
HEMOGLOBIN: 17.4 g/dL — AB (ref 12.0–15.0)
MCH: 32.6 pg (ref 26.0–34.0)
MCHC: 33.9 g/dL (ref 30.0–36.0)
MCV: 96.3 fL (ref 78.0–100.0)
PLATELETS: 199 10*3/uL (ref 150–400)
RBC: 5.34 MIL/uL — AB (ref 3.87–5.11)
RDW: 13.3 % (ref 11.5–15.5)
WBC: 22.2 10*3/uL — AB (ref 4.0–10.5)

## 2014-02-05 LAB — MAGNESIUM: Magnesium: 2 mg/dL (ref 1.5–2.5)

## 2014-02-05 MED ORDER — RISPERIDONE 1 MG/ML PO SOLN
2.0000 mg | Freq: Every day | ORAL | Status: DC
  Administered 2014-02-05 – 2014-02-09 (×5): 2 mg
  Filled 2014-02-05 (×6): qty 2

## 2014-02-05 NOTE — Progress Notes (Signed)
Placed pt on vent wean d/t peep and fio2 weaned down today, and per MD plans for vent to be weaned this afternoon once fio2 and peep weaned.  Pt tol well so far, sat 91-92%.  No resp distress noted.

## 2014-02-05 NOTE — Progress Notes (Signed)
PULMONARY / CRITICAL CARE MEDICINE   Name: Kim [name] MRN: [id number] DOB: Dec 19, 1955    ADMISSION DATE:  02/03/2014 CONSULTATION DATE:  02/03/2014  REFERRING MD :  Dr. Elias Else  CHIEF COMPLAINT:  Dyspnea  INITIAL PRESENTATION: 58 year old female presented to [facility] 9/15 c/o SOB from urgent care where she was requiring 5L O2. Has not taken lasix for 4 days, CXR c/w Edema. Intubated in ED. PCCM asked to see for vent management.   STUDIES:    SIGNIFICANT EVENTS: 9/15 > intubated, to ICU.   SUBJECTIVE: No events overnight, much more alert and interactive this AM.  VITAL SIGNS: Temp:  [97.3 F (36.3 C)-99.9 F (37.7 C)] 97.7 F (36.5 C) (09/17 0800) Pulse Rate:  [43-88] 53 (09/17 0827) Resp:  [17-29] 27 (09/17 0827) BP: (93-184)/(51-111) 149/93 mmHg (09/17 0800) SpO2:  [91 %-98 %] 91 % (09/17 0827) FiO2 (%):  [40 %-60 %] 40 % (09/17 0827) Weight:  [91.3 kg (201 lb 4.5 oz)] 91.3 kg (201 lb 4.5 oz) (09/17 0400) HEMODYNAMICS: CVP:  [5 mmHg-12 mmHg] 6 mmHg VENTILATOR SETTINGS: Vent Mode:  [-] PRVC FiO2 (%):  [40 %-60 %] 40 % Set Rate:  [26 bmp] 26 bmp Vt Set:  [360 mL] 360 mL PEEP:  [10 cmH20] 10 cmH20 Plateau Pressure:  [15 BTD17-61 cmH20] 15 cmH20 INTAKE / OUTPUT:  Intake/Output Summary (Last 24 hours) at 02/05/14 1006 Last data filed at 02/05/14 0900  Gross per 24 hour  Intake 1179.4 ml  Output   2075 ml  Net -895.6 ml    PHYSICAL EXAMINATION: General:  Obese female in NAD on vent.  Neuro:  Alert and interactive, writing notes HEENT:  [place]/AT, no JVD noted Cardiovascular:  RRR, borderline tachy. No MRG noted Lungs:  Crackles/rhonchi throughout. Synchronous with vent. Abdomen:  Obese, soft, non-disteneded Musculoskeletal:  No acute deformity. +2 pitting edema to BLE Skin:  Intact, MMM  LABS:  CBC  Recent Labs Lab 02/03/14 1937 02/04/14 0335 02/05/14 0500  WBC 10.5 7.0 22.2*  HGB 17.0* 16.2* 17.4*  HCT 49.8* 49.2* 51.4*  PLT 198 187 199   Coag's No  results found for this basename: APTT, INR,  in the last 168 hours BMET  Recent Labs Lab 02/03/14 1937 02/04/14 0335 02/05/14 0500  NA 133* 134* 137  K 4.5 4.0 4.2  CL 93* 92* 92*  CO2 29 29 33*  BUN 8 12 24*  CREATININE 0.56 0.84 0.65  GLUCOSE 99 215* 124*   Electrolytes  Recent Labs Lab 02/03/14 1937 02/04/14 0335 02/05/14 0500  CALCIUM 9.4 8.8 9.9  MG  --   --  2.0  PHOS  --   --  3.1   Sepsis Markers No results found for this basename: LATICACIDVEN, PROCALCITON, O2SATVEN,  in the last 168 hours ABG  Recent Labs Lab 02/04/14 0500 02/04/14 0950 02/05/14 0427  PHART 7.265* 7.387 7.438  PCO2ART 73.9* 62.7* 53.8*  PO2ART 96.9 87.0 92.4   Liver Enzymes No results found for this basename: AST, ALT, ALKPHOS, BILITOT, ALBUMIN,  in the last 168 hours Cardiac Enzymes  Recent Labs Lab 02/03/14 1937 02/04/14 0335 02/04/14 0850 02/04/14 1500  TROPONINI <0.30 <0.30 <0.30 <0.30  PROBNP 2600.0*  --   --   --    Glucose  Recent Labs Lab 02/04/14 1218 02/04/14 1622 02/04/14 2000 02/05/14 0004 02/05/14 0433 02/05/14 0820  GLUCAP 159* 124* 179* 137* 138* 148*    Imaging Dg Chest Port 1 View  02/04/2014   CLINICAL  DATA:  Central line placement.  EXAM: PORTABLE CHEST - 1 VIEW  COMPARISON:  Chest radiograph performed earlier today at 12:32 a.m.  FINDINGS: The patient's endotracheal tube is seen ending 3-4 cm above the carina. A right IJ line is noted ending about the mid SVC. The enteric tube is noted extending below the diaphragm.  There is an increasing small left pleural effusion, with new left basilar airspace opacification, which may reflect pneumonia or possibly atelectasis. Mild right basilar atelectasis is seen. No pneumothorax is identified. Vascular congestion is noted.  The cardiomediastinal silhouette is mildly enlarged. No acute osseous abnormalities are seen.  IMPRESSION: 1. Right IJ line noted ending about the mid SVC. 2. Endotracheal tube seen ending  3-4 cm above the carina. 3. Increasing small left pleural effusion, with new left basilar airspace opacification, which may reflect pneumonia or possibly atelectasis. Mild right basilar atelectasis seen. 4. Vascular congestion and mild cardiomegaly.   Electronically Signed   By: Garald Balding M.D.   On: 02/04/2014 05:43   Dg Chest Port 1 View  02/04/2014   CLINICAL DATA:  Endotracheal tube placement.  EXAM: PORTABLE CHEST - 1 VIEW  COMPARISON:  Chest radiograph performed 02/03/2014  FINDINGS: The endotracheal tube is seen ending 2-3 cm above the carina.  Lung expansion is mildly decreased. Vascular crowding and vascular congestion are seen. Mildly increased interstitial markings again raise concern for mild interstitial edema. No pleural effusion or pneumothorax is seen.  The cardiomediastinal silhouette is mildly enlarged. No acute osseous abnormalities are identified.  IMPRESSION: 1. Endotracheal tube seen ending 2-3 cm above the carina. 2. Vascular congestion and mild cardiomegaly; mildly increased interstitial markings again concerning for mild interstitial edema.   Electronically Signed   By: Garald Balding M.D.   On: 02/04/2014 01:37   Dg Abd Portable 1v  02/04/2014   CLINICAL DATA:  OG tube placement  EXAM: PORTABLE ABDOMEN - 1 VIEW  COMPARISON:  12/15/2010; chest radiograph- 02/04/2014  FINDINGS: Enteric tube tip and side port projects over the expected location of the mid body of the stomach. Interval decrease in previously noted marked gaseous distention of the stomach.  There is persistent moderate gaseous distention of the colon with index loop of descending colon measuring approximately 6.6 cm in diameter. There is very mild gaseous distention of the distal small bowel.  No supine evidence of pneumoperitoneum. No pneumatosis or portal venous gas.  No definite abnormal intra-abdominal calcifications.  Limited visualization of the lower thorax demonstrates bibasilar heterogeneous opacities, left  greater than right with suspected trace bilateral effusions.  No acute osseus abnormalities.  IMPRESSION: 1. Enteric tube tip and side port projects over the mid body of the stomach. 2. Persistent finding suggestive of ileus.   Electronically Signed   By: Sandi Mariscal M.D.   On: 02/04/2014 08:00     ASSESSMENT / PLAN:  PULMONARY OETT 9/15>>> A: Acute hypercarbic respiratory failure Pulmonary edema H/o ? COPD without evidence of acute exacerbation  P:   Decrease PEEP to 5 and if tolerated will begin PS trials Follow ABG/CXR Pulmonary Hygeine PRN BD Solumedrol 60 mg IV q12 as ordered, will taper quickly pending respiratory status Diureses as below  CARDIOVASCULAR A:  Acute on likely chronic CHF Nonspecific EKG changes Hypotension, concern for cardiogenic shock.  H/o HL  P:  Management per cardiology Diuresis as BP tolerates F/u Echo per cards Trending troponin D/c propofol, transition to precedex. Continuing preadmission statin Levophed for BP support  RENAL A:  No acute issues  P:   Follow Bmet Replace electrolytes as indicated Lasix 40 mg IV q12 hours.  GASTROINTESTINAL A:   H/o GERD  P:   SUP: IV protonix  HEMATOLOGIC A:   No acute issues  P:  Follow CBC VTE ppx: Heparin SQ  INFECTIOUS A:   SIRS  P:   Monitor off ABX  ENDOCRINE A:   DM 2   P:   Holding metformin CBG monitoring, SSI  NEUROLOGIC A:   Acute encephalopathy in setting of hypercarbia P:   RASS goal: -1 Precedex for sedation in setting of hypotension.  Fentanyl PRN for analgesia Daily WUA  Diuresed well, oxygenation improved, decrease PEEP to 5 and if tolerated begin PS trials, continue diureses for now and anticipate will be able to extubate in AM.  I have personally obtained a history, examined the patient, evaluated laboratory and imaging results, formulated the assessment and plan and placed orders.  CRITICAL CARE: The patient is critically ill with multiple  organ systems failure and requires high complexity decision making for assessment and support, frequent evaluation and titration of therapies, application of advanced monitoring technologies and extensive interpretation of multiple databases. Critical Care Time devoted to patient care services described in this note is 45 minutes.   Rush Farmer, M.D. [facility] Pulmonary/Critical Care Medicine. Pager: [phone]. After hours pager: [phone].

## 2014-02-05 NOTE — Progress Notes (Signed)
Subjective:  Intubated, alert and communicative  Objective:  Temp:  [97.3 F (36.3 C)-99.9 F (37.7 C)] 97.7 F (36.5 C) (09/17 0800) Pulse Rate:  [43-88] 53 (09/17 0827) Resp:  [17-29] 27 (09/17 0827) BP: (93-184)/(51-111) 149/93 mmHg (09/17 0800) SpO2:  [91 %-98 %] 91 % (09/17 0827) FiO2 (%):  [40 %-60 %] 40 % (09/17 0827) Weight:  [201 lb 4.5 oz (91.3 kg)] 201 lb 4.5 oz (91.3 kg) (09/17 0400) Weight change: -8 lb 2.5 oz (-3.7 kg)  Intake/Output from previous day: 09/16 0701 - 09/17 0700 In: 1154.9 [I.V.:331.9; NG/GT:823] Out: 2775 [Urine:2575; Emesis/NG output:200]  Intake/Output from this shift: Total I/O In: 81.3 [I.V.:21.3; NG/GT:60] Out: -   Physical Exam: General appearance: alert, cooperative and no distress Neck: no adenopathy, no carotid bruit, no JVD, supple, symmetrical, trachea midline and thyroid not enlarged, symmetric, no tenderness/mass/nodules Lungs: clear to auscultation bilaterally Heart: regular rate and rhythm, S1, S2 normal, no murmur, click, rub or gallop Extremities: extremities normal, atraumatic, no cyanosis or edema  Lab Results: Results for orders placed during the hospital encounter of 02/03/14 (from the past 48 hour(s))  PRO B NATRIURETIC PEPTIDE     Status: Abnormal   Collection Time    02/03/14  7:37 PM      Result Value Ref Range   Pro B Natriuretic peptide (BNP) 2600.0 (*) 0 - 125 pg/mL  BASIC METABOLIC PANEL     Status: Abnormal   Collection Time    02/03/14  7:37 PM      Result Value Ref Range   Sodium 133 (*) 137 - 147 mEq/L   Potassium 4.5  3.7 - 5.3 mEq/L   Chloride 93 (*) 96 - 112 mEq/L   CO2 29  19 - 32 mEq/L   Glucose, Bld 99  70 - 99 mg/dL   BUN 8  6 - 23 mg/dL   Creatinine, Ser 0.56  0.50 - 1.10 mg/dL   Calcium 9.4  8.4 - 10.5 mg/dL   GFR calc non Af Amer >90  >90 mL/min   GFR calc Af Amer >90  >90 mL/min   Comment: (NOTE)     The eGFR has been calculated using the CKD EPI equation.     This calculation has  not been validated in all clinical situations.     eGFR's persistently <90 mL/min signify possible Chronic Kidney     Disease.   Anion gap 11  5 - 15  CBC     Status: Abnormal   Collection Time    02/03/14  7:37 PM      Result Value Ref Range   WBC 10.5  4.0 - 10.5 K/uL   RBC 5.22 (*) 3.87 - 5.11 MIL/uL   Hemoglobin 17.0 (*) 12.0 - 15.0 g/dL   HCT 49.8 (*) 36.0 - 46.0 %   MCV 95.4  78.0 - 100.0 fL   MCH 32.6  26.0 - 34.0 pg   MCHC 34.1  30.0 - 36.0 g/dL   RDW 13.0  11.5 - 15.5 %   Platelets 198  150 - 400 K/uL  TROPONIN I     Status: None   Collection Time    02/03/14  7:37 PM      Result Value Ref Range   Troponin I <0.30  <0.30 ng/mL   Comment:            Due to the release kinetics of cTnI,     a negative result within the  first hours     of the onset of symptoms does not rule out     myocardial infarction with certainty.     If myocardial infarction is still suspected,     repeat the test at appropriate intervals.  I-STAT ARTERIAL BLOOD GAS, ED     Status: Abnormal   Collection Time    02/03/14 10:54 PM      Result Value Ref Range   pH, Arterial 7.256 (*) 7.350 - 7.450   pCO2 arterial 71.3 (*) 35.0 - 45.0 mmHg   pO2, Arterial 79.0 (*) 80.0 - 100.0 mmHg   Bicarbonate 31.7 (*) 20.0 - 24.0 mEq/L   TCO2 34  0 - 100 mmol/L   O2 Saturation 93.0     Acid-Base Excess 1.0  0.0 - 2.0 mmol/L   Patient temperature 98.7 F     Collection site RADIAL, ALLEN'S TEST ACCEPTABLE     Drawn by Operator     Sample type ARTERIAL     Comment NOTIFIED PHYSICIAN    I-STAT ARTERIAL BLOOD GAS, ED     Status: Abnormal   Collection Time    02/04/14 12:03 AM      Result Value Ref Range   pH, Arterial 7.225 (*) 7.350 - 7.450   pCO2 arterial 83.7 (*) 35.0 - 45.0 mmHg   pO2, Arterial 155.0 (*) 80.0 - 100.0 mmHg   Bicarbonate 34.7 (*) 20.0 - 24.0 mEq/L   TCO2 37  0 - 100 mmol/L   O2 Saturation 99.0     Acid-Base Excess 3.0 (*) 0.0 - 2.0 mmol/L   Patient temperature 98.6 F     Collection  site RADIAL, ALLEN'S TEST ACCEPTABLE     Drawn by Operator     Sample type ARTERIAL     Comment NOTIFIED PHYSICIAN    I-STAT ARTERIAL BLOOD GAS, ED     Status: Abnormal   Collection Time    02/04/14  1:57 AM      Result Value Ref Range   pH, Arterial 7.290 (*) 7.350 - 7.450   pCO2 arterial 68.9 (*) 35.0 - 45.0 mmHg   pO2, Arterial 230.0 (*) 80.0 - 100.0 mmHg   Bicarbonate 33.1 (*) 20.0 - 24.0 mEq/L   TCO2 35  0 - 100 mmol/L   O2 Saturation 100.0     Acid-Base Excess 3.0 (*) 0.0 - 2.0 mmol/L   Patient temperature 98.6 F     Collection site RADIAL, ALLEN'S TEST ACCEPTABLE     Drawn by Operator     Sample type ARTERIAL     Comment NOTIFIED PHYSICIAN    MRSA PCR SCREENING     Status: None   Collection Time    02/04/14  2:23 AM      Result Value Ref Range   MRSA by PCR NEGATIVE  NEGATIVE   Comment:            The GeneXpert MRSA Assay (FDA     approved for NASAL specimens     only), is one component of a     comprehensive MRSA colonization     surveillance program. It is not     intended to diagnose MRSA     infection nor to guide or     monitor treatment for     MRSA infections.  BASIC METABOLIC PANEL     Status: Abnormal   Collection Time    02/04/14  3:35 AM      Result Value Ref Range  Sodium 134 (*) 137 - 147 mEq/L   Potassium 4.0  3.7 - 5.3 mEq/L   Chloride 92 (*) 96 - 112 mEq/L   CO2 29  19 - 32 mEq/L   Glucose, Bld 215 (*) 70 - 99 mg/dL   BUN 12  6 - 23 mg/dL   Creatinine, Ser 0.84  0.50 - 1.10 mg/dL   Calcium 8.8  8.4 - 10.5 mg/dL   GFR calc non Af Amer 75 (*) >90 mL/min   GFR calc Af Amer 87 (*) >90 mL/min   Comment: (NOTE)     The eGFR has been calculated using the CKD EPI equation.     This calculation has not been validated in all clinical situations.     eGFR's persistently <90 mL/min signify possible Chronic Kidney     Disease.   Anion gap 13  5 - 15  TROPONIN I     Status: None   Collection Time    02/04/14  3:35 AM      Result Value Ref Range    Troponin I <0.30  <0.30 ng/mL   Comment:            Due to the release kinetics of cTnI,     a negative result within the first hours     of the onset of symptoms does not rule out     myocardial infarction with certainty.     If myocardial infarction is still suspected,     repeat the test at appropriate intervals.  TSH     Status: None   Collection Time    02/04/14  3:35 AM      Result Value Ref Range   TSH 0.673  0.350 - 4.500 uIU/mL  CBC WITH DIFFERENTIAL     Status: Abnormal   Collection Time    02/04/14  3:35 AM      Result Value Ref Range   WBC 7.0  4.0 - 10.5 K/uL   RBC 5.03  3.87 - 5.11 MIL/uL   Hemoglobin 16.2 (*) 12.0 - 15.0 g/dL   HCT 49.2 (*) 36.0 - 46.0 %   MCV 97.8  78.0 - 100.0 fL   MCH 32.2  26.0 - 34.0 pg   MCHC 32.9  30.0 - 36.0 g/dL   RDW 13.4  11.5 - 15.5 %   Platelets 187  150 - 400 K/uL   Neutrophils Relative % 95 (*) 43 - 77 %   Neutro Abs 6.6  1.7 - 7.7 K/uL   Lymphocytes Relative 4 (*) 12 - 46 %   Lymphs Abs 0.3 (*) 0.7 - 4.0 K/uL   Monocytes Relative 1 (*) 3 - 12 %   Monocytes Absolute 0.1  0.1 - 1.0 K/uL   Eosinophils Relative 0  0 - 5 %   Eosinophils Absolute 0.0  0.0 - 0.7 K/uL   Basophils Relative 0  0 - 1 %   Basophils Absolute 0.0  0.0 - 0.1 K/uL  GLUCOSE, CAPILLARY     Status: Abnormal   Collection Time    02/04/14  4:53 AM      Result Value Ref Range   Glucose-Capillary 202 (*) 70 - 99 mg/dL  BLOOD GAS, ARTERIAL     Status: Abnormal   Collection Time    02/04/14  5:00 AM      Result Value Ref Range   FIO2 0.60     Delivery systems VENTILATOR     Mode PRESSURE REGULATED VOLUME CONTROL  VT 350     Rate 22     Peep/cpap 5.0     pH, Arterial 7.265 (*) 7.350 - 7.450   pCO2 arterial 73.9 (*) 35.0 - 45.0 mmHg   Comment: CRITICAL RESULT CALLED TO, READ BACK BY AND VERIFIED WITH:     STEPHANIE MITCHEM,RN AT 1761, BY ARIELLE FOWLER RRT, RCP ON 02/04/2014   pO2, Arterial 96.9  80.0 - 100.0 mmHg   Bicarbonate 32.4 (*) 20.0 - 24.0 mEq/L    TCO2 34.7  0 - 100 mmol/L   Acid-Base Excess 5.8 (*) 0.0 - 2.0 mmol/L   O2 Saturation 96.6     Patient temperature 98.6     Collection site RIGHT RADIAL     Drawn by [phone]     Sample type ARTERIAL DRAW     Allens test (pass/fail) PASS  PASS  GLUCOSE, CAPILLARY     Status: Abnormal   Collection Time    02/04/14  8:25 AM      Result Value Ref Range   Glucose-Capillary 159 (*) 70 - 99 mg/dL  TROPONIN I     Status: None   Collection Time    02/04/14  8:50 AM      Result Value Ref Range   Troponin I <0.30  <0.30 ng/mL   Comment:            Due to the release kinetics of cTnI,     a negative result within the first hours     of the onset of symptoms does not rule out     myocardial infarction with certainty.     If myocardial infarction is still suspected,     repeat the test at appropriate intervals.  POCT I-STAT 3, ART BLOOD GAS (G3+)     Status: Abnormal   Collection Time    02/04/14  9:50 AM      Result Value Ref Range   pH, Arterial 7.387  7.350 - 7.450   pCO2 arterial 62.7 (*) 35.0 - 45.0 mmHg   pO2, Arterial 87.0  80.0 - 100.0 mmHg   Bicarbonate 37.6 (*) 20.0 - 24.0 mEq/L   TCO2 39  0 - 100 mmol/L   O2 Saturation 96.0     Acid-Base Excess 9.0 (*) 0.0 - 2.0 mmol/L   Patient temperature 37.4 C     Collection site RADIAL, ALLEN'S TEST ACCEPTABLE     Drawn by Operator     Sample type ARTERIAL     Comment NOTIFIED PHYSICIAN    GLUCOSE, CAPILLARY     Status: Abnormal   Collection Time    02/04/14 12:18 PM      Result Value Ref Range   Glucose-Capillary 159 (*) 70 - 99 mg/dL  TROPONIN I     Status: None   Collection Time    02/04/14  3:00 PM      Result Value Ref Range   Troponin I <0.30  <0.30 ng/mL   Comment:            Due to the release kinetics of cTnI,     a negative result within the first hours     of the onset of symptoms does not rule out     myocardial infarction with certainty.     If myocardial infarction is still suspected,     repeat the test at  appropriate intervals.  GLUCOSE, CAPILLARY     Status: Abnormal   Collection Time    02/04/14  4:22 PM  Result Value Ref Range   Glucose-Capillary 124 (*) 70 - 99 mg/dL   Comment 1 Capillary Sample    GLUCOSE, CAPILLARY     Status: Abnormal   Collection Time    02/04/14  8:00 PM      Result Value Ref Range   Glucose-Capillary 179 (*) 70 - 99 mg/dL   Comment 1 Venous Sample     Comment 2 Capillary Sample    GLUCOSE, CAPILLARY     Status: Abnormal   Collection Time    02/05/14 12:04 AM      Result Value Ref Range   Glucose-Capillary 137 (*) 70 - 99 mg/dL   Comment 1 Arterial Sample    BLOOD GAS, ARTERIAL     Status: Abnormal   Collection Time    02/05/14  4:27 AM      Result Value Ref Range   FIO2 0.50     Delivery systems VENTILATOR     Mode PRESSURE REGULATED VOLUME CONTROL     VT 360     Rate 26     Peep/cpap 10.0     pH, Arterial 7.438  7.350 - 7.450   pCO2 arterial 53.8 (*) 35.0 - 45.0 mmHg   pO2, Arterial 92.4  80.0 - 100.0 mmHg   Bicarbonate 35.8 (*) 20.0 - 24.0 mEq/L   TCO2 37.4  0 - 100 mmol/L   Acid-Base Excess 11.0 (*) 0.0 - 2.0 mmol/L   O2 Saturation 97.7     Patient temperature 98.6     Collection site RIGHT RADIAL     Drawn by [phone]     Sample type ARTERIAL DRAW     Allens test (pass/fail) PASS  PASS  GLUCOSE, CAPILLARY     Status: Abnormal   Collection Time    02/05/14  4:33 AM      Result Value Ref Range   Glucose-Capillary 138 (*) 70 - 99 mg/dL  BASIC METABOLIC PANEL     Status: Abnormal   Collection Time    02/05/14  5:00 AM      Result Value Ref Range   Sodium 137  137 - 147 mEq/L   Potassium 4.2  3.7 - 5.3 mEq/L   Chloride 92 (*) 96 - 112 mEq/L   CO2 33 (*) 19 - 32 mEq/L   Glucose, Bld 124 (*) 70 - 99 mg/dL   BUN 24 (*) 6 - 23 mg/dL   Comment: DELTA CHECK NOTED   Creatinine, Ser 0.65  0.50 - 1.10 mg/dL   Calcium 9.9  8.4 - 10.5 mg/dL   GFR calc non Af Amer >90  >90 mL/min   GFR calc Af Amer >90  >90 mL/min   Comment: (NOTE)     The  eGFR has been calculated using the CKD EPI equation.     This calculation has not been validated in all clinical situations.     eGFR's persistently <90 mL/min signify possible Chronic Kidney     Disease.   Anion gap 12  5 - 15  CBC     Status: Abnormal   Collection Time    02/05/14  5:00 AM      Result Value Ref Range   WBC 22.2 (*) 4.0 - 10.5 K/uL   RBC 5.34 (*) 3.87 - 5.11 MIL/uL   Hemoglobin 17.4 (*) 12.0 - 15.0 g/dL   HCT 51.4 (*) 36.0 - 46.0 %   MCV 96.3  78.0 - 100.0 fL   MCH 32.6  26.0 -  34.0 pg   MCHC 33.9  30.0 - 36.0 g/dL   RDW 13.3  11.5 - 15.5 %   Platelets 199  150 - 400 K/uL  MAGNESIUM     Status: None   Collection Time    02/05/14  5:00 AM      Result Value Ref Range   Magnesium 2.0  1.5 - 2.5 mg/dL  PHOSPHORUS     Status: None   Collection Time    02/05/14  5:00 AM      Result Value Ref Range   Phosphorus 3.1  2.3 - 4.6 mg/dL  GLUCOSE, CAPILLARY     Status: Abnormal   Collection Time    02/05/14  8:20 AM      Result Value Ref Range   Glucose-Capillary 148 (*) 70 - 99 mg/dL   Comment 1 Capillary Sample      Imaging: Imaging results have been reviewed  Assessment/Plan:   1. Active Problems: 2.   Respiratory failure 3.   Hypotension, unspecified 4.   Altered mental status 5.   Acute respiratory failure 6.   Time Spent Directly with Patient:  20 minutes  Length of Stay:  LOS: 2 days   Day # 2 VDRF. [name] Pt c/o CP for says leading up to her decompensation. EF nl by 2D (no RWMA). Enz neg. EKG with inferior and AL TWI c/w ischemia. Exam benign. Labs OK. CXR improving. Prob extubation per PCCM. Will need cath prior to D/C, probably early next week. On Lovenox for DVT prophylaxis.  Lorretta Harp 02/05/2014, 10:29 AM

## 2014-02-05 NOTE — Clinical Documentation Improvement (Addendum)
CHF documented in H&P; "Acute on likely chronic CHF" with lasix 40 IV BID documented in critical care consult note 02/04/14.  Please clarify CHF  acuity and type in cardiology progress note and discharge summary.    Thank you, Mateo Flow, RN [phone] Clinical Documentation Specialist

## 2014-02-06 ENCOUNTER — Inpatient Hospital Stay (HOSPITAL_COMMUNITY): Payer: Medicare Other

## 2014-02-06 DIAGNOSIS — I5031 Acute diastolic (congestive) heart failure: Secondary | ICD-10-CM | POA: Diagnosis present

## 2014-02-06 DIAGNOSIS — E785 Hyperlipidemia, unspecified: Secondary | ICD-10-CM

## 2014-02-06 DIAGNOSIS — J90 Pleural effusion, not elsewhere classified: Secondary | ICD-10-CM

## 2014-02-06 LAB — BLOOD GAS, ARTERIAL
Acid-Base Excess: 11.7 mmol/L — ABNORMAL HIGH (ref 0.0–2.0)
BICARBONATE: 36.1 meq/L — AB (ref 20.0–24.0)
FIO2: 0.4 %
O2 SAT: 93.5 %
PATIENT TEMPERATURE: 98.6
PEEP: 5 cmH2O
RATE: 26 resp/min
TCO2: 37.6 mmol/L (ref 0–100)
VT: 360 mL
pCO2 arterial: 49.5 mmHg — ABNORMAL HIGH (ref 35.0–45.0)
pH, Arterial: 7.476 — ABNORMAL HIGH (ref 7.350–7.450)
pO2, Arterial: 68.6 mmHg — ABNORMAL LOW (ref 80.0–100.0)

## 2014-02-06 LAB — CBC
HEMATOCRIT: 55.3 % — AB (ref 36.0–46.0)
HEMOGLOBIN: 18.6 g/dL — AB (ref 12.0–15.0)
MCH: 32.3 pg (ref 26.0–34.0)
MCHC: 33.6 g/dL (ref 30.0–36.0)
MCV: 96.2 fL (ref 78.0–100.0)
Platelets: 210 10*3/uL (ref 150–400)
RBC: 5.75 MIL/uL — ABNORMAL HIGH (ref 3.87–5.11)
RDW: 13.5 % (ref 11.5–15.5)
WBC: 18.3 10*3/uL — AB (ref 4.0–10.5)

## 2014-02-06 LAB — GLUCOSE, CAPILLARY
GLUCOSE-CAPILLARY: 95 mg/dL (ref 70–99)
Glucose-Capillary: 107 mg/dL — ABNORMAL HIGH (ref 70–99)
Glucose-Capillary: 120 mg/dL — ABNORMAL HIGH (ref 70–99)
Glucose-Capillary: 123 mg/dL — ABNORMAL HIGH (ref 70–99)
Glucose-Capillary: 162 mg/dL — ABNORMAL HIGH (ref 70–99)
Glucose-Capillary: 205 mg/dL — ABNORMAL HIGH (ref 70–99)

## 2014-02-06 LAB — BASIC METABOLIC PANEL
Anion gap: 10 (ref 5–15)
BUN: 36 mg/dL — AB (ref 6–23)
CO2: 38 meq/L — AB (ref 19–32)
Calcium: 10.2 mg/dL (ref 8.4–10.5)
Chloride: 92 mEq/L — ABNORMAL LOW (ref 96–112)
Creatinine, Ser: 0.62 mg/dL (ref 0.50–1.10)
GFR calc Af Amer: >90 mL/min (ref >90)
GFR calc non Af Amer: >90 mL/min (ref >90)
Glucose, Bld: 99 mg/dL (ref 70–99)
POTASSIUM: 3.7 meq/L (ref 3.7–5.3)
SODIUM: 140 meq/L (ref 137–147)

## 2014-02-06 LAB — MAGNESIUM: Magnesium: 1.9 mg/dL (ref 1.5–2.5)

## 2014-02-06 LAB — PHOSPHORUS: Phosphorus: 3.9 mg/dL (ref 2.3–4.6)

## 2014-02-06 MED ORDER — MAGNESIUM SULFATE 40 MG/ML IJ SOLN
2.0000 g | Freq: Once | INTRAMUSCULAR | Status: AC
  Administered 2014-02-06: 2 g via INTRAVENOUS
  Filled 2014-02-06: qty 50

## 2014-02-06 MED ORDER — POTASSIUM CHLORIDE CRYS ER 20 MEQ PO TBCR
40.0000 meq | EXTENDED_RELEASE_TABLET | Freq: Three times a day (TID) | ORAL | Status: AC
  Administered 2014-02-06 (×2): 40 meq via ORAL
  Filled 2014-02-06 (×2): qty 2

## 2014-02-06 MED ORDER — METHYLPREDNISOLONE SODIUM SUCC 40 MG IJ SOLR
30.0000 mg | Freq: Two times a day (BID) | INTRAMUSCULAR | Status: DC
  Administered 2014-02-06 – 2014-02-07 (×2): 30 mg via INTRAVENOUS
  Filled 2014-02-06 (×5): qty 0.75

## 2014-02-06 MED ORDER — CETYLPYRIDINIUM CHLORIDE 0.05 % MT LIQD
7.0000 mL | Freq: Two times a day (BID) | OROMUCOSAL | Status: DC
  Administered 2014-02-06 – 2014-02-10 (×9): 7 mL via OROMUCOSAL

## 2014-02-06 NOTE — Progress Notes (Signed)
Dr. Nelda Marseille notified of increased oxygen requirements post extubation.  Pt is currently on venti mask at 55% fi02.  Orders received, will continue to monitor pt closely.

## 2014-02-06 NOTE — Procedures (Signed)
Extubation Procedure Note  Patient Details:   Name: Kim [name] DOB: May 17, 1956 MRN: [id number]   Airway Documentation:   pt suctioned prior to extubation, placed on 6lpm nasal cannula, incentive spirometer achieved 550'cc x 10 followed by a nonproductive cough.  Evaluation  O2 sats: stable throughout Complications: No apparent complications Patient did tolerate procedure well. Bilateral Breath Sounds: Diminished Suctioning: Airway Yes  [name] 02/06/2014, 10:23 AM

## 2014-02-06 NOTE — Progress Notes (Addendum)
Subjective:  VDRF. Alert, intubated, VSS. Good sats. For extubation this AM  Objective:  Temp:  [97.7 F (36.5 C)-99.3 F (37.4 C)] 98.2 F (36.8 C) (09/18 0900) Pulse Rate:  [46-89] 62 (09/18 0900) Resp:  [17-29] 23 (09/18 0900) BP: (101-171)/(67-91) 123/77 mmHg (09/18 0900) SpO2:  [89 %-95 %] 90 % (09/18 0900) FiO2 (%):  [40 %-45 %] 45 % (09/18 0852) Weight:  [193 lb 12.6 oz (87.9 kg)] 193 lb 12.6 oz (87.9 kg) (09/18 0400) Weight change: -7 lb 7.9 oz (-3.4 kg)  Intake/Output from previous day: 09/17 0701 - 09/18 0700 In: 1438.7 [I.V.:333.7; NG/GT:1105] Out: 3135 [Urine:3135]  Intake/Output from this shift:    Physical Exam: General appearance: alert and no distress Neck: no adenopathy, no carotid bruit, no JVD, supple, symmetrical, trachea midline and thyroid not enlarged, symmetric, no tenderness/mass/nodules Lungs: clear to auscultation bilaterally Heart: regular rate and rhythm, S1, S2 normal, no murmur, click, rub or gallop Extremities: extremities normal, atraumatic, no cyanosis or edema  Lab Results: Results for orders placed during the hospital encounter of 02/03/14 (from the past 48 hour(s))  GLUCOSE, CAPILLARY     Status: Abnormal   Collection Time    02/04/14 12:18 PM      Result Value Ref Range   Glucose-Capillary 159 (*) 70 - 99 mg/dL  TROPONIN I     Status: None   Collection Time    02/04/14  3:00 PM      Result Value Ref Range   Troponin I <0.30  <0.30 ng/mL   Comment:            Due to the release kinetics of cTnI,     a negative result within the first hours     of the onset of symptoms does not rule out     myocardial infarction with certainty.     If myocardial infarction is still suspected,     repeat the test at appropriate intervals.  GLUCOSE, CAPILLARY     Status: Abnormal   Collection Time    02/04/14  4:22 PM      Result Value Ref Range   Glucose-Capillary 124 (*) 70 - 99 mg/dL   Comment 1 Capillary Sample    GLUCOSE,  CAPILLARY     Status: Abnormal   Collection Time    02/04/14  8:00 PM      Result Value Ref Range   Glucose-Capillary 179 (*) 70 - 99 mg/dL   Comment 1 Venous Sample     Comment 2 Capillary Sample    GLUCOSE, CAPILLARY     Status: Abnormal   Collection Time    02/05/14 12:04 AM      Result Value Ref Range   Glucose-Capillary 137 (*) 70 - 99 mg/dL   Comment 1 Arterial Sample    BLOOD GAS, ARTERIAL     Status: Abnormal   Collection Time    02/05/14  4:27 AM      Result Value Ref Range   FIO2 0.50     Delivery systems VENTILATOR     Mode PRESSURE REGULATED VOLUME CONTROL     VT 360     Rate 26     Peep/cpap 10.0     pH, Arterial 7.438  7.350 - 7.450   pCO2 arterial 53.8 (*) 35.0 - 45.0 mmHg   pO2, Arterial 92.4  80.0 - 100.0 mmHg   Bicarbonate 35.8 (*) 20.0 - 24.0 mEq/L   TCO2 37.4  0 -  100 mmol/L   Acid-Base Excess 11.0 (*) 0.0 - 2.0 mmol/L   O2 Saturation 97.7     Patient temperature 98.6     Collection site RIGHT RADIAL     Drawn by [phone]     Sample type ARTERIAL DRAW     Allens test (pass/fail) PASS  PASS  GLUCOSE, CAPILLARY     Status: Abnormal   Collection Time    02/05/14  4:33 AM      Result Value Ref Range   Glucose-Capillary 138 (*) 70 - 99 mg/dL  BASIC METABOLIC PANEL     Status: Abnormal   Collection Time    02/05/14  5:00 AM      Result Value Ref Range   Sodium 137  137 - 147 mEq/L   Potassium 4.2  3.7 - 5.3 mEq/L   Chloride 92 (*) 96 - 112 mEq/L   CO2 33 (*) 19 - 32 mEq/L   Glucose, Bld 124 (*) 70 - 99 mg/dL   BUN 24 (*) 6 - 23 mg/dL   Comment: DELTA CHECK NOTED   Creatinine, Ser 0.65  0.50 - 1.10 mg/dL   Calcium 9.9  8.4 - 10.5 mg/dL   GFR calc non Af Amer >90  >90 mL/min   GFR calc Af Amer >90  >90 mL/min   Comment: (NOTE)     The eGFR has been calculated using the CKD EPI equation.     This calculation has not been validated in all clinical situations.     eGFR's persistently <90 mL/min signify possible Chronic Kidney     Disease.   Anion gap  12  5 - 15  CBC     Status: Abnormal   Collection Time    02/05/14  5:00 AM      Result Value Ref Range   WBC 22.2 (*) 4.0 - 10.5 K/uL   RBC 5.34 (*) 3.87 - 5.11 MIL/uL   Hemoglobin 17.4 (*) 12.0 - 15.0 g/dL   HCT 51.4 (*) 36.0 - 46.0 %   MCV 96.3  78.0 - 100.0 fL   MCH 32.6  26.0 - 34.0 pg   MCHC 33.9  30.0 - 36.0 g/dL   RDW 13.3  11.5 - 15.5 %   Platelets 199  150 - 400 K/uL  MAGNESIUM     Status: None   Collection Time    02/05/14  5:00 AM      Result Value Ref Range   Magnesium 2.0  1.5 - 2.5 mg/dL  PHOSPHORUS     Status: None   Collection Time    02/05/14  5:00 AM      Result Value Ref Range   Phosphorus 3.1  2.3 - 4.6 mg/dL  GLUCOSE, CAPILLARY     Status: Abnormal   Collection Time    02/05/14  8:20 AM      Result Value Ref Range   Glucose-Capillary 148 (*) 70 - 99 mg/dL   Comment 1 Capillary Sample    GLUCOSE, CAPILLARY     Status: Abnormal   Collection Time    02/05/14 11:52 AM      Result Value Ref Range   Glucose-Capillary 176 (*) 70 - 99 mg/dL   Comment 1 Capillary Sample    GLUCOSE, CAPILLARY     Status: Abnormal   Collection Time    02/05/14  4:09 PM      Result Value Ref Range   Glucose-Capillary 120 (*) 70 - 99 mg/dL   Comment  1 Capillary Sample    GLUCOSE, CAPILLARY     Status: Abnormal   Collection Time    02/05/14  9:06 PM      Result Value Ref Range   Glucose-Capillary 175 (*) 70 - 99 mg/dL   Comment 1 Capillary Sample    GLUCOSE, CAPILLARY     Status: Abnormal   Collection Time    02/06/14 12:56 AM      Result Value Ref Range   Glucose-Capillary 162 (*) 70 - 99 mg/dL   Comment 1 Capillary Sample    GLUCOSE, CAPILLARY     Status: None   Collection Time    02/06/14  4:53 AM      Result Value Ref Range   Glucose-Capillary 95  70 - 99 mg/dL   Comment 1 Capillary Sample    BASIC METABOLIC PANEL     Status: Abnormal   Collection Time    02/06/14  5:00 AM      Result Value Ref Range   Sodium 140  137 - 147 mEq/L   Potassium 3.7  3.7 - 5.3  mEq/L   Chloride 92 (*) 96 - 112 mEq/L   CO2 38 (*) 19 - 32 mEq/L   Glucose, Bld 99  70 - 99 mg/dL   BUN 36 (*) 6 - 23 mg/dL   Creatinine, Ser 0.62  0.50 - 1.10 mg/dL   Calcium 10.2  8.4 - 10.5 mg/dL   GFR calc non Af Amer >90  >90 mL/min   GFR calc Af Amer >90  >90 mL/min   Comment: (NOTE)     The eGFR has been calculated using the CKD EPI equation.     This calculation has not been validated in all clinical situations.     eGFR's persistently <90 mL/min signify possible Chronic Kidney     Disease.   Anion gap 10  5 - 15  CBC     Status: Abnormal   Collection Time    02/06/14  5:00 AM      Result Value Ref Range   WBC 18.3 (*) 4.0 - 10.5 K/uL   RBC 5.75 (*) 3.87 - 5.11 MIL/uL   Hemoglobin 18.6 (*) 12.0 - 15.0 g/dL   HCT 55.3 (*) 36.0 - 46.0 %   MCV 96.2  78.0 - 100.0 fL   MCH 32.3  26.0 - 34.0 pg   MCHC 33.6  30.0 - 36.0 g/dL   RDW 13.5  11.5 - 15.5 %   Platelets 210  150 - 400 K/uL  PHOSPHORUS     Status: None   Collection Time    02/06/14  5:00 AM      Result Value Ref Range   Phosphorus 3.9  2.3 - 4.6 mg/dL  MAGNESIUM     Status: None   Collection Time    02/06/14  5:00 AM      Result Value Ref Range   Magnesium 1.9  1.5 - 2.5 mg/dL  BLOOD GAS, ARTERIAL     Status: Abnormal   Collection Time    02/06/14  5:59 AM      Result Value Ref Range   FIO2 0.40     Delivery systems VENTILATOR     Mode PRESSURE REGULATED VOLUME CONTROL     VT 360     Rate 26     Peep/cpap 5.0     pH, Arterial 7.476 (*) 7.350 - 7.450   pCO2 arterial 49.5 (*) 35.0 - 45.0 mmHg  pO2, Arterial 68.6 (*) 80.0 - 100.0 mmHg   Bicarbonate 36.1 (*) 20.0 - 24.0 mEq/L   TCO2 37.6  0 - 100 mmol/L   Acid-Base Excess 11.7 (*) 0.0 - 2.0 mmol/L   O2 Saturation 93.5     Patient temperature 98.6     Collection site LEFT RADIAL     Drawn by COLLECTED BY RT     Sample type ARTERIAL DRAW     Allens test (pass/fail) PASS  PASS  GLUCOSE, CAPILLARY     Status: Abnormal   Collection Time    02/06/14   7:34 AM      Result Value Ref Range   Glucose-Capillary 205 (*) 70 - 99 mg/dL   Comment 1 Capillary Sample      Imaging: Imaging results have been reviewed  Tele: NSR   Assessment/Plan:   1. Active Problems: 2.   Respiratory failure 3.   Hypotension, unspecified 4.   Altered mental status 5.   Acute respiratory failure 6. Abn EKG/ AL TWI  Time Spent Directly with Patient:  15 minutes  Length of Stay:  LOS: 3 days   For extubation this AM. Has remained stable. Exam benign. Labs OK. Nl LV FXN. Will prob need cath on Monday given EKG abnormalities. On IV diuresis. I/O -5L.  Lorretta Harp 02/06/2014, 10:02 AM

## 2014-02-06 NOTE — Progress Notes (Signed)
PULMONARY / CRITICAL CARE MEDICINE   Name: Kim [name] MRN: [id number] DOB: 03-04-1956    ADMISSION DATE:  02/03/2014 CONSULTATION DATE:  02/03/2014  REFERRING MD :  Dr. Elias Else  CHIEF COMPLAINT:  Dyspnea  INITIAL PRESENTATION: 58 year old female presented to [facility] 9/15 c/o SOB from urgent care where she was requiring 5L O2. Has not taken lasix for 4 days, CXR c/w Edema. Intubated in ED. PCCM asked to see for vent management.   STUDIES:    SIGNIFICANT EVENTS: 9/15 > intubated, to ICU.   SUBJECTIVE: No events overnight, alert, interactive and wants the tube out.  VITAL SIGNS: Temp:  [97.7 F (36.5 C)-99.3 F (37.4 C)] 99 F (37.2 C) (09/18 1000) Pulse Rate:  [46-89] 67 (09/18 1000) Resp:  [17-29] 28 (09/18 1000) BP: (101-171)/(67-91) 109/69 mmHg (09/18 1000) SpO2:  [88 %-95 %] 88 % (09/18 1000) FiO2 (%):  [40 %-45 %] 45 % (09/18 1000) Weight:  [87.9 kg (193 lb 12.6 oz)] 87.9 kg (193 lb 12.6 oz) (09/18 0400)  HEMODYNAMICS: CVP:  [4 mmHg-7 mmHg] 5 mmHg  VENTILATOR SETTINGS: Vent Mode:  [-] PSV FiO2 (%):  [40 %-45 %] 45 % Set Rate:  [26 bmp] 26 bmp Vt Set:  [360 mL] 360 mL PEEP:  [5 cmH20] 5 cmH20 Pressure Support:  [5 cmH20-8 cmH20] 8 cmH20 Plateau Pressure:  [14 cmH20-16 cmH20] 16 cmH20  INTAKE / OUTPUT:  Intake/Output Summary (Last 24 hours) at 02/06/14 1051 Last data filed at 02/06/14 1000  Gross per 24 hour  Intake 1606.4 ml  Output   2710 ml  Net -1103.6 ml   PHYSICAL EXAMINATION: General:  Obese female in NAD on vent.  Neuro:  Alert and interactive, writing notes HEENT:  [place]/AT, no JVD noted Cardiovascular:  RRR, borderline tachy. No MRG noted Lungs:  Crackles/rhonchi throughout. Synchronous with vent. Abdomen:  Obese, soft, non-disteneded Musculoskeletal:  No acute deformity. +2 pitting edema to BLE Skin:  Intact, MMM  LABS:  CBC  Recent Labs Lab 02/04/14 0335 02/05/14 0500 02/06/14 0500  WBC 7.0 22.2* 18.3*  HGB 16.2* 17.4* 18.6*  HCT  49.2* 51.4* 55.3*  PLT 187 199 210   Coag's No results found for this basename: APTT, INR,  in the last 168 hours BMET  Recent Labs Lab 02/04/14 0335 02/05/14 0500 02/06/14 0500  NA 134* 137 140  K 4.0 4.2 3.7  CL 92* 92* 92*  CO2 29 33* 38*  BUN 12 24* 36*  CREATININE 0.84 0.65 0.62  GLUCOSE 215* 124* 99   Electrolytes  Recent Labs Lab 02/04/14 0335 02/05/14 0500 02/06/14 0500  CALCIUM 8.8 9.9 10.2  MG  --  2.0 1.9  PHOS  --  3.1 3.9   Sepsis Markers No results found for this basename: LATICACIDVEN, PROCALCITON, O2SATVEN,  in the last 168 hours ABG  Recent Labs Lab 02/04/14 0950 02/05/14 0427 02/06/14 0559  PHART 7.387 7.438 7.476*  PCO2ART 62.7* 53.8* 49.5*  PO2ART 87.0 92.4 68.6*   Liver Enzymes No results found for this basename: AST, ALT, ALKPHOS, BILITOT, ALBUMIN,  in the last 168 hours Cardiac Enzymes  Recent Labs Lab 02/03/14 1937 02/04/14 0335 02/04/14 0850 02/04/14 1500  TROPONINI <0.30 <0.30 <0.30 <0.30  PROBNP 2600.0*  --   --   --    Glucose  Recent Labs Lab 02/05/14 1152 02/05/14 1609 02/05/14 2106 02/06/14 0056 02/06/14 0453 02/06/14 0734  GLUCAP 176* 120* 175* 162* 95 205*    Imaging Dg Chest Port 1  View  02/05/2014   CLINICAL DATA:  Intubation  EXAM: PORTABLE CHEST - 1 VIEW  COMPARISON:  Portable chest x-ray of 02/04/2014  FINDINGS: The tip of the endotracheal tube is approximately 2.4 cm above the carina. Cardiomegaly and mild pulmonary vascular congestion is noted with some improvement in congestion. Small left effusion has diminished. Right IJ central venous line overlies the mid SVC.  IMPRESSION: 1. Endotracheal tube tip approximately 2.4 cm above the carina. 2. Some improvement in mild pulmonary vascular congestion and small left effusion.   Electronically Signed   By: Ivar Drape M.D.   On: 02/05/2014 07:53     ASSESSMENT / PLAN:  PULMONARY OETT 9/15>>> A: Acute hypercarbic respiratory failure Pulmonary  edema H/o ? COPD without evidence of acute exacerbation  P:   Extubate today Pulmonary Hygeine PRN BD Solumedrol 60 mg IV q12 as ordered, will decrease to 30 mg q12 today and likely d/c in the next 48 hours. Diureses as below  CARDIOVASCULAR A:  Acute on likely chronic CHF Nonspecific EKG changes Hypotension, concern for cardiogenic shock.  H/o HL  P:  Management per cardiology Diuresis as BP tolerates Echo per cards Continuing preadmission statin D/C levophed  RENAL A:   No acute issues Low K and Mg P:   Follow Bmet Replace electrolytes as indicated Lasix 40 mg IV q12 hours.  GASTROINTESTINAL A:   H/o GERD  P:   SUP: IV protonix Begin diet post extubation, if questionable will order a swallow evaluation.  HEMATOLOGIC A:   No acute issues  P:  Follow CBC VTE ppx: Heparin SQ  INFECTIOUS A:   SIRS  P:   Monitor off ABX  ENDOCRINE A:   DM 2   P:   Holding metformin CBG monitoring, SSI  NEUROLOGIC A:   Acute encephalopathy in setting of hypercarbia P:   D/C all sedation Fentanyl PRN for pain  Diuresed well, tolerating PS very well but remains with some hypoxemia (down to 40% FiO2), will extubate, continue diureses, replace electrolytes and d/c pressors and sedation.  I have personally obtained a history, examined the patient, evaluated laboratory and imaging results, formulated the assessment and plan and placed orders.  CRITICAL CARE: The patient is critically ill with multiple organ systems failure and requires high complexity decision making for assessment and support, frequent evaluation and titration of therapies, application of advanced monitoring technologies and extensive interpretation of multiple databases. Critical Care Time devoted to patient care services described in this note is 35 minutes.   Rush Farmer, M.D. [facility] Pulmonary/Critical Care Medicine. Pager: [phone]. After hours pager: [phone].

## 2014-02-07 DIAGNOSIS — J441 Chronic obstructive pulmonary disease with (acute) exacerbation: Secondary | ICD-10-CM

## 2014-02-07 LAB — BASIC METABOLIC PANEL
Anion gap: 11 (ref 5–15)
BUN: 40 mg/dL — ABNORMAL HIGH (ref 6–23)
CALCIUM: 9.6 mg/dL (ref 8.4–10.5)
CO2: 33 meq/L — AB (ref 19–32)
Chloride: 95 mEq/L — ABNORMAL LOW (ref 96–112)
Creatinine, Ser: 0.76 mg/dL (ref 0.50–1.10)
Glucose, Bld: 97 mg/dL (ref 70–99)
Potassium: 4.3 mEq/L (ref 3.7–5.3)
SODIUM: 139 meq/L (ref 137–147)

## 2014-02-07 LAB — GLUCOSE, CAPILLARY
Glucose-Capillary: 138 mg/dL — ABNORMAL HIGH (ref 70–99)
Glucose-Capillary: 87 mg/dL (ref 70–99)
Glucose-Capillary: 105 mg/dL — ABNORMAL HIGH (ref 70–99)
Glucose-Capillary: 112 mg/dL — ABNORMAL HIGH (ref 70–99)
Glucose-Capillary: 128 mg/dL — ABNORMAL HIGH (ref 70–99)
Glucose-Capillary: 93 mg/dL (ref 70–99)

## 2014-02-07 LAB — CBC
HCT: 53.2 % — ABNORMAL HIGH (ref 36.0–46.0)
Hemoglobin: 18.1 g/dL — ABNORMAL HIGH (ref 12.0–15.0)
MCH: 32.8 pg (ref 26.0–34.0)
MCHC: 34 g/dL (ref 30.0–36.0)
MCV: 96.6 fL (ref 78.0–100.0)
PLATELETS: 190 10*3/uL (ref 150–400)
RBC: 5.51 MIL/uL — ABNORMAL HIGH (ref 3.87–5.11)
RDW: 13.5 % (ref 11.5–15.5)
WBC: 16.9 10*3/uL — AB (ref 4.0–10.5)

## 2014-02-07 LAB — PHOSPHORUS: PHOSPHORUS: 3.4 mg/dL (ref 2.3–4.6)

## 2014-02-07 LAB — MAGNESIUM: Magnesium: 2.1 mg/dL (ref 1.5–2.5)

## 2014-02-07 MED ORDER — IPRATROPIUM-ALBUTEROL 0.5-2.5 (3) MG/3ML IN SOLN
3.0000 mL | Freq: Three times a day (TID) | RESPIRATORY_TRACT | Status: DC
  Administered 2014-02-08 – 2014-02-10 (×8): 3 mL via RESPIRATORY_TRACT
  Filled 2014-02-07 (×8): qty 3

## 2014-02-07 MED ORDER — FUROSEMIDE 10 MG/ML IJ SOLN
40.0000 mg | Freq: Every day | INTRAMUSCULAR | Status: DC
  Administered 2014-02-08: 40 mg via INTRAVENOUS
  Filled 2014-02-07: qty 4

## 2014-02-07 MED ORDER — METHYLPREDNISOLONE SODIUM SUCC 40 MG IJ SOLR: 20.0000 mg | INTRAMUSCULAR | Status: DC

## 2014-02-07 MED ORDER — ADULT MULTIVITAMIN W/MINERALS CH
1.0000 | ORAL_TABLET | Freq: Every day | ORAL | Status: DC
  Administered 2014-02-07 – 2014-02-10 (×4): 1 via ORAL
  Filled 2014-02-07 (×4): qty 1

## 2014-02-07 MED ORDER — METHYLPREDNISOLONE SODIUM SUCC 40 MG IJ SOLR
40.0000 mg | INTRAMUSCULAR | Status: DC
  Filled 2014-02-07: qty 1

## 2014-02-07 NOTE — Progress Notes (Signed)
PULMONARY / CRITICAL CARE MEDICINE   Name: Kim [name] MRN: [id number] DOB: 1955-08-04    ADMISSION DATE:  02/03/2014 CONSULTATION DATE:  02/03/2014  REFERRING MD :  Dr. Elias Else  CHIEF COMPLAINT:  Dyspnea  INITIAL PRESENTATION: 58 year old female presented to [facility] 9/15 c/o SOB from urgent care where she was requiring 5L O2. Has not taken lasix for 4 days, CXR c/w edema. Intubated in ED. PCCM asked to see for vent management.   STUDIES:  9/16 ECHO >> nml systolic fxn, EF 25%, Grade I DD  SIGNIFICANT EVENTS: 9/15 > intubated, to ICU.   SUBJECTIVE: Pt up to chair, no distress.  O2 down to 5L   VITAL SIGNS: Temp:  [98.6 F (37 C)-99.9 F (37.7 C)] 98.8 F (37.1 C) (09/19 0700) Pulse Rate:  [63-88] 88 (09/19 0900) Resp:  [18-32] 20 (09/19 0900) BP: (90-132)/(59-85) 116/77 mmHg (09/19 0800) SpO2:  [87 %-94 %] 92 % (09/19 0900) FiO2 (%):  [45 %-55 %] 55 % (09/18 1955) Weight:  [195 lb 12.3 oz (88.8 kg)] 195 lb 12.3 oz (88.8 kg) (09/19 0300)  HEMODYNAMICS: CVP:  [1 mmHg-5 mmHg] 2 mmHg  VENTILATOR SETTINGS: Vent Mode:  [-]  FiO2 (%):  [45 %-55 %] 55 %  INTAKE / OUTPUT:  Intake/Output Summary (Last 24 hours) at 02/07/14 0909 Last data filed at 02/07/14 0851  Gross per 24 hour  Intake 1769.5 ml  Output   2318 ml  Net -548.5 ml   PHYSICAL EXAMINATION: General:  Obese female in NAD, up to chair Neuro:  AAOx4, speech clear, MAE HEENT:  [place]/AT, no JVD noted Cardiovascular:  RRR, no m/r/g Lungs:  resps even/non-labored, lungs bilaterally distant but clear, improved crackles, Smokers cough Abdomen:  Obese, soft, non-disteneded Musculoskeletal:  No acute deformity. +2 pitting edema to BLE Skin:  Intact, MMM  LABS:  CBC  Recent Labs Lab 02/05/14 0500 02/06/14 0500 02/07/14 0431  WBC 22.2* 18.3* 16.9*  HGB 17.4* 18.6* 18.1*  HCT 51.4* 55.3* 53.2*  PLT 199 210 190   Coag's No results found for this basename: APTT, INR,  in the last 168 hours  BMET  Recent  Labs Lab 02/05/14 0500 02/06/14 0500 02/07/14 0431  NA 137 140 139  K 4.2 3.7 4.3  CL 92* 92* 95*  CO2 33* 38* 33*  BUN 24* 36* 40*  CREATININE 0.65 0.62 0.76  GLUCOSE 124* 99 97   Electrolytes  Recent Labs Lab 02/05/14 0500 02/06/14 0500 02/07/14 0431  CALCIUM 9.9 10.2 9.6  MG 2.0 1.9 2.1  PHOS 3.1 3.9 3.4   Sepsis Markers No results found for this basename: LATICACIDVEN, PROCALCITON, O2SATVEN,  in the last 168 hours  ABG  Recent Labs Lab 02/04/14 0950 02/05/14 0427 02/06/14 0559  PHART 7.387 7.438 7.476*  PCO2ART 62.7* 53.8* 49.5*  PO2ART 87.0 92.4 68.6*   Liver Enzymes No results found for this basename: AST, ALT, ALKPHOS, BILITOT, ALBUMIN,  in the last 168 hours  Cardiac Enzymes  Recent Labs Lab 02/03/14 1937 02/04/14 0335 02/04/14 0850 02/04/14 1500  TROPONINI <0.30 <0.30 <0.30 <0.30  PROBNP 2600.0*  --   --   --    Glucose  Recent Labs Lab 02/06/14 1139 02/06/14 1614 02/06/14 2016 02/06/14 2329 02/07/14 0404 02/07/14 0750  GLUCAP 120* 123* 107* 138* 87 105*    Imaging Dg Chest Port 1 View  02/06/2014   CLINICAL DATA:  Evaluate endotracheal tube position  EXAM: PORTABLE CHEST - 1 VIEW  COMPARISON:  Portable  chest x-ray of 02/05/2014  FINDINGS: The tip of the endotracheal tube is approximately 3.9 cm above the carina. Right IJ central venous catheter overlies the mid SVC. The lungs are relatively well aerated. Cardiomegaly is stable.  IMPRESSION: 1. Tip of endotracheal tube 3.9 cm above carina. 2. Cardiomegaly.  Lungs are well aerated.   Electronically Signed   By: Ivar Drape M.D.   On: 02/06/2014 07:53     ASSESSMENT / PLAN:  PULMONARY OETT 9/15>>>9/18 A: Acute hypercarbic respiratory failure Pulmonary edema H/o ? COPD without evidence of acute exacerbation Tobacco Abuse  P:   Pulmonary Hygeine PRN BD Solumedrol 40 Q12 with taper in place to d/c Smoking cessation emphasized  CARDIOVASCULAR A:  Acute on likely chronic  CHF Nonspecific EKG changes Hypotension, concern for cardiogenic shock.  Echo -nml LV fn P:  Management per cardiology Diuresis as BP / Sr Cr tolerates Continuing preadmission statin ASA 81 mg  PRN Hydralazine  RENAL A:   Hypokalemia  Hypomagnesemia P:   Follow Bmet Replace electrolytes as indicated Lasix 40 mg IV q24 hours   GASTROINTESTINAL A:   H/o GERD P:   SUP: IV protonix, likely can d/c in am Heart healthy diet  HEMATOLOGIC A:   No acute issues P:  Follow CBC VTE ppx: Heparin SQ  INFECTIOUS A:   SIRS P:   Monitor off ABX  ENDOCRINE A:   DM 2 P:   Holding metformin CBG monitoring, SSI  NEUROLOGIC A:   Acute encephalopathy in setting of hypercarbia - resolved P:   Resume risperidone   Today's Summary:  Diuresing well.  O2 reduced to 5L, continue mobilization / progression of activity.   Agree that this seems to be more of COPD flare than CHF transfer to tele   I have personally obtained a history, examined the patient, evaluated laboratory and imaging results, formulated the assessment and plan and placed orders.  Rigoberto Noel MD

## 2014-02-07 NOTE — Progress Notes (Signed)
     Subjective:  58 yo admitted with respiratory failure. Found to have TWI in the anterior-lateral leads. Troponin levels are all negative  Still has very tight wheezing.  Echo shows normal LV systolic function.  She does have diastolic dysfunction .   Objective:  Temp:  [98.6 F (37 C)-99.9 F (37.7 C)] 98.8 F (37.1 C) (09/19 0700) Pulse Rate:  [63-88] 88 (09/19 0900) Resp:  [18-32] 20 (09/19 0900) BP: (90-132)/(59-85) 116/77 mmHg (09/19 0800) SpO2:  [87 %-94 %] 93 % (09/19 0954) FiO2 (%):  [55 %] 55 % (09/18 1955) Weight:  [195 lb 12.3 oz (88.8 kg)] 195 lb 12.3 oz (88.8 kg) (09/19 0300) Weight change: 1 lb 15.8 oz (0.9 kg)  Intake/Output from previous day: 09/18 0701 - 09/19 0700 In: 1393.2 [P.O.:1020; I.V.:33.2; NG/GT:290; IV Piggyback:50] Out: 2568 [Urine:2568]  Intake/Output from this shift: Total I/O In: 480 [P.O.:480] Out: 350 [Urine:350]  Physical Exam: BP 116/77  Pulse 88  Temp(Src) 98.8 F (37.1 C) (Core (Comment))  Resp 20  Ht 5' (1.524 m)  Wt 195 lb 12.3 oz (88.8 kg)  BMI 38.23 kg/m2  SpO2 93% HEENT:  Normal Lungs:  Bilateral tight wheezing ant and post. Cor:  RR Abd:  Nontender, + BS Ext:  No edema     Imaging: Imaging results have been reviewed  Tele: NSR   Assessment/Plan:   Active Problems:   Respiratory failure   Hypotension, unspecified   Altered mental status   Acute respiratory failure 1. Abn EKG/ AL TWI  1. Respiratory failure:  She has tight wheezing.  Normal LV function.  She needs aggressive pulmonary treatments.  I do not think this is a cardiac issue. She has marked wheezing and continued CO2 retention.    2. Abn ECG:  Troponins are negative despite severe respiratory failure requiring intubation.  Dr. Gwenlyn Found has suggested cath on Monday but she will need to make significant improvement from a pulmonary standpoint before we do a cath.  I'm not convinced that we need to do a cath at this point.  A Lexiscan myoview  could be done but would need to wait until her lungs are better.  She is on steroids and nebs.   3. Diastolic chf:  She has normal LV systolic function and mild diastolic dysfunction.   She has diuresed > 6 liters over the hospitalization .  I think most of her problems are due to COPD - she smokes 3 ppd.   She is not having any angina.    4. COPD:  She continues to smoke 3 ppd.  She has very tight wheezing this am.   Ramond Dial., MD, [facility] 02/07/2014, 10:20 AM [address]  [place] Pager [phone]

## 2014-02-07 NOTE — Evaluation (Signed)
Physical Therapy Evaluation Patient Details Name: Kim [name] MRN: [id number] DOB: 1956-03-04 Today's Date: 02/07/2014   History of Present Illness  Pt adm 9/15 with respiratory failure and acute on chronic heart failure. Intubated on admit and extubated 9/18. PMH- schizophrenia, DM, COPD. Lives in group home  Clinical Impression  Pt admitted with above. Pt currently with functional limitations due to the deficits listed below (see PT Problem List).  Pt will benefit from skilled PT to increase their independence and safety with mobility to allow discharge to the venue listed below. Expect pt will make rapid progress with mobility.      Follow Up Recommendations No PT follow up    Equipment Recommendations  None recommended by PT    Recommendations for Other Services       Precautions / Restrictions Precautions Precautions: Fall      Mobility  Bed Mobility                  Transfers Overall transfer level: Needs assistance Equipment used: Rolling walker (2 wheeled) Transfers: Sit to/from Stand Sit to Stand: Min guard            Ambulation/Gait Ambulation/Gait assistance: Min guard Ambulation Distance (Feet): 200 Feet Assistive device: Rolling walker (2 wheeled) Gait Pattern/deviations: Step-through pattern;Decreased stride length;Decreased step length - left;Decreased stance time - right   Gait velocity interpretation: at or above normal speed for age/gender General Gait Details: Initially short shuffling steps which lengthened as distance incr.  Stairs            Wheelchair Mobility    Modified Rankin (Stroke Patients Only)       Balance Overall balance assessment: Needs assistance Sitting-balance support: No upper extremity supported;Feet supported Sitting balance-Leahy Scale: Normal     Standing balance support: No upper extremity supported Standing balance-Leahy Scale: Fair                               Pertinent  Vitals/Pain Pain Assessment: No/denies pain    Home Living Family/patient expects to be discharged to:: Group home Living Arrangements:  (staff) Available Help at Discharge:  (staff) Type of Home: House Home Access: Stairs to enter Entrance Stairs-Rails: Right Entrance Stairs-Number of Steps: 3 Home Layout: One level Home Equipment: None      Prior Function Level of Independence: Independent               Hand Dominance        Extremity/Trunk Assessment   Upper Extremity Assessment: Generalized weakness           Lower Extremity Assessment: Generalized weakness         Communication   Communication: No difficulties  Cognition Arousal/Alertness: Awake/alert Behavior During Therapy: WFL for tasks assessed/performed Overall Cognitive Status: No family/caregiver present to determine baseline cognitive functioning                      General Comments      Exercises        Assessment/Plan    PT Assessment Patient needs continued PT services  PT Diagnosis Difficulty walking;Generalized weakness   PT Problem List Decreased strength;Decreased activity tolerance;Decreased balance;Decreased mobility;Decreased knowledge of use of DME;Decreased safety awareness;Decreased knowledge of precautions  PT Treatment Interventions DME instruction;Gait training;Patient/family education;Functional mobility training;Therapeutic activities;Therapeutic exercise;Balance training   PT Goals (Current goals can be found in the Care Plan section) Acute Rehab PT Goals  Patient Stated Goal: Return home PT Goal Formulation: With patient Time For Goal Achievement: 02/14/14 Potential to Achieve Goals: Good    Frequency Min 3X/week   Barriers to discharge        Co-evaluation               End of Session Equipment Utilized During Treatment: Gait belt;Oxygen Activity Tolerance: Patient tolerated treatment well Patient left: in chair;with call bell/phone within  reach Nurse Communication: Mobility status         Time: 5784-6962 PT Time Calculation (min): 15 min   Charges:   PT Evaluation $Initial PT Evaluation Tier I: 1 Procedure PT Treatments $Gait Training: 8-22 mins   PT G Codes:          Kim [name] Mar 04, 2014, 8:25 AM  [facility] PT [phone]

## 2014-02-08 LAB — GLUCOSE, CAPILLARY
GLUCOSE-CAPILLARY: 128 mg/dL — AB (ref 70–99)
GLUCOSE-CAPILLARY: 225 mg/dL — AB (ref 70–99)
Glucose-Capillary: 101 mg/dL — ABNORMAL HIGH (ref 70–99)
Glucose-Capillary: 151 mg/dL — ABNORMAL HIGH (ref 70–99)
Glucose-Capillary: 153 mg/dL — ABNORMAL HIGH (ref 70–99)
Glucose-Capillary: 94 mg/dL (ref 70–99)
Glucose-Capillary: 99 mg/dL (ref 70–99)

## 2014-02-08 LAB — BASIC METABOLIC PANEL
Anion gap: 13 (ref 5–15)
BUN: 31 mg/dL — ABNORMAL HIGH (ref 6–23)
CO2: 32 meq/L (ref 19–32)
Calcium: 9.6 mg/dL (ref 8.4–10.5)
Chloride: 93 mEq/L — ABNORMAL LOW (ref 96–112)
Creatinine, Ser: 0.74 mg/dL (ref 0.50–1.10)
GFR calc Af Amer: >90 mL/min (ref >90)
GFR calc non Af Amer: >90 mL/min (ref >90)
Glucose, Bld: 101 mg/dL — ABNORMAL HIGH (ref 70–99)
Potassium: 4.1 mEq/L (ref 3.7–5.3)
SODIUM: 138 meq/L (ref 137–147)

## 2014-02-08 LAB — CBC
HEMATOCRIT: 54.1 % — AB (ref 36.0–46.0)
HEMOGLOBIN: 18 g/dL — AB (ref 12.0–15.0)
MCH: 32.5 pg (ref 26.0–34.0)
MCHC: 33.3 g/dL (ref 30.0–36.0)
MCV: 97.8 fL (ref 78.0–100.0)
Platelets: 213 10*3/uL (ref 150–400)
RBC: 5.53 MIL/uL — ABNORMAL HIGH (ref 3.87–5.11)
RDW: 13.5 % (ref 11.5–15.5)
WBC: 14.9 10*3/uL — ABNORMAL HIGH (ref 4.0–10.5)

## 2014-02-08 MED ORDER — FUROSEMIDE 40 MG PO TABS
40.0000 mg | ORAL_TABLET | Freq: Every day | ORAL | Status: DC
  Administered 2014-02-08 – 2014-02-09 (×2): 40 mg via ORAL
  Filled 2014-02-08 (×3): qty 1

## 2014-02-08 MED ORDER — PREDNISONE 20 MG PO TABS
40.0000 mg | ORAL_TABLET | Freq: Every day | ORAL | Status: DC
  Administered 2014-02-08 – 2014-02-09 (×2): 40 mg via ORAL
  Filled 2014-02-08 (×4): qty 2

## 2014-02-08 NOTE — Clinical Social Work Psychosocial (Signed)
Clinical Social Work Department BRIEF PSYCHOSOCIAL ASSESSMENT 02/08/2014  Patient:  Kim [name], Kim [name]     Account Number:  [id number]     Admit date:  02/03/2014  Clinical Social Worker:  Hubert Azure  Date/Time:  02/08/2014 06:14 PM  Referred by:  Physician  Date Referred:  02/08/2014 Referred for  Other - See comment   Other Referral:   From [place] type:  Other - See comment Other interview type:   CSW met with patient as well as spoke with Ander Purpura of [facility]    PSYCHOSOCIAL DATA Living Status:  FACILITY Admitted from facility:  Other Level of care:  Assisted Living Primary support name:  Lorayne Marek Primary support relationship to patient:  FRIEND Degree of support available:   Fair    CURRENT CONCERNS Current Concerns  Post-Acute Placement   Other Concerns:    SOCIAL WORK ASSESSMENT / PLAN CSW met with patient who was alert and oriented. CSW introduced self and explained role. CSW discussed d/c plan with patient. Per patient, she has been a resident of [facility] since 2008. Patient reports she has been going to [place] for 3 years and plans to return to [facility].    CSW contacted [facility] and spoke with patient's caregiver Ander Purpura). Per Ander Purpura, patient has been a resident of [facility] since 2009 and can return to [facility] once discharged.   Assessment/plan status:  Psychosocial Support/Ongoing Assessment of Needs Other assessment/ plan:   Information/referral to community resources:    PATIENT'S/FAMILY'S RESPONSE TO PLAN OF CARE: Patient was cooperative and looking forward to returning to ALF.    [place], [place] Weekend Clinical Social Worker [phone]

## 2014-02-08 NOTE — Progress Notes (Signed)
PULMONARY / CRITICAL CARE MEDICINE   Name: Kim [name] MRN: [id number] DOB: Aug 07, 1955    ADMISSION DATE:  02/03/2014 CONSULTATION DATE:  02/03/2014  REFERRING MD :  Dr. Elias Else  CHIEF COMPLAINT:  Dyspnea  INITIAL PRESENTATION: 58 year old schizophrenic, heavy smoker presented to [facility] 9/15 c/o SOB from urgent care where she was requiring 5L O2. Has not taken lasix for 4 days, CXR c/w edema. Intubated in ED. PCCM asked to see for vent management.   STUDIES:  9/16 ECHO >> nml systolic fxn, EF 40%, Grade I DD  SIGNIFICANT EVENTS: 9/15 > intubated, to ICU.   SUBJECTIVE: Pt up having lunch , no distress.  O2 down to 3L   VITAL SIGNS: Temp:  [97.8 F (36.6 C)-98.9 F (37.2 C)] 98.7 F (37.1 C) (09/20 0500) Pulse Rate:  [75-96] 96 (09/20 0500) Resp:  [19-24] 19 (09/20 0500) BP: (105-125)/(65-81) 105/73 mmHg (09/20 0500) SpO2:  [91 %-97 %] 94 % (09/20 1353) Weight:  [90.175 kg (198 lb 12.8 oz)-91 kg (200 lb 9.9 oz)] 90.175 kg (198 lb 12.8 oz) (09/20 0500)  HEMODYNAMICS:    VENTILATOR SETTINGS:    INTAKE / OUTPUT:  Intake/Output Summary (Last 24 hours) at 02/08/14 1356 Last data filed at 02/08/14 1103  Gross per 24 hour  Intake    481 ml  Output   1350 ml  Net   -869 ml   PHYSICAL EXAMINATION: General:  Obese female in NAD Neuro:  AAOx4, speech clear, MAE HEENT:  [place]/AT, no JVD noted Cardiovascular:  RRR, no m/r/g Lungs:  resps even/non-labored, lungs bilaterally decreased  Abdomen:  Obese, soft, non-disteneded Musculoskeletal:  No acute deformity. +2 pitting edema to BLE Skin:  Intact, MMM  LABS:  CBC  Recent Labs Lab 02/06/14 0500 02/07/14 0431 02/08/14 0543  WBC 18.3* 16.9* 14.9*  HGB 18.6* 18.1* 18.0*  HCT 55.3* 53.2* 54.1*  PLT 210 190 213   Coag's No results found for this basename: APTT, INR,  in the last 168 hours  BMET  Recent Labs Lab 02/06/14 0500 02/07/14 0431 02/08/14 0543  NA 140 139 138  K 3.7 4.3 4.1  CL 92* 95* 93*  CO2 38*  33* 32  BUN 36* 40* 31*  CREATININE 0.62 0.76 0.74  GLUCOSE 99 97 101*   Electrolytes  Recent Labs Lab 02/05/14 0500 02/06/14 0500 02/07/14 0431 02/08/14 0543  CALCIUM 9.9 10.2 9.6 9.6  MG 2.0 1.9 2.1  --   PHOS 3.1 3.9 3.4  --    Sepsis Markers No results found for this basename: LATICACIDVEN, PROCALCITON, O2SATVEN,  in the last 168 hours  ABG  Recent Labs Lab 02/04/14 0950 02/05/14 0427 02/06/14 0559  PHART 7.387 7.438 7.476*  PCO2ART 62.7* 53.8* 49.5*  PO2ART 87.0 92.4 68.6*   Liver Enzymes No results found for this basename: AST, ALT, ALKPHOS, BILITOT, ALBUMIN,  in the last 168 hours  Cardiac Enzymes  Recent Labs Lab 02/03/14 1937 02/04/14 0335 02/04/14 0850 02/04/14 1500  TROPONINI <0.30 <0.30 <0.30 <0.30  PROBNP 2600.0*  --   --   --    Glucose  Recent Labs Lab 02/07/14 1155 02/07/14 1643 02/07/14 2112 02/08/14 0059 02/08/14 0510 02/08/14 1150  GLUCAP 112* 128* 93 128* 101* 94    Imaging No results found.   ASSESSMENT / PLAN:  PULMONARY OETT 9/15>>>9/18 A: Acute hypercarbic respiratory failure Pulmonary edema H/o ? COPD without evidence of acute exacerbation Tobacco Abuse  P:   Pulmonary Hygeine PRN BD Dc Solumedrol ,  pred 40 - taper over 2 wks Smoking cessation emphasized  CARDIOVASCULAR A:  Acute on likely chronic CHF Nonspecific EKG changes Hypotension, concern for cardiogenic shock.  Echo -nml LV fn P:  Management per cardiology Diuresis as BP / Sr Cr tolerates Continuing preadmission statin ASA 81 mg  PRN Hydralazine  RENAL A:   Hypokalemia  Hypomagnesemia P:   Follow Bmet Replace electrolytes as indicated Lasix 40 po daily  GASTROINTESTINAL A:   H/o GERD P:   Heart healthy diet  HEMATOLOGIC A:   No acute issues P:  Follow CBC VTE ppx: Heparin SQ  INFECTIOUS A:   SIRS P:   Monitor off ABX  ENDOCRINE A:   DM 2 P:   Holding metformin CBG monitoring, SSI  NEUROLOGIC A:   Acute  encephalopathy in setting of hypercarbia - resolved P:   Resume risperidone   Today's Summary:  Diuresing well.  O2 reduced to 4L, continue mobilization / progression of activity.   Agree that this seems to be more of COPD flare than CHF Hope to discharge soon   I have personally obtained a history, examined the patient, evaluated laboratory and imaging results, formulated the assessment and plan and placed orders.  Rigoberto Noel MD

## 2014-02-08 NOTE — Progress Notes (Signed)
     Subjective:  58 yo admitted with respiratory failure. Found to have TWI in the anterior-lateral leads. Troponin levels are all negative  Still has very tight wheezing.  Echo shows normal LV systolic function.  She does have diastolic dysfunction . She has TWI in the ant leads.   She was told by Dr. Gwenlyn Found that she would need a cath  Objective:  Temp:  [97.8 F (36.6 C)-98.9 F (37.2 C)] 98.7 F (37.1 C) (09/20 0500) Pulse Rate:  [73-96] 96 (09/20 0500) Resp:  [19-24] 19 (09/20 0500) BP: (105-125)/(65-81) 105/73 mmHg (09/20 0500) SpO2:  [91 %-97 %] 92 % (09/20 0736) Weight:  [198 lb 12.8 oz (90.175 kg)-200 lb 9.9 oz (91 kg)] 198 lb 12.8 oz (90.175 kg) (09/20 0500) Weight change: 4 lb 13.6 oz (2.2 kg)  Intake/Output from previous day: 09/19 0701 - 09/20 0700 In: 1078 [P.O.:1078] Out: 2150 [Urine:2150]  Intake/Output from this shift: Total I/O In: 123 [P.O.:120; I.V.:3] Out: -   Physical Exam: BP 105/73  Pulse 96  Temp(Src) 98.7 F (37.1 C) (Oral)  Resp 19  Ht 5' (1.524 m)  Wt 198 lb 12.8 oz (90.175 kg)  BMI 38.83 kg/m2  SpO2 92% HEENT:  Normal Lungs:  Bilateral tight wheezing ant and post. Cor:  RR Abd:  Nontender, + BS Ext:  No edema     Imaging: Imaging results have been reviewed  Tele: NSR   Assessment/Plan:   Active Problems:   Respiratory failure   Hypotension, unspecified   Altered mental status   Acute respiratory failure 1. Abn EKG/ AL TWI  1. Respiratory failure:  She has tight wheezing.  Normal LV function.  She needs aggressive pulmonary treatments.  I do not think this is a cardiac issue. She has marked wheezing and continued CO2 retention.   I have reviewed the importance of smoking cessation.   2. Abn ECG:  Troponins are negative despite severe respiratory failure requiring intubation.  Dr. Gwenlyn Found has suggested cath on Monday She is still wheezing but appears to be stable enough to have a cath We have discussed the risks /  benefits /  Options.  She understands and agrees to proceed.   3. Diastolic chf:  She has normal LV systolic function and mild diastolic dysfunction.   She has diuresed > 6 liters over the hospitalization .  I think most of her problems are due to COPD - she smokes 1 ppd.   She is not having any angina.    4. COPD:  She continues to smoke  .      Thayer Headings, Brooke Bonito., MD, [facility] 02/08/2014, 11:54 AM [address],  [place] Pager [phone]

## 2014-02-09 ENCOUNTER — Telehealth: Payer: Self-pay | Admitting: Pulmonary Disease

## 2014-02-09 ENCOUNTER — Encounter (HOSPITAL_COMMUNITY)
Admission: EM | Disposition: A | Discharge status: Home / Self Care | Payer: Self-pay | Source: Home / Self Care | Attending: Cardiovascular Disease

## 2014-02-09 DIAGNOSIS — R079 Chest pain, unspecified: Secondary | ICD-10-CM

## 2014-02-09 DIAGNOSIS — I5031 Acute diastolic (congestive) heart failure: Secondary | ICD-10-CM

## 2014-02-09 DIAGNOSIS — I209 Angina pectoris, unspecified: Secondary | ICD-10-CM | POA: Diagnosis present

## 2014-02-09 DIAGNOSIS — I5033 Acute on chronic diastolic (congestive) heart failure: Secondary | ICD-10-CM

## 2014-02-09 HISTORY — PX: LEFT HEART CATHETERIZATION WITH CORONARY ANGIOGRAM: SHX5451

## 2014-02-09 LAB — GLUCOSE, CAPILLARY
GLUCOSE-CAPILLARY: 117 mg/dL — AB (ref 70–99)
GLUCOSE-CAPILLARY: 230 mg/dL — AB (ref 70–99)
Glucose-Capillary: 105 mg/dL — ABNORMAL HIGH (ref 70–99)
Glucose-Capillary: 114 mg/dL — ABNORMAL HIGH (ref 70–99)

## 2014-02-09 LAB — PROTIME-INR
INR: 1.11 (ref 0.00–1.49)
Prothrombin Time: 14.3 seconds (ref 11.6–15.2)

## 2014-02-09 LAB — BASIC METABOLIC PANEL
ANION GAP: 7 (ref 5–15)
BUN: 19 mg/dL (ref 6–23)
CALCIUM: 9.5 mg/dL (ref 8.4–10.5)
CO2: 37 mEq/L — ABNORMAL HIGH (ref 19–32)
Chloride: 92 mEq/L — ABNORMAL LOW (ref 96–112)
Creatinine, Ser: 0.65 mg/dL (ref 0.50–1.10)
Glucose, Bld: 140 mg/dL — ABNORMAL HIGH (ref 70–99)
Potassium: 3.9 mEq/L (ref 3.7–5.3)
SODIUM: 136 meq/L — AB (ref 137–147)

## 2014-02-09 SURGERY — LEFT HEART CATHETERIZATION WITH CORONARY ANGIOGRAM
Anesthesia: LOCAL

## 2014-02-09 MED ORDER — HEPARIN SODIUM (PORCINE) 1000 UNIT/ML IJ SOLN
INTRAMUSCULAR | Status: AC
  Filled 2014-02-09: qty 1

## 2014-02-09 MED ORDER — SODIUM CHLORIDE 0.9 % IV SOLN
INTRAVENOUS | Status: DC
  Administered 2014-02-09: 06:00:00 via INTRAVENOUS

## 2014-02-09 MED ORDER — INSULIN ASPART 100 UNIT/ML ~~LOC~~ SOLN: 0.0000 [IU] | Freq: Three times a day (TID) | SUBCUTANEOUS | Status: DC

## 2014-02-09 MED ORDER — ONDANSETRON HCL 4 MG/2ML IJ SOLN: 4.0000 mg | Freq: Four times a day (QID) | INTRAMUSCULAR | Status: DC | PRN

## 2014-02-09 MED ORDER — ASPIRIN 81 MG PO CHEW
81.0000 mg | CHEWABLE_TABLET | ORAL | Status: AC
  Administered 2014-02-09: 81 mg via ORAL
  Filled 2014-02-09: qty 1

## 2014-02-09 MED ORDER — VERAPAMIL HCL 2.5 MG/ML IV SOLN
INTRAVENOUS | Status: AC
  Filled 2014-02-09: qty 2

## 2014-02-09 MED ORDER — ACETAMINOPHEN 325 MG PO TABS: 650.0000 mg | ORAL_TABLET | ORAL | Status: DC | PRN

## 2014-02-09 MED ORDER — SODIUM CHLORIDE 0.9 % IJ SOLN
3.0000 mL | Freq: Two times a day (BID) | INTRAMUSCULAR | Status: DC
  Administered 2014-02-09: 3 mL via INTRAVENOUS

## 2014-02-09 MED ORDER — SODIUM CHLORIDE 0.9 % IV SOLN
INTRAVENOUS | Status: AC
  Administered 2014-02-09 (×2): via INTRAVENOUS

## 2014-02-09 MED ORDER — HEPARIN (PORCINE) IN NACL 2-0.9 UNIT/ML-% IJ SOLN
INTRAMUSCULAR | Status: AC
  Filled 2014-02-09: qty 1000

## 2014-02-09 MED ORDER — SODIUM CHLORIDE 0.9 % IJ SOLN: 3.0000 mL | INTRAMUSCULAR | Status: DC | PRN

## 2014-02-09 MED ORDER — SODIUM CHLORIDE 0.9 % IV SOLN: 250.0000 mL | INTRAVENOUS | Status: DC | PRN

## 2014-02-09 MED ORDER — INSULIN ASPART 100 UNIT/ML ~~LOC~~ SOLN: 0.0000 [IU] | SUBCUTANEOUS | Status: DC

## 2014-02-09 MED ORDER — LIDOCAINE HCL (PF) 1 % IJ SOLN
INTRAMUSCULAR | Status: AC
  Filled 2014-02-09: qty 30

## 2014-02-09 MED ORDER — ASPIRIN 81 MG PO CHEW: 81.0000 mg | CHEWABLE_TABLET | Freq: Every day | ORAL | Status: DC

## 2014-02-09 MED ORDER — NITROGLYCERIN 1 MG/10 ML FOR IR/CATH LAB
INTRA_ARTERIAL | Status: AC
  Filled 2014-02-09: qty 10

## 2014-02-09 MED ORDER — MORPHINE SULFATE 2 MG/ML IJ SOLN: 1.0000 mg | INTRAMUSCULAR | Status: DC | PRN

## 2014-02-09 NOTE — Progress Notes (Signed)
Pt.is A/Ox4 and is ambulatory with 1 person assist to the [facility]. She ambulated with walker and PT during the shift. Pt.had cardiac cath done with TR to right radial. Pt.had no c/o pain and no hematoma felt. She tolerated her advanced diet well.

## 2014-02-09 NOTE — Progress Notes (Signed)
NUTRITION FOLLOW UP  INTERVENTION: No nutrition intervention warranted at this time  RD to continue to follow  NUTRITION DIAGNOSIS: Inadequate oral intake, resolved  New Goal: Pt to meet >/= 90% of their estimated nutrition needs, met  Monitor:  PO & supplemental intake, weight, labs, I/O's  ASSESSMENT: Pt with hx of COPD, CHF and schizophrenia. Pt has not taken her lasix for 4 days, admitted from urgent care with SOB.   Patient extubated 9/18.  Transferred to 3E-CHF 9/19.  Patient currently NPO.  Appetite good.  Prior to NPO status was on a Heart Healthy diet with average PO intake of 65% per flowsheet records.  Hopeful discharge soon.  Height: Ht Readings from Last 1 Encounters:  02/07/14 5' (1.524 m)    Weight: Wt Readings from Last 1 Encounters:  02/09/14 198 lb 6.3 oz (89.99 kg)    BMI:  Body mass index is 38.75 kg/(m^2).  Re-estimated Nutritional Needs: Kcal: 2000-2200 Protein: 100-110 gm Fluid:2.0-2.2 L  Skin: rash on buttocks   Diet Order: NPO   Intake/Output Summary (Last 24 hours) at 02/09/14 1344 Last data filed at 02/09/14 1252  Gross per 24 hour  Intake    246 ml  Output    950 ml  Net   -704 ml    Labs:   Recent Labs Lab 02/05/14 0500 02/06/14 0500 02/07/14 0431 02/08/14 0543 02/09/14 0557  NA 137 140 139 138 136*  K 4.2 3.7 4.3 4.1 3.9  CL 92* 92* 95* 93* 92*  CO2 33* 38* 33* 32 37*  BUN 24* 36* 40* 31* 19  CREATININE 0.65 0.62 0.76 0.74 0.65  CALCIUM 9.9 10.2 9.6 9.6 9.5  MG 2.0 1.9 2.1  --   --   PHOS 3.1 3.9 3.4  --   --   GLUCOSE 124* 99 97 101* 140*    CBG (last 3)   Recent Labs  02/08/14 2346 02/09/14 0447 02/09/14 1316  GLUCAP 99 105* 117*    Scheduled Meds: . antiseptic oral rinse  7 mL Mouth Rinse BID  . aspirin EC  81 mg Oral Daily  . atorvastatin  10 mg Oral q1800  . enoxaparin (LOVENOX) injection  40 mg Subcutaneous Q24H  . furosemide  40 mg Oral Daily  . insulin aspart  0-15 Units Subcutaneous 6  times per day  . ipratropium-albuterol  3 mL Nebulization TID  . multivitamin with minerals  1 tablet Oral Daily  . predniSONE  40 mg Oral Q breakfast  . risperiDONE  2 mg Per Tube QHS  . sodium chloride  3 mL Intravenous Q12H  . sodium chloride  3 mL Intravenous Q12H    Continuous Infusions: . [START ON 02/10/2014] sodium chloride 75 mL/hr at 02/09/14 0601    Past Medical History  Diagnosis Date  . GERD (gastroesophageal reflux disease)   . Schizophrenia   . Tobacco abuse   . Hyperlipemia   . Hypercholesteremia   . CHF (congestive heart failure)   . Diabetes mellitus without complication     Past Surgical History  Procedure Laterality Date  . None      Arthur Holms, RD, LDN Pager #: [phone] After-Hours Pager #: [phone]

## 2014-02-09 NOTE — Telephone Encounter (Signed)
[email or web address] Castillo, CMA  

## 2014-02-09 NOTE — Progress Notes (Signed)
PULMONARY / CRITICAL CARE MEDICINE   Name: Kim [name] MRN: [id number] DOB: February 02, 1956    ADMISSION DATE:  02/03/2014 CONSULTATION DATE:  02/03/2014  REFERRING MD :  Dr. Elias Else  CHIEF COMPLAINT:  Dyspnea  INITIAL PRESENTATION: 58 year old schizophrenic, heavy smoker presented to [facility] 9/15 c/o SOB from urgent care where she was requiring 5L O2. Has not taken lasix for 4 days, CXR c/w edema. Intubated in ED. PCCM asked to see for vent management.   STUDIES:  9/16 ECHO >> nml systolic fxn, EF 40%, Grade I DD  SIGNIFICANT EVENTS: 9/15 > intubated, to ICU.   SUBJECTIVE: Pt up having breakfast , no distress.  O2 satn 89% on 4L ambulating  VITAL SIGNS: Temp:  [98 F (36.7 C)-98.7 F (37.1 C)] 98.1 F (36.7 C) (09/21 1038) Pulse Rate:  [81-91] 81 (09/21 1038) Resp:  [18-22] 18 (09/21 1038) BP: (106-138)/(72-86) 125/86 mmHg (09/21 1038) SpO2:  [85 %-98 %] 92 % (09/21 1038) Weight:  [89.99 kg (198 lb 6.3 oz)] 89.99 kg (198 lb 6.3 oz) (09/21 0714)  HEMODYNAMICS:    VENTILATOR SETTINGS:    INTAKE / OUTPUT:  Intake/Output Summary (Last 24 hours) at 02/09/14 1113 Last data filed at 02/09/14 1041  Gross per 24 hour  Intake    246 ml  Output      0 ml  Net    246 ml   PHYSICAL EXAMINATION: General:  Obese female in NAD Neuro:  AAOx4, speech clear, MAE HEENT:  [place]/AT, no JVD noted Cardiovascular:  RRR, no m/r/g Lungs:  resps even/non-labored, lungs bilaterally decreased , no rhonchi Abdomen:  Obese, soft, non-disteneded Musculoskeletal:  No acute deformity. +1 pitting edema to BLE Skin:  Intact, MMM  LABS:  CBC  Recent Labs Lab 02/06/14 0500 02/07/14 0431 02/08/14 0543  WBC 18.3* 16.9* 14.9*  HGB 18.6* 18.1* 18.0*  HCT 55.3* 53.2* 54.1*  PLT 210 190 213   Coag's  Recent Labs Lab 02/09/14 0552  INR 1.11    BMET  Recent Labs Lab 02/07/14 0431 02/08/14 0543 02/09/14 0557  NA 139 138 136*  K 4.3 4.1 3.9  CL 95* 93* 92*  CO2 33* 32 37*  BUN 40* 31*  19  CREATININE 0.76 0.74 0.65  GLUCOSE 97 101* 140*   Electrolytes  Recent Labs Lab 02/05/14 0500 02/06/14 0500 02/07/14 0431 02/08/14 0543 02/09/14 0557  CALCIUM 9.9 10.2 9.6 9.6 9.5  MG 2.0 1.9 2.1  --   --   PHOS 3.1 3.9 3.4  --   --    Sepsis Markers No results found for this basename: LATICACIDVEN, PROCALCITON, O2SATVEN,  in the last 168 hours  ABG  Recent Labs Lab 02/04/14 0950 02/05/14 0427 02/06/14 0559  PHART 7.387 7.438 7.476*  PCO2ART 62.7* 53.8* 49.5*  PO2ART 87.0 92.4 68.6*   Liver Enzymes No results found for this basename: AST, ALT, ALKPHOS, BILITOT, ALBUMIN,  in the last 168 hours  Cardiac Enzymes  Recent Labs Lab 02/03/14 1937 02/04/14 0335 02/04/14 0850 02/04/14 1500  TROPONINI <0.30 <0.30 <0.30 <0.30  PROBNP 2600.0*  --   --   --    Glucose  Recent Labs Lab 02/08/14 1150 02/08/14 1632 02/08/14 2008 02/08/14 2120 02/08/14 2346 02/09/14 0447  GLUCAP 94 151* 225* 153* 99 105*    Imaging No results found.   ASSESSMENT / PLAN:  PULMONARY OETT 9/15>>>9/18 A: Acute hypercarbic respiratory failure Pulmonary edema H/o ? COPD without evidence of acute exacerbation Tobacco Abuse  P:  Duonebs pred 40 - taper over 2 wks Smoking cessation emphasized  CARDIOVASCULAR A:  Acute on likely chronic CHF Nonspecific EKG changes Hypotension -resolved Echo -nml LV fn P:  Management per cardiology Diuresis as BP / Sr Cr tolerates Continuing  statin ASA 81 mg  PRN Hydralazine  RENAL A:   Hypokalemia  Hypomagnesemia P:   Follow Bmet Replace electrolytes as indicated Lasix 40 po daily  ENDOCRINE A:   DM 2 P:   Holding metformin CBG monitoring, SSI  NEUROLOGIC A:   Acute encephalopathy in setting of hypercarbia - resolved P:   Resume risperidone   Today's Summary:   O2 reduced to 4L, continue mobilization / progression of activity.   Agree that this seems to be more of COPD flare than CHF Hope to discharge soon -  today if no cath, otherwise tomorrow Needs FU with TP in 1-2 weeks to reassess O2 needs   I have personally obtained a history, examined the patient, evaluated laboratory and imaging results, formulated the assessment and plan and placed orders.  Rigoberto Noel MD

## 2014-02-09 NOTE — Progress Notes (Signed)
SATURATION QUALIFICATIONS: (This note is used to comply with regulatory documentation for home oxygen)  Patient Saturations on 4 L/min  at Rest = 94%    Patient Saturations on 4 Liters of oxygen while Ambulating = 89-91%

## 2014-02-09 NOTE — H&P (View-Only) (Signed)
Subjective:  Pt intubated and sedated, on precedex, arousable and follows commands. VDRF  Objective:  Temp:  [97.9 F (36.6 C)-99.5 F (37.5 C)] 99.3 F (37.4 C) (09/16 1007) Pulse Rate:  [27-115] 59 (09/16 1007) Resp:  [12-32] 24 (09/16 1007) BP: (82-153)/(44-110) 132/106 mmHg (09/16 1000) SpO2:  [86 %-99 %] 96 % (09/16 1007) FiO2 (%):  [40 %-100 %] 60 % (09/16 0931) Weight:  [208 lb 5.4 oz (94.5 kg)-209 lb 7 oz (95 kg)] 208 lb 5.4 oz (94.5 kg) (09/16 0225) Weight change:   Intake/Output from previous day: 09/15 0701 - 09/16 0700 In: 63.8 [I.V.:53.8] Out: 1945 [Urine:1545; Emesis/NG output:400]  Intake/Output from this shift: Total I/O In: 37.9 [I.V.:37.9] Out: 400 [Urine:400]  Physical Exam: General appearance: alert, no distress and toxic Neck: no adenopathy, no carotid bruit, no JVD, supple, symmetrical, trachea midline and thyroid not enlarged, symmetric, no tenderness/mass/nodules Lungs: clear to auscultation bilaterally Heart: regular rate and rhythm, S1, S2 normal, no murmur, click, rub or gallop Extremities: extremities normal, atraumatic, no cyanosis or edema  Lab Results: Results for orders placed during the hospital encounter of 02/03/14 (from the past 48 hour(s))  PRO B NATRIURETIC PEPTIDE     Status: Abnormal   Collection Time    02/03/14  7:37 PM      Result Value Ref Range   Pro B Natriuretic peptide (BNP) 2600.0 (*) 0 - 125 pg/mL  BASIC METABOLIC PANEL     Status: Abnormal   Collection Time    02/03/14  7:37 PM      Result Value Ref Range   Sodium 133 (*) 137 - 147 mEq/L   Potassium 4.5  3.7 - 5.3 mEq/L   Chloride 93 (*) 96 - 112 mEq/L   CO2 29  19 - 32 mEq/L   Glucose, Bld 99  70 - 99 mg/dL   BUN 8  6 - 23 mg/dL   Creatinine, Ser 0.56  0.50 - 1.10 mg/dL   Calcium 9.4  8.4 - 10.5 mg/dL   GFR calc non Af Amer >90  >90 mL/min   GFR calc Af Amer >90  >90 mL/min   Comment: (NOTE)     The eGFR has been calculated using the CKD EPI equation.       This calculation has not been validated in all clinical situations.     eGFR's persistently <90 mL/min signify possible Chronic Kidney     Disease.   Anion gap 11  5 - 15  CBC     Status: Abnormal   Collection Time    02/03/14  7:37 PM      Result Value Ref Range   WBC 10.5  4.0 - 10.5 K/uL   RBC 5.22 (*) 3.87 - 5.11 MIL/uL   Hemoglobin 17.0 (*) 12.0 - 15.0 g/dL   HCT 49.8 (*) 36.0 - 46.0 %   MCV 95.4  78.0 - 100.0 fL   MCH 32.6  26.0 - 34.0 pg   MCHC 34.1  30.0 - 36.0 g/dL   RDW 13.0  11.5 - 15.5 %   Platelets 198  150 - 400 K/uL  TROPONIN I     Status: None   Collection Time    02/03/14  7:37 PM      Result Value Ref Range   Troponin I <0.30  <0.30 ng/mL   Comment:            Due to the release kinetics of cTnI,  a negative result within the first hours     of the onset of symptoms does not rule out     myocardial infarction with certainty.     If myocardial infarction is still suspected,     repeat the test at appropriate intervals.  I-STAT ARTERIAL BLOOD GAS, ED     Status: Abnormal   Collection Time    02/03/14 10:54 PM      Result Value Ref Range   pH, Arterial 7.256 (*) 7.350 - 7.450   pCO2 arterial 71.3 (*) 35.0 - 45.0 mmHg   pO2, Arterial 79.0 (*) 80.0 - 100.0 mmHg   Bicarbonate 31.7 (*) 20.0 - 24.0 mEq/L   TCO2 34  0 - 100 mmol/L   O2 Saturation 93.0     Acid-Base Excess 1.0  0.0 - 2.0 mmol/L   Patient temperature 98.7 F     Collection site RADIAL, ALLEN'S TEST ACCEPTABLE     Drawn by Operator     Sample type ARTERIAL     Comment NOTIFIED PHYSICIAN    I-STAT ARTERIAL BLOOD GAS, ED     Status: Abnormal   Collection Time    02/04/14 12:03 AM      Result Value Ref Range   pH, Arterial 7.225 (*) 7.350 - 7.450   pCO2 arterial 83.7 (*) 35.0 - 45.0 mmHg   pO2, Arterial 155.0 (*) 80.0 - 100.0 mmHg   Bicarbonate 34.7 (*) 20.0 - 24.0 mEq/L   TCO2 37  0 - 100 mmol/L   O2 Saturation 99.0     Acid-Base Excess 3.0 (*) 0.0 - 2.0 mmol/L   Patient temperature  98.6 F     Collection site RADIAL, ALLEN'S TEST ACCEPTABLE     Drawn by Operator     Sample type ARTERIAL     Comment NOTIFIED PHYSICIAN    I-STAT ARTERIAL BLOOD GAS, ED     Status: Abnormal   Collection Time    02/04/14  1:57 AM      Result Value Ref Range   pH, Arterial 7.290 (*) 7.350 - 7.450   pCO2 arterial 68.9 (*) 35.0 - 45.0 mmHg   pO2, Arterial 230.0 (*) 80.0 - 100.0 mmHg   Bicarbonate 33.1 (*) 20.0 - 24.0 mEq/L   TCO2 35  0 - 100 mmol/L   O2 Saturation 100.0     Acid-Base Excess 3.0 (*) 0.0 - 2.0 mmol/L   Patient temperature 98.6 F     Collection site RADIAL, ALLEN'S TEST ACCEPTABLE     Drawn by Operator     Sample type ARTERIAL     Comment NOTIFIED PHYSICIAN    MRSA PCR SCREENING     Status: None   Collection Time    02/04/14  2:23 AM      Result Value Ref Range   MRSA by PCR NEGATIVE  NEGATIVE   Comment:            The GeneXpert MRSA Assay (FDA     approved for NASAL specimens     only), is one component of a     comprehensive MRSA colonization     surveillance program. It is not     intended to diagnose MRSA     infection nor to guide or     monitor treatment for     MRSA infections.  BASIC METABOLIC PANEL     Status: Abnormal   Collection Time    02/04/14  3:35 AM      Result  Value Ref Range   Sodium 134 (*) 137 - 147 mEq/L   Potassium 4.0  3.7 - 5.3 mEq/L   Chloride 92 (*) 96 - 112 mEq/L   CO2 29  19 - 32 mEq/L   Glucose, Bld 215 (*) 70 - 99 mg/dL   BUN 12  6 - 23 mg/dL   Creatinine, Ser 0.84  0.50 - 1.10 mg/dL   Calcium 8.8  8.4 - 10.5 mg/dL   GFR calc non Af Amer 75 (*) >90 mL/min   GFR calc Af Amer 87 (*) >90 mL/min   Comment: (NOTE)     The eGFR has been calculated using the CKD EPI equation.     This calculation has not been validated in all clinical situations.     eGFR's persistently <90 mL/min signify possible Chronic Kidney     Disease.   Anion gap 13  5 - 15  TROPONIN I     Status: None   Collection Time    02/04/14  3:35 AM       Result Value Ref Range   Troponin I <0.30  <0.30 ng/mL   Comment:            Due to the release kinetics of cTnI,     a negative result within the first hours     of the onset of symptoms does not rule out     myocardial infarction with certainty.     If myocardial infarction is still suspected,     repeat the test at appropriate intervals.  TSH     Status: None   Collection Time    02/04/14  3:35 AM      Result Value Ref Range   TSH 0.673  0.350 - 4.500 uIU/mL  CBC WITH DIFFERENTIAL     Status: Abnormal   Collection Time    02/04/14  3:35 AM      Result Value Ref Range   WBC 7.0  4.0 - 10.5 K/uL   RBC 5.03  3.87 - 5.11 MIL/uL   Hemoglobin 16.2 (*) 12.0 - 15.0 g/dL   HCT 49.2 (*) 36.0 - 46.0 %   MCV 97.8  78.0 - 100.0 fL   MCH 32.2  26.0 - 34.0 pg   MCHC 32.9  30.0 - 36.0 g/dL   RDW 13.4  11.5 - 15.5 %   Platelets 187  150 - 400 K/uL   Neutrophils Relative % 95 (*) 43 - 77 %   Neutro Abs 6.6  1.7 - 7.7 K/uL   Lymphocytes Relative 4 (*) 12 - 46 %   Lymphs Abs 0.3 (*) 0.7 - 4.0 K/uL   Monocytes Relative 1 (*) 3 - 12 %   Monocytes Absolute 0.1  0.1 - 1.0 K/uL   Eosinophils Relative 0  0 - 5 %   Eosinophils Absolute 0.0  0.0 - 0.7 K/uL   Basophils Relative 0  0 - 1 %   Basophils Absolute 0.0  0.0 - 0.1 K/uL  GLUCOSE, CAPILLARY     Status: Abnormal   Collection Time    02/04/14  4:53 AM      Result Value Ref Range   Glucose-Capillary 202 (*) 70 - 99 mg/dL  BLOOD GAS, ARTERIAL     Status: Abnormal   Collection Time    02/04/14  5:00 AM      Result Value Ref Range   FIO2 0.60     Delivery systems VENTILATOR     Mode  PRESSURE REGULATED VOLUME CONTROL     VT 350     Rate 22     Peep/cpap 5.0     pH, Arterial 7.265 (*) 7.350 - 7.450   pCO2 arterial 73.9 (*) 35.0 - 45.0 mmHg   Comment: CRITICAL RESULT CALLED TO, READ BACK BY AND VERIFIED WITH:     STEPHANIE MITCHEM,RN AT 8099, BY ARIELLE FOWLER RRT, RCP ON 02/04/2014   pO2, Arterial 96.9  80.0 - 100.0 mmHg   Bicarbonate  32.4 (*) 20.0 - 24.0 mEq/L   TCO2 34.7  0 - 100 mmol/L   Acid-Base Excess 5.8 (*) 0.0 - 2.0 mmol/L   O2 Saturation 96.6     Patient temperature 98.6     Collection site RIGHT RADIAL     Drawn by [phone]     Sample type ARTERIAL DRAW     Allens test (pass/fail) PASS  PASS  GLUCOSE, CAPILLARY     Status: Abnormal   Collection Time    02/04/14  8:25 AM      Result Value Ref Range   Glucose-Capillary 159 (*) 70 - 99 mg/dL  TROPONIN I     Status: None   Collection Time    02/04/14  8:50 AM      Result Value Ref Range   Troponin I <0.30  <0.30 ng/mL   Comment:            Due to the release kinetics of cTnI,     a negative result within the first hours     of the onset of symptoms does not rule out     myocardial infarction with certainty.     If myocardial infarction is still suspected,     repeat the test at appropriate intervals.  POCT I-STAT 3, ART BLOOD GAS (G3+)     Status: Abnormal   Collection Time    02/04/14  9:50 AM      Result Value Ref Range   pH, Arterial 7.387  7.350 - 7.450   pCO2 arterial 62.7 (*) 35.0 - 45.0 mmHg   pO2, Arterial 87.0  80.0 - 100.0 mmHg   Bicarbonate 37.6 (*) 20.0 - 24.0 mEq/L   TCO2 39  0 - 100 mmol/L   O2 Saturation 96.0     Acid-Base Excess 9.0 (*) 0.0 - 2.0 mmol/L   Patient temperature 37.4 C     Collection site RADIAL, ALLEN'S TEST ACCEPTABLE     Drawn by Operator     Sample type ARTERIAL     Comment NOTIFIED PHYSICIAN      Imaging: Imaging results have been reviewed  Assessment/Plan:   1. Active Problems: 2.   Respiratory failure 3.   Hypotension, unspecified 4.   Altered mental status 5.   Acute respiratory failure 6. EKG abnormalities 7. COPD 8. Schizophrenia  Time Spent Directly with Patient:  25 minutes  Length of Stay:  LOS: 1 day   Pt admitted with respiratory insufficiency requiring BiPAP followed by intubation. H/O COPD and tobacco abuse. PCCM following and helping with Vent. Enz neg. Labs OK otherwise. BNP 2600.  Good diuresis (-2.3 liters). EKG with Anterolateral TWI worrisome for LAD distribution ischemia. On lovenox for DVT prophylaxis. 2D pending. Will ultimately require cath once extubated to define anatomy. Suspect event multifactorial secondary to COPD with superimposed ischemia.   Lorretta Harp 02/04/2014, 10:23 AM

## 2014-02-09 NOTE — CV Procedure (Signed)
Kim [name] is a 58 y.o. female    [id number] LOCATION:  FACILITY: [place]  PHYSICIAN: Quay Burow, M.D. 1956-02-18   DATE OF PROCEDURE:  02/09/2014  DATE OF DISCHARGE:     CARDIAC CATHETERIZATION     History obtained from chart review.[name] is a 58 year old moderately overweight Caucasian female with schizophrenia who was admitted on 02/03/14 with respiratory distress. She had BiPAP and ultimately required intubation secondary to COPD flare and pulmonary edema. A 2-D echocardiogram revealed normal LV function. Her EKG did show anterolateral T wave inversion. She ruled out for myocardial infarction by enzymes. Because of her tobacco abuse and fairly abrupt onset of poorly edema it was concerning that this could potentially have been ischemically mediated and therefore she presents today for catheterization to define her anatomy.   PROCEDURE DESCRIPTION:   The patient was brought to the second floor [facility] cath lab in the postabsorptive state. She was not premedicated  Her right wristwas prepped and shaved in usual sterile fashion. Xylocaine 1% was used for local anesthesia. A 5 French sheath was inserted into the right radial artery using standard Seldinger technique. The patient received 4500 units  of heparin  intravenously.  A 5 [place] TIG catheter and pigtail catheter were used for selective coronary angiography and obtaining left heart pressures.left ventriculography was not performed since her EF was already known from prior echocardiogram. Omnipaque dye was used for the entirety of the case. Retrograde aortic, left ventricular end pullback pressures were recorded.    HEMODYNAMICS:    AO SYSTOLIC/AO DIASTOLIC: 518/84   LV SYSTOLIC/LV DIASTOLIC: 166/06  ANGIOGRAPHIC RESULTS:   1. Left main; normal  2. LAD; normal 3. Left circumflex; normal.  4. Right coronary artery; dominant and normal 5. Left ventriculography;not performed  IMPRESSION:Normal coronary  arteries. I believe her respiratory decompensation with primarily pulmonary no component of diastolic dysfunction and heart failure cannot be ruled out. There is no ischemic substrate. The radial sheath was removed and  A TR band was placed on the right wrist to achieve patent hemostasis. The patient left the lab in stable condition.    Lorretta Harp MD, [facility] 02/09/2014 5:06 PM

## 2014-02-09 NOTE — Progress Notes (Signed)
Patient Name: Kim [name] Date of Encounter: 02/09/2014  Active Problems:   Acute diastolic heart failure   Angina, class III - with acute DHF   Respiratory failure   Hypotension, unspecified   Altered mental status   Acute respiratory failure    Patient Profile: 58 yo female w/ hx DM2, tob abuse, COPD, s/p remote thoracentesis for parapneumonic effusion, ?CHF (on lasix for LE edema) and schizophrenia. Admitted 09/15 for SOB, ETT 09/15-18, Echo w/ diast dysf, ECG w/ anterior TWI.    SUBJECTIVE: Pt says breathing better, not on home O2, no chest pain. Asking if she can go home today. Agrees she needs to quit tobacco.  OBJECTIVE Filed Vitals:   02/08/14 2241 02/09/14 0214 02/09/14 0714 02/09/14 1038  BP: 106/78 134/78 138/86 125/86  Pulse: 86 81 88 81  Temp: 98.3 F (36.8 C) 98.1 F (36.7 C) 98 F (36.7 C) 98.1 F (36.7 C)  TempSrc: Oral Oral Oral Oral  Resp: 22 22 20 18   Height:      Weight:   198 lb 6.3 oz (89.99 kg)   SpO2: 98% 95% 98% 92%    Intake/Output Summary (Last 24 hours) at 02/09/14 1323 Last data filed at 02/09/14 1252  Gross per 24 hour  Intake    246 ml  Output    950 ml  Net   -704 ml   Filed Weights   02/07/14 1500 02/08/14 0500 02/09/14 0714  Weight: 200 lb 9.9 oz (91 kg) 198 lb 12.8 oz (90.175 kg) 198 lb 6.3 oz (89.99 kg)    PHYSICAL EXAM General: Well developed, well nourished, female in no acute distress. Head: Normocephalic, atraumatic.  Neck: Supple without bruits, JVD minimal elevation. Lungs:  Resp regular and unlabored, few dry rales. Heart: RRR, S1, S2, no S3, S4, soft murmur; no rub. Abdomen: Soft, non-tender, non-distended, BS + x 4.  Extremities: No clubbing, cyanosis, trace edema.  Neuro: Alert and oriented X 3. Moves all extremities spontaneously. Psych: Normal affect.  LABS: CBC:  Recent Labs  02/07/14 0431 02/08/14 0543  WBC 16.9* 14.9*  HGB 18.1* 18.0*  HCT 53.2* 54.1*  MCV 96.6 97.8  PLT 190 213    INR:  Recent Labs  02/09/14 0552  INR 6.76   Basic Metabolic Panel:  Recent Labs  02/07/14 0431 02/08/14 0543 02/09/14 0557  NA 139 138 136*  K 4.3 4.1 3.9  CL 95* 93* 92*  CO2 33* 32 37*  GLUCOSE 97 101* 140*  BUN 40* 31* 19  CREATININE 0.76 0.74 0.65  CALCIUM 9.6 9.6 9.5  MG 2.1  --   --   PHOS 3.4  --   --    BNP: Pro B Natriuretic peptide (BNP)  Date/Time Value Ref Range Status  02/03/2014  7:37 PM 2600.0* 0 - 125 pg/mL Final  08/11/2008  3:15 PM <30.0  0.0 - 100.0 pg/mL Final   TELE: SR, ST, no sig ectopy     Radiology/Studies: Dg Chest Port 1 View 02/06/2014   CLINICAL DATA:  Evaluate endotracheal tube position  EXAM: PORTABLE CHEST - 1 VIEW  COMPARISON:  Portable chest x-ray of 02/05/2014  FINDINGS: The tip of the endotracheal tube is approximately 3.9 cm above the carina. Right IJ central venous catheter overlies the mid SVC. The lungs are relatively well aerated. Cardiomegaly is stable.  IMPRESSION: 1. Tip of endotracheal tube 3.9 cm above carina. 2. Cardiomegaly.  Lungs are well aerated.   Electronically Signed  By: Ivar Drape M.D.   On: 02/06/2014 07:53   Dg Abd Portable 1v 02/04/2014   CLINICAL DATA:  OG tube placement  EXAM: PORTABLE ABDOMEN - 1 VIEW  COMPARISON:  12/15/2010; chest radiograph- 02/04/2014  FINDINGS: Enteric tube tip and side port projects over the expected location of the mid body of the stomach. Interval decrease in previously noted marked gaseous distention of the stomach.  There is persistent moderate gaseous distention of the colon with index loop of descending colon measuring approximately 6.6 cm in diameter. There is very mild gaseous distention of the distal small bowel.  No supine evidence of pneumoperitoneum. No pneumatosis or portal venous gas.  No definite abnormal intra-abdominal calcifications.  Limited visualization of the lower thorax demonstrates bibasilar heterogeneous opacities, left greater than right with suspected trace  bilateral effusions.  No acute osseus abnormalities.  IMPRESSION: 1. Enteric tube tip and side port projects over the mid body of the stomach. 2. Persistent finding suggestive of ileus.   Electronically Signed   By: Sandi Mariscal M.D.   On: 02/04/2014 08:00     Current Medications:  . antiseptic oral rinse  7 mL Mouth Rinse BID  . aspirin EC  81 mg Oral Daily  . atorvastatin  10 mg Oral q1800  . enoxaparin (LOVENOX) injection  40 mg Subcutaneous Q24H  . furosemide  40 mg Oral Daily  . insulin aspart  0-15 Units Subcutaneous 6 times per day  . ipratropium-albuterol  3 mL Nebulization TID  . multivitamin with minerals  1 tablet Oral Daily  . predniSONE  40 mg Oral Q breakfast  . risperiDONE  2 mg Per Tube QHS  . sodium chloride  3 mL Intravenous Q12H  . sodium chloride  3 mL Intravenous Q12H   . [START ON 02/10/2014] sodium chloride 75 mL/hr at 02/09/14 0601    ASSESSMENT AND PLAN: 1. Respiratory failure: She has had tight wheezing. Normal LV function. She needs aggressive pulmonary treatments. I do not think this is a cardiac issue. She is on steroids and nebs.  She has had marked wheezing and continued O2 needs (not on home O2).  2. Abn ECG: Troponins were negative despite severe respiratory failure requiring intubation.  Dr. Gwenlyn Found had suggested cath on Monday but Dr. Acie Fredrickson was not convinced that we need to do a cath at this point. He felt a Lexiscan myoview could be done but could wait until her lungs are better.  She is not having any angina.  3. Diastolic chf: She has normal LV systolic function and mild diastolic dysfunction. She has diuresed > 7 liters over the hospitalization . At this point, most of her resp problems are due to COPD - she smoked 3 ppd. Now on oral Lasix.  4. COPD: She continues to smoke 3 ppd. She had very tight wheezing 09/20, improved.   Active Problems:   Acute diastolic heart failure   Angina, class III - with acute DHF   Respiratory failure    Hypotension, unspecified   Altered mental status   Acute respiratory failure   Signed, Rosaria Ferries , PA-C 1:23 PM 02/09/2014  I have seen and evaluated the patient this PM along with Rosaria Ferries, PA. I agree with her findings, examination as well as impression recommendations.  Overall seems to have improved over the weekend.  Lungs sound much better.  What remains unclear is the extent of Cardiac vs. Primary pulmonary component led to her acute exacerbation.   The initial feeling  was that with concern for Pulm Edema leading to intubation & anterior ST-T waves concerning for ischemia, that cath was the best choice.  He Echo was not revealing with normal systolic function.  Dr. Elmarie Shiley initial thought was that Myoview would be acceptable, but the following day noted that the plan was to proceed with cardiac cath today.    I am concerned about the severity of her presentation & would like to exclude CAD definitively with her EKG findings.  If no obstructive CAD - can continue Rx of DHF & COPD, but if + CAD & presenting Sx concerning for Class III-IV exertional Angina-Dyspnea, PCI would be indicated & hopefully decrease potential for re-admission.  PLAN: LHC +/- PCI today per Dr. Kennon Holter original plan. Continue PO Lasix - LVEDP will help determine intensity of Rx Afterload agents not started due to initial hypotension on admission -- LHC results will help determine appropriate Rx.    Leonie Man, M.D., M.S. Interventional Cardiologist   Pager # [phone]

## 2014-02-09 NOTE — Interval H&P Note (Signed)
Cath Lab Visit (complete for each Cath Lab visit)  Clinical Evaluation Leading to the Procedure:   ACS: Yes.    Non-ACS:    Anginal Classification: CCS IV  Anti-ischemic medical therapy: No Therapy  Non-Invasive Test Results: No non-invasive testing performed  Prior CABG: No previous CABG      History and Physical Interval Note:  02/09/2014 4:37 PM  Kim [name]  has presented today for surgery, with the diagnosis of cp  The various methods of treatment have been discussed with the patient and family. After consideration of risks, benefits and other options for treatment, the patient has consented to  Procedure(s): LEFT HEART CATHETERIZATION WITH CORONARY ANGIOGRAM (N/A) as a surgical intervention .  The patient's history has been reviewed, patient examined, no change in status, stable for surgery.  I have reviewed the patient's chart and labs.  Questions were answered to the patient's satisfaction.     Lorretta Harp

## 2014-02-09 NOTE — Progress Notes (Signed)
Physical Therapy Treatment Patient Details Name: Kim [name] MRN: [id number] DOB: 24-Aug-1955 Today's Date: 02/09/2014    History of Present Illness Pt adm 9/15 with respiratory failure and acute on chronic heart failure. Intubated on admit and extubated 9/18. PMH- schizophrenia, DM, COPD. Lives in group home    PT Comments    Pt moving well on 4 L/min o2 with cues for safety as she moves quickly and impulsively at times, but no LOB.  Follow Up Recommendations  No PT follow up     Equipment Recommendations  None recommended by PT    Recommendations for Other Services       Precautions / Restrictions Restrictions Weight Bearing Restrictions: No    Mobility  Bed Mobility                  Transfers       Sit to Stand: Supervision         General transfer comment: Pt standing from bed with quick impulsive movements.  Ambulation/Gait Ambulation/Gait assistance: Min guard;Supervision Ambulation Distance (Feet): 300 Feet (x 2 ) Assistive device: Rolling walker (2 wheeled) Gait Pattern/deviations: Shuffle;Step-through pattern   Gait velocity interpretation: at or above normal speed for age/gender General Gait Details: shuffeling gait pattern but responded well to verbal cues to improve step length.  o2 on 4 L/min and 89-91% at end of gait.   Stairs            Wheelchair Mobility    Modified Rankin (Stroke Patients Only)       Balance Overall balance assessment: No apparent balance deficits (not formally assessed)           Standing balance-Leahy Scale: Fair                      Cognition Arousal/Alertness: Awake/alert Behavior During Therapy: WFL for tasks assessed/performed Overall Cognitive Status: Within Functional Limits for tasks assessed                      Exercises      General Comments General comments (skin integrity, edema, etc.): Pt reaching out of BOS with activities in room prior to gait.  Reaching  under bed for shoes.  Pt can be impulsive and educated on safety to decrease fall risk.      Pertinent Vitals/Pain  o2 94% on 4 L/min at rest and 89-91% after gait    Home Living                      Prior Function            PT Goals (current goals can now be found in the care plan section) Acute Rehab PT Goals Patient Stated Goal: Return home PT Goal Formulation: With patient Time For Goal Achievement: 02/14/14 Potential to Achieve Goals: Good Progress towards PT goals: Progressing toward goals    Frequency  Min 3X/week    PT Plan Current plan remains appropriate    Co-evaluation             End of Session Equipment Utilized During Treatment: Gait belt;Oxygen Activity Tolerance: Patient tolerated treatment well Patient left: in chair;with call bell/phone within reach     Time: 0940-1009 PT Time Calculation (min): 29 min  Charges:  $Gait Training: 23-37 mins                    G Codes:  Giulliana Mcroberts LUBECK 02/09/2014, 11:07 AM

## 2014-02-10 DIAGNOSIS — I2510 Atherosclerotic heart disease of native coronary artery without angina pectoris: Secondary | ICD-10-CM | POA: Clinically undetermined

## 2014-02-10 DIAGNOSIS — J9620 Acute and chronic respiratory failure, unspecified whether with hypoxia or hypercapnia: Secondary | ICD-10-CM

## 2014-02-10 LAB — GLUCOSE, CAPILLARY
Glucose-Capillary: 106 mg/dL — ABNORMAL HIGH (ref 70–99)
Glucose-Capillary: 112 mg/dL — ABNORMAL HIGH (ref 70–99)

## 2014-02-10 MED ORDER — TIOTROPIUM BROMIDE MONOHYDRATE 18 MCG IN CAPS: 18.0000 ug | ORAL_CAPSULE | Freq: Every day | RESPIRATORY_TRACT | Status: DC

## 2014-02-10 MED ORDER — FUROSEMIDE 20 MG PO TABS
20.0000 mg | ORAL_TABLET | Freq: Every day | ORAL | Status: DC
  Administered 2014-02-10: 20 mg via ORAL
  Filled 2014-02-10: qty 1

## 2014-02-10 MED ORDER — ALBUTEROL SULFATE (2.5 MG/3ML) 0.083% IN NEBU: 2.5000 mg | INHALATION_SOLUTION | Freq: Four times a day (QID) | RESPIRATORY_TRACT | Status: DC | PRN

## 2014-02-10 MED ORDER — ALBUTEROL SULFATE HFA 108 (90 BASE) MCG/ACT IN AERS: 2.0000 | INHALATION_SPRAY | Freq: Four times a day (QID) | RESPIRATORY_TRACT | Status: DC | PRN

## 2014-02-10 MED ORDER — PREDNISONE 20 MG PO TABS
40.0000 mg | ORAL_TABLET | Freq: Every day | ORAL | Status: DC
  Administered 2014-02-10: 40 mg via ORAL
  Filled 2014-02-10 (×2): qty 2

## 2014-02-10 MED ORDER — FUROSEMIDE 20 MG PO TABS: 20.0000 mg | ORAL_TABLET | Freq: Every day | ORAL | Status: DC | PRN

## 2014-02-10 MED ORDER — PREDNISONE 20 MG PO TABS: ORAL_TABLET | ORAL | Status: DC

## 2014-02-10 NOTE — Progress Notes (Signed)
Patient Name: Kim [name] Date of Encounter: 02/10/2014  Active Problems:   Acute diastolic heart failure   Angina, class III - related to Acute Diastolic HF & COPD Exacerbation; Non-obstructive CAD   Coronary artery disease, non-occlusive   Respiratory failure   Hypotension, unspecified   Altered mental status   Acute respiratory failure   Patient Profile: 58 yo female w/ hx DM2, tob abuse, COPD, s/p remote thoracentesis for parapneumonic effusion, ?CHF (on lasix for LE edema) and schizophrenia. Admitted 09/15 for SOB, ETT 09/15-18, Echo w/ diast dysf, ECG w/ anterior TWI.    SUBJECTIVE: Cardiac Cath yesterday: Angiographically normal coronary arteries. Left ventricular pressures 106/11/12 mmHg  Pt says breathing better, not on home O2, no chest pain.  Cough persistent, but no longer wheezing. Hopes to go home today. Agrees she needs to quit tobacco.  OBJECTIVE Filed Vitals:   02/09/14 2103 02/10/14 0100 02/10/14 0617 02/10/14 0841  BP:  117/79 108/68   Pulse:  73 100 79  Temp:  98.4 F (36.9 C) 98.2 F (36.8 C)   TempSrc:  Oral Oral   Resp:  20 22 18   Height:      Weight:   203 lb 14.8 oz (92.5 kg)   SpO2: 95% 97% 99% 97%    Intake/Output Summary (Last 24 hours) at 02/10/14 0909 Last data filed at 02/10/14 0618  Gross per 24 hour  Intake    486 ml  Output   2275 ml  Net  -1789 ml   Filed Weights   02/08/14 0500 02/09/14 0714 02/10/14 0617  Weight: 198 lb 12.8 oz (90.175 kg) 198 lb 6.3 oz (89.99 kg) 203 lb 14.8 oz (92.5 kg)    PHYSICAL EXAM General: Well developed, well nourished, female in no acute distress. Head: Normocephalic, atraumatic.  Neck: Supple without bruits, JVD minimal elevation. Lungs:  Resp regular and unlabored, few dry rales. Heart: RRR, S1, S2, no S3, S4, soft murmur; no rub. Abdomen: Soft, non-tender, non-distended, BS + x 4.  Extremities: No clubbing, cyanosis, trace edema.  Neuro: Alert and oriented X 3. Moves all extremities  spontaneously. Psych: Normal affect.  LABS: CBC:  Recent Labs  02/08/14 0543  WBC 14.9*  HGB 18.0*  HCT 54.1*  MCV 97.8  PLT 213   INR:  Recent Labs  02/09/14 0552  INR 4.31   Basic Metabolic Panel:  Recent Labs  02/08/14 0543 02/09/14 0557  NA 138 136*  K 4.1 3.9  CL 93* 92*  CO2 32 37*  GLUCOSE 101* 140*  BUN 31* 19  CREATININE 0.74 0.65  CALCIUM 9.6 9.5   BNP: Pro B Natriuretic peptide (BNP)  Date/Time Value Ref Range Status  02/03/2014  7:37 PM 2600.0* 0 - 125 pg/mL Final  08/11/2008  3:15 PM <30.0  0.0 - 100.0 pg/mL Final   TELE: SR, ST, no sig ectopy     Radiology/Studies: None today  Current Medications:  . antiseptic oral rinse  7 mL Mouth Rinse BID  . aspirin EC  81 mg Oral Daily  . atorvastatin  10 mg Oral q1800  . enoxaparin (LOVENOX) injection  40 mg Subcutaneous Q24H  . furosemide  20 mg Oral Daily  . insulin aspart  0-15 Units Subcutaneous TID AC & HS  . ipratropium-albuterol  3 mL Nebulization TID  . multivitamin with minerals  1 tablet Oral Daily  . predniSONE  40 mg Oral Q breakfast  . risperiDONE  2 mg Per Tube QHS  .  sodium chloride  3 mL Intravenous Q12H      ASSESSMENT AND PLAN: Active Problems:   Acute diastolic heart failure   Angina, class III - related to Acute Diastolic HF & COPD Exacerbation; Non-obstructive CAD   Coronary artery disease, non-occlusive   Respiratory failure   Hypotension, unspecified   Altered mental status   Acute respiratory failure   1. Respiratory failure: No further wheezing.  Echocardiographically Normal LV function with normal coronary arteries by cardiac catheterization. LVEDP was minimally elevated suggesting adequate diuresis. Lack of significant coronary findings with relative normal LVEDP would suggest that this is primarily a pulmonary issue. She needs aggressive pulmonary treatments. She is on steroids and nebs.  She has had marked wheezing and continued O2 needs (not on home O2).  2.  Abn ECG: Troponins were negative despite severe respiratory failure requiring intubation.  Dr. Gwenlyn Found had suggested cath on Monday but Dr. Acie Fredrickson was not convinced that we need to do a cath at this point. He felt a Lexiscan myoview could be done but could wait until her lungs are better.  She is not having any angina.  3. Diastolic chf:  Normal LV systolic function and mild diastolic dysfunction and post diuresis LVEDP of 12. This would suggest that she did have elevated LVEDP upon initial hospitalization based on the fact she is diuresis greater than 7 L.  BP still remains "soft" - would consider starting low dose ARB either on d/c or initial OP f/u. -- Irbesartan 75 (or comparable ARB dose)  Based on her presentation, I would keep her on a baseline oral Lasix dose at home. I reduce this to 20 mg daily. Would then recommend a sliding scale Lasix regimen as noted below: Sliding scale Lasix: Weigh yourself when you get home, then Daily in the Morning. Dry weight will be what Home scale says on the day Pt returns home.  Basal Lasix dose 20mg  q AM.  If you gain more than 3 pounds from dry weight: Increase the Lasix dosing to 20 mg in the morning and 20 mg in the afternoon until weight returns to baseline dry weight.  If weight gain is greater than 5 pounds in 2 days: Increased to Lasix 40 mg in the morning and 20 mg in the afternoon until weight returns to baseline dry weight.  Contact the office for further assistance if weight does not go down the next day.  If the weight goes down more than 3 pounds from dry weight: Hold Lasix until it returns to baseline dry weight  4. COPD: She continues to smoke 3 ppd. She had very tight wheezing 09/20, improved.   OK for d/c from Cardiac standpoint.  Will notify PCCM  Signed,   Leonie Man, M.D., M.S. Interventional Cardiologist   Pager # [phone]

## 2014-02-10 NOTE — Telephone Encounter (Signed)
Called to speak with [facility]- was told she was not working today-asked for a nurse there that could assist me in finding out what was needed for the patient. Spoke with Ander Purpura who stated she had the orders needed and nothing more needed from our office at this time.

## 2014-02-10 NOTE — Care Management Note (Addendum)
  Page 2 of 2   02/10/2014     4:21:18 PM CARE MANAGEMENT NOTE 02/10/2014  Patient:  [facility]   Account Number:  [id number]  Date Initiated:  02/04/2014  Documentation initiated by:  DAVIS,RHONDA  Subjective/Objective Assessment:   58 year old female presented to [facility] 9/15 c/o SOB from urgent care where she was requiring 5L O2. Has not taken Lasix for 4 days, CXR c/w Edema. Intubated in ED. sedated     Action/Plan:   paptient is from a group home [facility].Active Problems:    Respiratory failure    Hypotension, unspecified    Altered mental status    Acute respiratory failure  EKG abnormalities  COPD  Schizophrenia   Anticipated DC Date:  02/07/2014   Anticipated DC Plan:  GROUP HOME  In-house referral  Clinical Social Worker      DC Forensic scientist  CM consult      [facility] Choice  HOME HEALTH   Choice offered to / List presented to:  C-1 Patient   DME arranged  [facility]      DME agency  [place] arranged  HH-1 RN  [place].   Status of service:  Completed, signed off Medicare Important Message given?  YES (If response is "NO", the following Medicare IM given date fields will be blank) Date Medicare IM given:  02/09/2014 Medicare IM given by:  Auden Tatar Date Additional Medicare IM given:   Additional Medicare IM given by:    Discharge Disposition:  [place]  Per UR Regulation:  Reviewed for med. necessity/level of care/duration of stay  If discussed at [place] of Stay Meetings, dates discussed:   02/10/2014    Comments:  Mariann Laster RN, BSN, [place], CCM  Nurse - Case Manager,  (Unit [facility])  [phone]  02/10/2014 Heart Cath 02/09/2014 Dispo Plan:  Return to [facility] with HHS: RN and Home oxygen ([facility]:  [facility] and [place] notified) Contact for PCG/Lauren Alford [place], CNA at [employer] [phone] cell (Phone contact  provided to [place])    [id number]/KFMMCR [place],RN,BSN,CCM:

## 2014-02-10 NOTE — Progress Notes (Addendum)
[place] per MD for d/c today back to [facility].  While CSW was coordinating d/c back to facility- patient notified nursing staff that she had "called for a ride" and was escorted to the front entrance.  Actually the facility had not yet agreed to accept patient and was reviewing d/c summary and Fl2.  Patient returned to [facility] and was made comfortable in the conference room with monitoring by staff.  CSW completed d/c arrangements with Ander Purpura- and she came to pick up patient.  Reviewed with her d/c summary for "sliding scale lasix" and she verbalized understanding. HH RN will help to monitor medication issues.  CSW met with patient- she is alert, oriented and states that she is glad to return to the facility. "It is a nice place and they take good care of me."  Discussed with patient that she is now on oxygen and she is not to smoke anymore. She was able to verbalize understanding of this.  Fl2 completed and signed by MD- document along with DC summary sent with patient back to the facility. Patient states that she does not have any family to notify of her return to the group home  CSW signing off.   Lorie Phenix. Pauline Good, [place]

## 2014-02-10 NOTE — Progress Notes (Signed)
SATURATION QUALIFICATIONS: (This note is used to comply with regulatory documentation for home oxygen)  Patient Saturations on Room Air at Rest = 87%  Patient Saturations on Room Air while Ambulating = 86%  Patient Saturations on 2 Liters of oxygen while Ambulating = 90%  Please briefly explain why patient needs home oxygen: 

## 2014-02-10 NOTE — Discharge Summary (Signed)
Physician Discharge Summary  Patient ID: Kim [name] MRN: [id number] DOB/AGE: 12/01/1955 58 y.o.  Admit date: 02/03/2014 Discharge date: 02/10/2014    Discharge Diagnoses:  Acute Hypercarbic Respiratory Failure Questionable Pulmonary Edema Presumed COPD  Tobacco Abuse Acute on Chronic CHF Nonspecific EKG Changes Hypotension Hypokalemia Hypomagnesemia  Diabetes Mellitis II  Acute Encephalopathy                                                                         DISCHARGE PLAN BY DIAGNOSIS     Acute Hypercarbic Respiratory Failure Questionable Pulmonary Edema  Presumed COPD with Acute Exacerbation  Tobacco Abuse  Discharge Plan: Oxygen 4L for discharge.  This will need review at time of follow up, she may be able to stop using as she improves. Follow up with Pulmonary as below Start Spiriva, reviewed use and she indicates verbal understanding (has used before) Continue HFA Albuterol as first line for SOB/Wheezing & Nebulized albuterol as 2nd line for SOB if not relieved by [facility] Complete prednisone taper  Acute on Chronic CHF Nonspecific EKG Changes Hypotension Normal LHC 9/21  Discharge Plan: Consider low dose ARB at outpatient follow up with Cardiology Daily weights, instructed to perform daily weights.   Lasix 20 mg daily with provisional instructions as follows:    Sliding scale Lasix: Weigh yourself when you get home, then Daily in the Morning.   Dry weight will be what Home scale says on the day Pt returns home. Baseline Lasix dose 52m q AM.  If you gain more than 3 pounds from dry weight: Increase the Lasix dosing to 20 mg in the morning and 20 mg in the afternoon until weight returns to baseline dry weight.  If weight gain is greater than 5 pounds in 2 days: Increased to Lasix 40 mg in the morning and 20 mg in the afternoon until weight returns to baseline dry weight.  Contact the office for further assistance if weight does not go down the next day.   If the weight goes down more than 3 pounds from dry weight: Hold Lasix until it returns to baseline dry weight   Hypokalemia  Hypomagnesemia   Discharge Plan: Resolved at time of discharge.  Follow up BMP at time of office visit with cardiology to assess sr cr & K needs  Diabetes Mellitis II   Discharge Plan: Continue metformin   Acute Encephalopathy   Discharge Plan: Resolved, no further follow up                   DISCHARGE SUMMARY   Kim KOSCHis a 58y.o. y/o female with a PMH of DM2, ongoing tobacco abuse, presumed COPD, s/p remote thoracentesis for parapneumonic effusion, ?CHF (on lasix for LE edema) and schizophrenia [facility] complaints of shortness of breath, DOE, and fatigue x 4 days.  Apparently, the patient had not been taking lasix at home.  Initially she presented to the Urgent Care with productive cough and subjective fevers. [facility] requiring 5 L 02. Given duoneb and 125 of solumedrol prior to transfer without significant improvement in symptoms.  She was noted to have increased accessory  muscle usage, desaturations to high 80's on 5L and was placed on BiPAP.  She also was treated with IV lasix and NTG paste.  The patient was evaluated by Cardiology for admission.  Initial EKG revealed sinus tachycardia at 100 with diffuse ST inversions new from several years ago (in [place] but not Epic). CXR c/w fluid overload . Labs were significant for BNP of 2000.  ABG prior to BiPAP was notable for 7.24 / 72 / 79.  She later developed hypotension and lethargy requiring intubation.  Patient was admitted to ICU for further evaluation / stabilization.  ECHO revealed normal systolic function with an EF of 55% & Grade 1 diastolic dysfunction.  PCCM was consulted for evaluation due to acute respiratory failure.  She was treated with IV steroids, nebulized bronchodilators, and vasopressors for hypotension.    Cardiac enzymes were cycled and negative.  Post hypotension, the patient was diuresed.  Her EKG trend was notable for anterolateral TWI worrisome for LAD distribution ischemia.  Patient remained on mechanical ventilation until 9/18 at which time she was liberated from support.  Post extubation, she underwent LHC on 9/21 which was negative for coronary disease.  At time of discharge, it was felt that respiratory failure and initial symptoms were related to an acute exacerbation of COPD +/- a component of diastolic dysfunction.   LVEDP was 12 post 7 L diuresis, which may suggest that this was mildly elevated before indicative of diastolic dysfunction           STUDIES:  9/16 ECHO >> nml systolic fxn, EF 19%, Grade I DD  9/21 LHC >> Normal coronary arteries.    SIGNIFICANT EVENTS:  9/15 > Intubated, to ICU.  9/18 > Extubation to [place] O2  Discharge Exam: General: Obese female in NAD  Neuro: AAOx4, speech clear, MAE  HEENT: [place]/AT, no JVD noted  Cardiovascular: RRR, no m/r/g  Lungs: resps even/non-labored, lungs bilaterally decreased , no rhonchi  Abdomen: Obese, soft, non-disteneded  Musculoskeletal: No acute deformity. +1 pitting edema to BLE  Skin: Intact, MMM   Filed Vitals:   02/09/14 2103 02/10/14 0100 02/10/14 0617 02/10/14 0841  BP:  117/79 108/68   Pulse:  73 100 79  Temp:  98.4 F (36.9 C) 98.2 F (36.8 C)   TempSrc:  Oral Oral   Resp:  _0 Height:      Weight:   203 lb 14.8 oz (92.5 kg)   SpO2: 95% 97% 99% 97%     Discharge Labs  BMET  Recent Labs Lab 02/04/14 0335 02/05/14 0500 02/06/14 0500 02/07/14 0431 02/08/14 0543 02/09/14 0557  NA 134* 137 140 139 138 136*  K 4.0 4.2 3.7 4.3 4.1 3.9  CL 92* 92* 92* 95* 93* 92*  CO2 29 33* 38* 33* 32 37*  GLUCOSE 215* 124* 99 97 101* 140*  BUN 12 24* 36* 40* 31* 19  CREATININE 0.84 0.65 0.62 0.76 0.74 0.65  CALCIUM 8.8 9.9 10.2 9.6 9.6 9.5  MG  --  2.0 1.9 2.1  --   --   PHOS  --  3.1 3.9 3.4  --   --      CBC  Recent Labs Lab 02/06/14 0500 02/07/14 0431 02/08/14 0543  HGB 18.6* 18.1* 18.0*  HCT 55.3* 53.2* 54.1*  WBC 18.3* 16.9* 14.9*  PLT 210 190 213   Anti-Coagulation  Recent Labs Lab 02/09/14 0552  INR 1.11      Medication List  albuterol (2.5 MG/3ML) 0.083% nebulizer solution  Commonly known as:  PROVENTIL  Take 3 mLs (2.5 mg total) by nebulization every 6 (six) hours as needed for wheezing or shortness of breath.     albuterol 108 (90 BASE) MCG/ACT inhaler  Commonly known as:  PROVENTIL HFA;VENTOLIN HFA  Inhale 2 puffs into the lungs every 6 (six) hours as needed for shortness of breath (Use as first line for wheezing / shortness of breath.).     cholecalciferol 400 UNITS Tabs tablet  Commonly known as:  VITAMIN D  Take 400 Units by mouth daily.     furosemide 20 MG tablet  Commonly known as:  LASIX  Take 1 tablet (20 mg total) by mouth daily as needed for fluid.     metFORMIN 750 MG 24 hr tablet  Commonly known as:  GLUCOPHAGE-XR  Take 750 mg by mouth daily with breakfast.     predniSONE 20 MG tablet  Commonly known as:  DELTASONE  Take 2 tabs for 2 days, then 1 tab for 2 days, then 1/2 tab for 2 days     risperiDONE 1 MG tablet  Commonly known as:  RISPERDAL  Take 2 mg by mouth at bedtime.     rosuvastatin 5 MG tablet  Commonly known as:  CRESTOR  Take 5 mg by mouth daily.     tiotropium 18 MCG inhalation capsule  Commonly known as:  SPIRIVA HANDIHALER  Place 1 capsule (18 mcg total) into inhaler and inhale daily.        Follow-up Information   Follow up with PARRETT,TAMMY, NP On 02/17/2014. (Appt at 2:00 PM)    Specialty:  Nurse Practitioner   Contact information:   [address] [place] [address] [phone]       Schedule an appointment as soon as possible for a visit with Charlott Rakes, NP. (As needed)    Specialty:  Adult Health Nurse Practitioner   Contact information:   [address] [place] [address] [phone]       Follow up with Tarri Fuller, PA-C On 02/20/2014. (Appt at 11:30 )    Specialty:  Physician Assistant   Contact information:   [address] [place] [address] [phone]        Disposition: Home, arranged for home health RN, RT, nebulizer machine & medications.  RN arranged to assist with home medication / weight monitoring.    Discharged Condition: Kim [name] has met maximum benefit of inpatient care and is medically stable and cleared for discharge.  Patient is pending follow up as above.      Time spent on disposition:  Greater than 35 minutes.   Signed: Noe Gens, NP-C [facility] Pgr: [phone] Office: [phone]   Physician Statement:   The Patient was personally examined, the discharge assessment and plan has been personally reviewed and I agree with ACNP  assessment and plan. > 30 minutes of time have been dedicated to discharge assessment, planning and discharge instructions.   Rigoberto Noel MD

## 2014-02-10 NOTE — Discharge Instructions (Signed)
1. Stop Smoking! 2. Review your medications carefully as they have changed.  3. Follow up with Pulmonary as scheduled.

## 2014-02-10 NOTE — Progress Notes (Signed)
All d/c instructions explained and given to pt at 1615.  D/C [facility] facility, and picked up by care taker, who Marliss Czar had given d/c instructions to.  Karie Kirks, Therapist, sports.

## 2014-02-12 DIAGNOSIS — Z9981 Dependence on supplemental oxygen: Secondary | ICD-10-CM | POA: Diagnosis not present

## 2014-02-12 DIAGNOSIS — F209 Schizophrenia, unspecified: Secondary | ICD-10-CM | POA: Diagnosis not present

## 2014-02-12 DIAGNOSIS — I5033 Acute on chronic diastolic (congestive) heart failure: Secondary | ICD-10-CM | POA: Diagnosis not present

## 2014-02-12 DIAGNOSIS — J441 Chronic obstructive pulmonary disease with (acute) exacerbation: Secondary | ICD-10-CM | POA: Diagnosis not present

## 2014-02-12 DIAGNOSIS — Z720 Tobacco use: Secondary | ICD-10-CM | POA: Diagnosis not present

## 2014-02-12 DIAGNOSIS — E119 Type 2 diabetes mellitus without complications: Secondary | ICD-10-CM | POA: Diagnosis not present

## 2014-02-17 ENCOUNTER — Ambulatory Visit (INDEPENDENT_AMBULATORY_CARE_PROVIDER_SITE_OTHER): Payer: Medicare Other | Admitting: Adult Health

## 2014-02-17 ENCOUNTER — Encounter: Payer: Self-pay | Admitting: Adult Health

## 2014-02-17 VITALS — BP 126/74 | HR 82 | Temp 98.2°F | Ht 64.0 in | Wt 212.0 lb

## 2014-02-17 DIAGNOSIS — E119 Type 2 diabetes mellitus without complications: Secondary | ICD-10-CM | POA: Diagnosis not present

## 2014-02-17 DIAGNOSIS — Z9981 Dependence on supplemental oxygen: Secondary | ICD-10-CM | POA: Diagnosis not present

## 2014-02-17 DIAGNOSIS — J9600 Acute respiratory failure, unspecified whether with hypoxia or hypercapnia: Secondary | ICD-10-CM | POA: Diagnosis not present

## 2014-02-17 DIAGNOSIS — J441 Chronic obstructive pulmonary disease with (acute) exacerbation: Secondary | ICD-10-CM | POA: Diagnosis not present

## 2014-02-17 DIAGNOSIS — Z720 Tobacco use: Secondary | ICD-10-CM | POA: Diagnosis not present

## 2014-02-17 DIAGNOSIS — I209 Angina pectoris, unspecified: Secondary | ICD-10-CM | POA: Diagnosis not present

## 2014-02-17 DIAGNOSIS — F209 Schizophrenia, unspecified: Secondary | ICD-10-CM | POA: Diagnosis not present

## 2014-02-17 DIAGNOSIS — I5033 Acute on chronic diastolic (congestive) heart failure: Secondary | ICD-10-CM | POA: Diagnosis not present

## 2014-02-17 NOTE — Assessment & Plan Note (Signed)
Recent exacerbation with Acute resp failure /hypercarbia/hypoxic w/ diastolic decompensation  Uncontrolled OSA /noncompliant on CPAP  No sign desats with ambulation in office  Previous PFT w/ min airflow obstruction

## 2014-02-17 NOTE — Progress Notes (Signed)
Subjective:    Patient ID: Kim [name], female    DOB: [birth year], 58 y.o.   MRN: [id number]  HPI PCP - [facility],[name] ANP  5 yowf  schizophrenic smoker from a group home referred for evaluation of pna & effusion  She presented with Left Pleural effusion 07/2008 -Underwent Left thoracentesis x 1050 09/18/2008> ex with 79% lymphs, 16% eos WBC 3610 Last seen 6/10 >> eos 10.5 % with ESR only 17 not specific but this probably represents a late parapneumonic process with organization/ fibrotic change.   She was seen by PCP 06/13/11 , for productive cough, CXR showed rt lung basal opacity & blunting ofleft costophrenic angle - given ceftx IM & levaquin x 7ds, started on spiriva 'Can I have fluid taken off from lungs?' Smokes 10 cigs /day, no phlegm, dyspnea + Continues to hear voices on risperdal 4 mg daily CXR - shows resolved RLL process,left cp angle blunting cold represent pleural thickening, not much layering on decub film  02/17/2014 [facility] follow up  Patient returns for a post hospital followup Patient was admitted September 15 through September 22 for acute hypercarbic respiratory failure with presumed pulmonary edema secondary to  COPD exacerbation and decompensated diastolic heart failure. Patient lives at a group home and presented to the emergency room with worsening shortness, of breath and hypoxemia. Patient required initial BiPAP, but unfortunately, continued to decompensate requiring intubation w/ vent support  2-D echo showed normal systolic function with an ejection fracture 55%, and a grade 1 diastolic dysfunction. She was treated with IV steroids, BD nebs and IV diuresis(neg 7 liters ) .(once b/p  Stabilized).  patient did have some elevated cardiac enzymes and underwent a left heart catheter, which was negative for coronary disease  She was discharged on Lasix 20 mg daily, and a prednisone taper. She finished her prednisone. Discharged on Oxygen at  4l/m .   Has cut back on smoking , advised on smoking cessation  On Spiriva and Ipratropium neb Four times a day   Says previously dx with OSA but says she absolutely can not wear CPAP.     Review of Systems Constitutional:   No  weight loss, night sweats,  Fevers, chills,  +fatigue, or  lassitude.  HEENT:   No headaches,  Difficulty swallowing,  Tooth/dental problems, or  Sore throat,                No sneezing, itching, ear ache, nasal congestion, post nasal drip,   CV:  No chest pain,  Orthopnea, PND,   anasarca, dizziness, palpitations, syncope.   GI  No heartburn, indigestion, abdominal pain, nausea, vomiting, diarrhea, change in bowel habits, loss of appetite, bloody stools.   Resp:    No chest wall deformity  Skin: no rash or lesions.  GU: no dysuria, change in color of urine, no urgency or frequency.  No flank pain, no hematuria   MS:  No joint pain or swelling.  No decreased range of motion.  No back pain.  Psych:  No change in mood or affect. No depression or anxiety.  No memory loss.         Objective:   Physical Exam   Gen. NAD, normal affect ENT - no lesions, no post nasal drip Neck: No JVD, no thyromegaly, no carotid bruits Lungs: no use of accessory muscles, no dullness to percussion, no rhonchi ,  Cardiovascular: Rhythm regular, heart sounds  normal, no murmurs or gallops, tr-1 peripheral edema Abdomen:  soft and non-tender, no hepatosplenomegaly, BS normal. Musculoskeletal: No deformities, no cyanosis or clubbing Neuro:  alert, non focal  Walking o2 sats 94% at rest , walking ~90% on RA      Assessment & Plan:

## 2014-02-17 NOTE — Patient Instructions (Addendum)
Discontinue Atrovent/Ipratropium Neb .  Continue Spiriva 1 puff daily  Keep up good work on quitting smoking  Wear Oxygen 2l/m with activity and At bedtime   follow up Dr. Elsworth Soho  In 4-6 weeks and As needed   Please contact office for sooner follow up if symptoms do not improve or worsen or seek emergency care

## 2014-02-17 NOTE — Assessment & Plan Note (Addendum)
Recent admission with Acute resp failure /hypercarbia/hypoxic w/ diastolic decompensation in setting of probable  Uncontrolled OSA /noncompliant on CPAP  No sign desats with ambulation in office  Previous PFT w/ min airflow obstruction   Plan  Discontinue Atrovent/Ipratropium Neb .  Continue Spiriva 1 puff daily  Keep up good work on quitting smoking  Wear Oxygen 2l/m with activity and At bedtime   follow up Dr. Elsworth Soho  In 4-6 weeks and As needed   Please contact office for sooner follow up if symptoms do not improve or worsen or seek emergency care

## 2014-02-18 NOTE — Progress Notes (Signed)
Reviewed & agree with plan  

## 2014-02-20 ENCOUNTER — Ambulatory Visit (INDEPENDENT_AMBULATORY_CARE_PROVIDER_SITE_OTHER): Payer: Medicare Other | Admitting: Physician Assistant

## 2014-02-20 ENCOUNTER — Encounter: Payer: Self-pay | Admitting: Physician Assistant

## 2014-02-20 VITALS — BP 108/74 | HR 80 | Ht 64.0 in | Wt 215.2 lb

## 2014-02-20 DIAGNOSIS — I5031 Acute diastolic (congestive) heart failure: Secondary | ICD-10-CM | POA: Diagnosis not present

## 2014-02-20 DIAGNOSIS — R5383 Other fatigue: Secondary | ICD-10-CM

## 2014-02-20 DIAGNOSIS — E785 Hyperlipidemia, unspecified: Secondary | ICD-10-CM | POA: Diagnosis not present

## 2014-02-20 DIAGNOSIS — R002 Palpitations: Secondary | ICD-10-CM

## 2014-02-20 DIAGNOSIS — R5381 Other malaise: Secondary | ICD-10-CM | POA: Diagnosis not present

## 2014-02-20 DIAGNOSIS — I208 Other forms of angina pectoris: Secondary | ICD-10-CM | POA: Diagnosis not present

## 2014-02-20 MED ORDER — POTASSIUM CHLORIDE ER 10 MEQ PO TBCR: 10.0000 meq | EXTENDED_RELEASE_TABLET | Freq: Every day | ORAL | Status: DC

## 2014-02-20 NOTE — Assessment & Plan Note (Signed)
Patient does have considerable lower from edema. She does not appear to have any rales on exam. It is hard to tell what her current weight status is given each charted weight was done at a different location.  I've asked her to increase her Lasix to 20 mg twice daily for the next 5 days. She will also take 20 mEq daily and potassium. It didn't go back to 20 mg of Lasix and 10 mEq of potassium as needed for weight gain. Details were written out for the patient. She's also been complaining of some cramping in her left lower extremity. On going to check a basic metabolic panel see how her potassium is doing. Also recheck this next Thursday.  Followup in one month

## 2014-02-20 NOTE — Patient Instructions (Addendum)
Increase lasix to 20mg  twice daily for the next five days.  Continue to monitor weight.  Also take potassium 90meq daily. With the lasix.  Then go back to 20mg  as needed for weight gain of 3 pounds in 24 hours or 5 pounds in a week.  If you take 20mg  of lasix, take 72meq of potassium.  If you take 20mg  of lasix twice daily, take 20 meq of potassium.  Follow up with me in a month.  Labs:  Next week on Thurs:  BMET

## 2014-02-20 NOTE — Progress Notes (Signed)
Date:  02/20/2014   ID:  Kim [name], DOB January 29, 1956, MRN [id number]  PCP:  Salena Saner., MD  Primary Cardiologist: Gwenlyn Found     History of Present Illness: Kim [name] is a 58 y.o. female  with a history of tobacco abuse GERD, COPD, diastolic heart failure, hyperlipidemia, obesity, hypertension, schizophrenia, diabetes mellitus. She is followed by Dr. Elsworth Soho.  Patient was recently hospitalized with acute heart failure and COPD exacerbations. She underwent a left heart catheterization on 02/09/2014 which showed normal coronary arteries.  2-D echocardiogram on 02/04/2014 which showed normal ejection fraction and grade 1 diastolic dysfunction. She presents today with post hospital evaluation. She's been checking her weight daily and this morning was apparently about 204 pounds. Her weight here in the office is 215 however as with all her close and other paraphernalia. She does report lower extremity edema and sleeps elevated. There is no apparent PND.  The patient currently denies nausea, vomiting, fever, chest pain, shortness of breath, orthopnea, dizziness, PND, cough, congestion, abdominal pain, hematochezia, melena, lower extremity edema, claudication.  Wt Readings from Last 3 Encounters:  02/20/14 215 lb 3.2 oz (97.614 kg)  02/17/14 212 lb (96.163 kg)  02/10/14 203 lb 14.8 oz (92.5 kg)     Past Medical History  Diagnosis Date  . GERD (gastroesophageal reflux disease)   . Schizophrenia   . Tobacco abuse   . Hyperlipemia   . Hypercholesteremia   . CHF (congestive heart failure)   . Diabetes mellitus without complication     Current Outpatient Prescriptions  Medication Sig Dispense Refill  . albuterol (PROVENTIL HFA;VENTOLIN HFA) 108 (90 BASE) MCG/ACT inhaler Inhale 2 puffs into the lungs every 6 (six) hours as needed for shortness of breath (Use as first line for wheezing / shortness of breath.).  1 Inhaler  3  . cholecalciferol (VITAMIN D) 400 UNITS TABS Take 400 Units  by mouth daily.      . furosemide (LASIX) 20 MG tablet Take 1 tablet (20 mg total) by mouth daily as needed for fluid.  30 tablet  3  . ipratropium (ATROVENT) 0.02 % nebulizer solution Take 0.5 mg by nebulization 4 (four) times daily.      . metFORMIN (GLUCOPHAGE-XR) 750 MG 24 hr tablet Take 750 mg by mouth daily with breakfast.      . OXYGEN Inhale 2 L into the lungs as needed (shortness of breath with walking).      . risperiDONE (RISPERDAL) 2 MG tablet Take 2 mg by mouth at bedtime.      . rosuvastatin (CRESTOR) 5 MG tablet Take 5 mg by mouth daily.      [name] tiotropium (SPIRIVA HANDIHALER) 18 MCG inhalation capsule Place 1 capsule (18 mcg total) into inhaler and inhale daily.  30 capsule  3   No current facility-administered medications for this visit.    Allergies:    Allergies  Allergen Reactions  . Penicillins Nausea And Vomiting    Large doses  . Quetiapine     REACTION: nausea/dizzy    Social History:  The patient  reports that she has been smoking Cigarettes.  She has a 52.5 pack-year smoking history. She has never used smokeless tobacco. She reports that she does not drink alcohol or use illicit drugs.   Family history:  No family history on file.  ROS:  Please see the history of present illness.  All other systems reviewed and negative.   PHYSICAL EXAM: VS:  BP 108/74  Pulse 80  Ht 5\' 4"  (1.626 m)  Wt 215 lb 3.2 oz (97.614 kg)  BMI 36.92 kg/m2 Well nourished, well developed, in no acute distress HEENT: Pupils are equal round react to light accommodation extraocular movements are intact.  Neck: no JVDNo cervical lymphadenopathy. Cardiac: Regular rate and rhythm without murmurs rubs or gallops. Lungs:  clear to auscultation bilaterally,mild wheeze on the right Abd: soft, nontender, positive bowel sounds all quadrants, no hepatosplenomegaly Ext: 2+ lower extremity edema.  2+ radial and dorsalis pedis pulses. Skin: warm and dry Neuro:  Grossly normal  EKG:  NSR 80  beats per minute     ASSESSMENT AND PLAN:  Problem List Items Addressed This Visit   None

## 2014-02-20 NOTE — Assessment & Plan Note (Signed)
Blood pressure well controlled

## 2014-02-20 NOTE — Assessment & Plan Note (Signed)
On statin.

## 2014-02-21 LAB — BASIC METABOLIC PANEL
BUN: 10 mg/dL (ref 6–23)
CALCIUM: 9.6 mg/dL (ref 8.4–10.5)
CO2: 33 meq/L — AB (ref 19–32)
CREATININE: 0.65 mg/dL (ref 0.50–1.10)
Chloride: 94 mEq/L — ABNORMAL LOW (ref 96–112)
GLUCOSE: 78 mg/dL (ref 70–99)
Potassium: 4.2 mEq/L (ref 3.5–5.3)
SODIUM: 133 meq/L — AB (ref 135–145)

## 2014-02-23 ENCOUNTER — Telehealth: Payer: Self-pay | Admitting: Physician Assistant

## 2014-02-23 NOTE — Telephone Encounter (Signed)
I spoke to Kim [name] on the phone and gave her the lab results.  Weight decreased another 2 pounds to 202.  Still with LEE.  I instructed her to increase lasix again to BID for three more days with increased potassium.   Tarri Fuller PAC

## 2014-02-24 ENCOUNTER — Telehealth: Payer: Self-pay | Admitting: Physician Assistant

## 2014-02-24 NOTE — Telephone Encounter (Signed)
Pt was suppose to have lab work last Thursday.She wants to know if she can have it today or tomorrow?

## 2014-02-24 NOTE — Telephone Encounter (Signed)
Left message on voicemail  Yes , can have lab work done today or tomorrow - does not need to be fasting.  Any question may call back

## 2014-02-26 DIAGNOSIS — E119 Type 2 diabetes mellitus without complications: Secondary | ICD-10-CM | POA: Diagnosis not present

## 2014-02-26 DIAGNOSIS — Z9981 Dependence on supplemental oxygen: Secondary | ICD-10-CM | POA: Diagnosis not present

## 2014-02-26 DIAGNOSIS — J441 Chronic obstructive pulmonary disease with (acute) exacerbation: Secondary | ICD-10-CM | POA: Diagnosis not present

## 2014-02-26 DIAGNOSIS — F209 Schizophrenia, unspecified: Secondary | ICD-10-CM | POA: Diagnosis not present

## 2014-02-26 DIAGNOSIS — I5033 Acute on chronic diastolic (congestive) heart failure: Secondary | ICD-10-CM | POA: Diagnosis not present

## 2014-02-26 DIAGNOSIS — Z720 Tobacco use: Secondary | ICD-10-CM | POA: Diagnosis not present

## 2014-03-02 DIAGNOSIS — F209 Schizophrenia, unspecified: Secondary | ICD-10-CM | POA: Diagnosis not present

## 2014-03-11 DIAGNOSIS — E119 Type 2 diabetes mellitus without complications: Secondary | ICD-10-CM | POA: Diagnosis not present

## 2014-03-11 DIAGNOSIS — Z720 Tobacco use: Secondary | ICD-10-CM | POA: Diagnosis not present

## 2014-03-11 DIAGNOSIS — F209 Schizophrenia, unspecified: Secondary | ICD-10-CM | POA: Diagnosis not present

## 2014-03-11 DIAGNOSIS — J441 Chronic obstructive pulmonary disease with (acute) exacerbation: Secondary | ICD-10-CM | POA: Diagnosis not present

## 2014-03-11 DIAGNOSIS — I5033 Acute on chronic diastolic (congestive) heart failure: Secondary | ICD-10-CM | POA: Diagnosis not present

## 2014-03-11 DIAGNOSIS — Z9981 Dependence on supplemental oxygen: Secondary | ICD-10-CM | POA: Diagnosis not present

## 2014-03-13 DIAGNOSIS — R600 Localized edema: Secondary | ICD-10-CM | POA: Diagnosis not present

## 2014-03-13 DIAGNOSIS — F17200 Nicotine dependence, unspecified, uncomplicated: Secondary | ICD-10-CM | POA: Diagnosis not present

## 2014-03-13 DIAGNOSIS — E119 Type 2 diabetes mellitus without complications: Secondary | ICD-10-CM | POA: Diagnosis not present

## 2014-03-13 DIAGNOSIS — Z79899 Other long term (current) drug therapy: Secondary | ICD-10-CM | POA: Diagnosis not present

## 2014-03-18 ENCOUNTER — Ambulatory Visit (INDEPENDENT_AMBULATORY_CARE_PROVIDER_SITE_OTHER): Payer: Medicare Other | Admitting: Physician Assistant

## 2014-03-18 ENCOUNTER — Encounter: Payer: Self-pay | Admitting: Physician Assistant

## 2014-03-18 VITALS — BP 100/80 | HR 64 | Ht 64.5 in | Wt 210.6 lb

## 2014-03-18 DIAGNOSIS — I208 Other forms of angina pectoris: Secondary | ICD-10-CM | POA: Diagnosis not present

## 2014-03-18 DIAGNOSIS — I950 Idiopathic hypotension: Secondary | ICD-10-CM | POA: Diagnosis not present

## 2014-03-18 DIAGNOSIS — I82439 Acute embolism and thrombosis of unspecified popliteal vein: Secondary | ICD-10-CM | POA: Insufficient documentation

## 2014-03-18 DIAGNOSIS — R600 Localized edema: Secondary | ICD-10-CM | POA: Diagnosis not present

## 2014-03-18 DIAGNOSIS — Z720 Tobacco use: Secondary | ICD-10-CM | POA: Diagnosis not present

## 2014-03-18 DIAGNOSIS — I5032 Chronic diastolic (congestive) heart failure: Secondary | ICD-10-CM | POA: Insufficient documentation

## 2014-03-18 DIAGNOSIS — I5033 Acute on chronic diastolic (congestive) heart failure: Secondary | ICD-10-CM

## 2014-03-18 NOTE — Assessment & Plan Note (Addendum)
BP stable.  Not on any BP meds.

## 2014-03-18 NOTE — Assessment & Plan Note (Signed)
Cessation discussed 

## 2014-03-18 NOTE — Progress Notes (Signed)
Date:  03/18/2014   ID:  Kim [name], DOB Mar 06, 1956, MRN [id number]  PCP:  Salena Saner., MD  Primary Cardiologist:  Gwenlyn Found    History of Present Illness: Kim [name] is a 58 y.o. female  Kim [name] is a 58 y.o. female with a history of tobacco abuse GERD, COPD, diastolic heart failure, hyperlipidemia, obesity, hypertension, schizophrenia, diabetes mellitus. She is followed by Dr. Elsworth Soho. Patient was recently hospitalized with acute heart failure and COPD exacerbations. She underwent a left heart catheterization on 02/09/2014 which showed normal coronary arteries. 2-D echocardiogram on 02/04/2014 which showed normal ejection fraction and grade 1 diastolic dysfunction. I saw her earlier in the month and she weight 215 pounds. She presents today with complaints of left lower extremity edema. She was seen by her primary care provider who increased her Lasix for 7 days which she is currently in the middle of taking. She does sleep elevated but states this is because she snores and is supposed to wear CPAP. She previously had a sleep study about 2 years ago does not use the CPAP machine because it made her sick.  The patient currently denies nausea, vomiting, fever, chest pain, shortness of breath, orthopnea, dizziness, PND, cough, congestion, abdominal pain, hematochezia, melena.  Wt Readings from Last 3 Encounters:  03/18/14 210 lb 9.6 oz (95.528 kg)  02/20/14 215 lb 3.2 oz (97.614 kg)  02/17/14 212 lb (96.163 kg)     Past Medical History  Diagnosis Date  . GERD (gastroesophageal reflux disease)   . Schizophrenia   . Tobacco abuse   . Hyperlipemia   . Hypercholesteremia   . CHF (congestive heart failure)   . Diabetes mellitus without complication     Current Outpatient Prescriptions  Medication Sig Dispense Refill  . albuterol (PROVENTIL HFA;VENTOLIN HFA) 108 (90 BASE) MCG/ACT inhaler Inhale 2 puffs into the lungs every 6 (six) hours as needed for shortness of breath  (Use as first line for wheezing / shortness of breath.).  1 Inhaler  3  . cholecalciferol (VITAMIN D) 400 UNITS TABS Take 400 Units by mouth daily.      . furosemide (LASIX) 20 MG tablet Take 1 tablet (20 mg total) by mouth daily as needed for fluid.  30 tablet  3  . ipratropium (ATROVENT) 0.02 % nebulizer solution Take 0.5 mg by nebulization 4 (four) times daily.      . metFORMIN (GLUCOPHAGE-XR) 750 MG 24 hr tablet Take 750 mg by mouth daily with breakfast.      . OXYGEN Inhale 2 L into the lungs as needed (shortness of breath with walking).      . potassium chloride (K-DUR) 10 MEQ tablet Take 1 tablet (10 mEq total) by mouth daily. May take an extra potassium as needed.  45 tablet  11  . risperiDONE (RISPERDAL) 2 MG tablet Take 2 mg by mouth at bedtime.      . rosuvastatin (CRESTOR) 5 MG tablet Take 5 mg by mouth daily.      [name] tiotropium (SPIRIVA HANDIHALER) 18 MCG inhalation capsule Place 1 capsule (18 mcg total) into inhaler and inhale daily.  30 capsule  3   No current facility-administered medications for this visit.    Allergies:    Allergies  Allergen Reactions  . Penicillins Nausea And Vomiting    Large doses  . Quetiapine     REACTION: nausea/dizzy  . Acetaminophen Nausea And Vomiting and Rash    Social History:  The patient  reports that she has been smoking Cigarettes.  She has a 52.5 pack-year smoking history. She has never used smokeless tobacco. She reports that she does not drink alcohol or use illicit drugs.   Family history:  History reviewed. No pertinent family history.  ROS:  Please see the history of present illness.  All other systems reviewed and negative.   PHYSICAL EXAM: VS:  BP 100/80  Pulse 64  Ht 5' 4.5" (1.638 m)  Wt 210 lb 9.6 oz (95.528 kg)  BMI 35.60 kg/m2 Well nourished, well developed, in no acute distress HEENT: Pupils are equal round react to light accommodation extraocular movements are intact.  Neck: no JVDNo cervical  lymphadenopathy. Cardiac: Regular rate and rhythm without murmurs rubs or gallops. Lungs:  bibasilar rales.  No wheezing Abdomen: soft, nontender, positive bowel sounds all quadrants, no hepatosplenomegaly Ext:  2+ left lower extremity edema, trace on the right.  2+ radial and dorsalis pedis pulses. Skin: warm and dry Neuro:  Grossly normal  EKG: none   ASSESSMENT AND PLAN:  Problem List Items Addressed This Visit   Acute on chronic diastolic heart failure     Patient's weight is down 5 pounds from her previous visit. It appears her lower extremity edema has improved with the exception of the left leg. Her primary care providers prescribed an increased dosing of Lasix for 7 days and she is in the middle of that currently. She does have some rales on exam. We discussed increasing exercise routine to help facilitate return of blood flow to her heart. However follow-up in 1 month see how she's doing.    Edema of left lower extremity     Left lower extremity appears more swollen than the right. Is also tense and mildly erythematous.  I will check a lower extremity venous Doppler to assess for blood clot.    Hypotension     BP stable.  Not on any BP meds.    Tobacco abuse     Cessation discussed     Other Visit Diagnoses   Leg edema, left    -  Primary    Relevant Orders       Lower Extremity Venous Duplex Left

## 2014-03-18 NOTE — Assessment & Plan Note (Signed)
Left lower extremity appears more swollen than the right. Is also tense and mildly erythematous.  I will check a lower extremity venous Doppler to assess for blood clot.

## 2014-03-18 NOTE — Assessment & Plan Note (Signed)
Patient's weight is down 5 pounds from her previous visit. It appears her lower extremity edema has improved with the exception of the left leg. Her primary care providers prescribed an increased dosing of Lasix for 7 days and she is in the middle of that currently. She does have some rales on exam. We discussed increasing exercise routine to help facilitate return of blood flow to her heart. However follow-up in 1 month see how she's doing.

## 2014-03-18 NOTE — Patient Instructions (Signed)
Your physician has requested that you have a left lower extremity venous duplex. This test is an ultrasound of the veins in the legs. It looks at venous blood flow that carries blood from the heart to the legs. Allow one hour for a Lower Venous exam. There are no restrictions or special instructions.  Please follow up with Tarri Fuller, PA-C in 1 month.

## 2014-03-24 ENCOUNTER — Ambulatory Visit (HOSPITAL_COMMUNITY)
Admission: RE | Admit: 2014-03-24 | Discharge: 2014-03-24 | Disposition: A | Discharge status: Home / Self Care | Payer: Medicare Other | Source: Ambulatory Visit | Attending: Cardiovascular Disease | Admitting: Cardiovascular Disease

## 2014-03-24 DIAGNOSIS — R600 Localized edema: Secondary | ICD-10-CM

## 2014-03-26 DIAGNOSIS — Z9981 Dependence on supplemental oxygen: Secondary | ICD-10-CM | POA: Diagnosis not present

## 2014-03-26 DIAGNOSIS — Z720 Tobacco use: Secondary | ICD-10-CM | POA: Diagnosis not present

## 2014-03-26 DIAGNOSIS — J441 Chronic obstructive pulmonary disease with (acute) exacerbation: Secondary | ICD-10-CM | POA: Diagnosis not present

## 2014-03-26 DIAGNOSIS — F209 Schizophrenia, unspecified: Secondary | ICD-10-CM | POA: Diagnosis not present

## 2014-03-26 DIAGNOSIS — E119 Type 2 diabetes mellitus without complications: Secondary | ICD-10-CM | POA: Diagnosis not present

## 2014-03-26 DIAGNOSIS — I5033 Acute on chronic diastolic (congestive) heart failure: Secondary | ICD-10-CM | POA: Diagnosis not present

## 2014-03-30 ENCOUNTER — Telehealth (HOSPITAL_COMMUNITY): Payer: Self-pay | Admitting: *Deleted

## 2014-04-01 ENCOUNTER — Ambulatory Visit (HOSPITAL_COMMUNITY)
Admission: RE | Admit: 2014-04-01 | Discharge: 2014-04-01 | Disposition: A | Discharge status: Home / Self Care | Payer: Medicare Other | Source: Ambulatory Visit | Attending: Cardiovascular Disease | Admitting: Cardiovascular Disease

## 2014-04-01 ENCOUNTER — Other Ambulatory Visit: Payer: Self-pay | Admitting: Physician Assistant

## 2014-04-01 ENCOUNTER — Other Ambulatory Visit (HOSPITAL_COMMUNITY): Payer: Self-pay | Admitting: Physician Assistant

## 2014-04-01 DIAGNOSIS — M7989 Other specified soft tissue disorders: Secondary | ICD-10-CM | POA: Diagnosis not present

## 2014-04-01 DIAGNOSIS — I82402 Acute embolism and thrombosis of unspecified deep veins of left lower extremity: Secondary | ICD-10-CM

## 2014-04-01 MED ORDER — RIVAROXABAN (XARELTO) VTE STARTER PACK (15 & 20 MG): ORAL_TABLET | ORAL | Status: DC

## 2014-04-01 MED ORDER — RIVAROXABAN 20 MG PO TABS: 20.0000 mg | ORAL_TABLET | Freq: Every day | ORAL | Status: DC

## 2014-04-01 NOTE — Progress Notes (Signed)
Left lower extremity venous US was positive for popliteal and posterior tibial DVT.  I have started her on Xarelto starter pack then 20mg  once daily thereafter.  Repeat dopplers in 3 months.  Tarri Fuller PAC

## 2014-04-01 NOTE — Progress Notes (Signed)
Left Lower Extremity Venous Duplex Completed. °Brianna L Mazza,RVT °

## 2014-04-06 ENCOUNTER — Encounter: Payer: Self-pay | Admitting: Physician Assistant

## 2014-04-06 NOTE — Progress Notes (Signed)
The patient had lower extremity venous dopplers that were positive for DVT on the left.  I started her on Xarelto.   [place], PAC

## 2014-04-07 ENCOUNTER — Ambulatory Visit (INDEPENDENT_AMBULATORY_CARE_PROVIDER_SITE_OTHER): Payer: Medicare Other | Admitting: Pulmonary Disease

## 2014-04-07 ENCOUNTER — Encounter: Payer: Self-pay | Admitting: Pulmonary Disease

## 2014-04-07 VITALS — BP 104/72 | HR 65 | Ht 61.0 in | Wt 209.2 lb

## 2014-04-07 DIAGNOSIS — J432 Centrilobular emphysema: Secondary | ICD-10-CM | POA: Diagnosis not present

## 2014-04-07 DIAGNOSIS — I82432 Acute embolism and thrombosis of left popliteal vein: Secondary | ICD-10-CM

## 2014-04-07 DIAGNOSIS — I208 Other forms of angina pectoris: Secondary | ICD-10-CM

## 2014-04-07 DIAGNOSIS — J449 Chronic obstructive pulmonary disease, unspecified: Secondary | ICD-10-CM | POA: Diagnosis not present

## 2014-04-07 DIAGNOSIS — J9602 Acute respiratory failure with hypercapnia: Secondary | ICD-10-CM | POA: Diagnosis not present

## 2014-04-07 DIAGNOSIS — R05 Cough: Secondary | ICD-10-CM

## 2014-04-07 DIAGNOSIS — R059 Cough, unspecified: Secondary | ICD-10-CM

## 2014-04-07 NOTE — Assessment & Plan Note (Signed)
We will check oxygen level during sleep  OK to dc daytime oxygen You have to QUIT smoking !! Lung function is dropping - stay on spiriva

## 2014-04-07 NOTE — Assessment & Plan Note (Signed)
Stay on rivaroxaban x 6 mnths

## 2014-04-07 NOTE — Addendum Note (Signed)
Addended by: Mathis Dad on: 04/07/2014 03:18 PM   Modules accepted: Orders

## 2014-04-07 NOTE — Patient Instructions (Addendum)
We will check oxygen level during sleep  OK to dc daytime oxygen You have to QUIT smoking !! Lung function is dropping - stay on spiriva  Stay on rivaroxaban x 6 mnths

## 2014-04-07 NOTE — Progress Notes (Signed)
   Subjective:    Patient ID: Kim [name], female    DOB: 09-29-1955, 58 y.o.   MRN: [id number]  HPI  [facility] - [facility],[name] ANP  58 yowf schizophrenic smoker from a group home  for FU of COPd & dCHF   07/2008 Left Pleural effusion - -Underwent Left thoracentesis x 1050 >> ex with 79% lymphs, 16% eos WBC 3610    04/07/2014  Chief Complaint  Patient presents with  . Follow-up    started smoking again, coughing; no complaints   Back to smoking Not using oxygen  Admitted Sep 2015 for acute hypercarbic respiratory failure with presumed pulmonary edema secondary to COPD exacerbation and decompensated diastolic heart failure. She required initial BiPAP, but unfortunately, continued to decompensate requiring intubation w/ vent support  2-D echo showed normal systolic function with an ejection fracture 55%, and a grade 1 diastolic dysfunction. She was treated with IV steroids, BD nebs and IV diuresis(neg 7 liters ) .(once b/p Stabilized). patient did have some elevated cardiac enzymes and underwent a left heart cath, which was negative for coronary disease  She was discharged on Lasix 20 mg daily, and a prednisone taper. prednisone. Discharged on Oxygen at 4l/m .  On Spiriva  Says previously dx with OSA but says she absolutely can not wear CPAP.   Developed LLE swelling -Duplex 03/2014 LLE DVT Spirometry FEV1 69%, FVC 78%, ratio 71 Did not desaturate on walking  Past Medical History  Diagnosis Date  . GERD (gastroesophageal reflux disease)   . Schizophrenia   . Tobacco abuse   . Hyperlipemia   . Hypercholesteremia   . CHF (congestive heart failure)   . Diabetes mellitus without complication      Review of Systems neg for any significant sore throat, dysphagia, itching, sneezing, nasal congestion or excess/ purulent secretions, fever, chills, sweats, unintended wt loss, pleuritic or exertional cp, hempoptysis, orthopnea pnd or change in chronic leg  swelling. Also denies presyncope, palpitations, heartburn, abdominal pain, nausea, vomiting, diarrhea or change in bowel or urinary habits, dysuria,hematuria, rash, arthralgias, visual complaints, headache, numbness weakness or ataxia.     Objective:   Physical Exam  Gen. Pleasant, obese, in no distress ENT - no lesions, no post nasal drip Neck: No JVD, no thyromegaly, no carotid bruits Lungs: no use of accessory muscles, no dullness to percussion, decreased without rales or rhonchi  Cardiovascular: Rhythm regular, heart sounds  normal, no murmurs or gallops, no peripheral edema Musculoskeletal: No deformities, no cyanosis or clubbing , no tremors       Assessment & Plan:

## 2014-04-09 DIAGNOSIS — Z9981 Dependence on supplemental oxygen: Secondary | ICD-10-CM | POA: Diagnosis not present

## 2014-04-09 DIAGNOSIS — E119 Type 2 diabetes mellitus without complications: Secondary | ICD-10-CM | POA: Diagnosis not present

## 2014-04-09 DIAGNOSIS — Z720 Tobacco use: Secondary | ICD-10-CM | POA: Diagnosis not present

## 2014-04-09 DIAGNOSIS — F209 Schizophrenia, unspecified: Secondary | ICD-10-CM | POA: Diagnosis not present

## 2014-04-09 DIAGNOSIS — I5033 Acute on chronic diastolic (congestive) heart failure: Secondary | ICD-10-CM | POA: Diagnosis not present

## 2014-04-09 DIAGNOSIS — J441 Chronic obstructive pulmonary disease with (acute) exacerbation: Secondary | ICD-10-CM | POA: Diagnosis not present

## 2014-04-10 ENCOUNTER — Telehealth: Payer: Self-pay | Admitting: Pulmonary Disease

## 2014-04-10 NOTE — Telephone Encounter (Signed)
Will forward to RA as FYI 

## 2014-04-15 ENCOUNTER — Ambulatory Visit: Payer: Medicare Other | Admitting: Physician Assistant

## 2014-04-30 ENCOUNTER — Encounter (HOSPITAL_COMMUNITY): Payer: Self-pay | Admitting: Cardiovascular Disease

## 2014-05-05 ENCOUNTER — Ambulatory Visit (INDEPENDENT_AMBULATORY_CARE_PROVIDER_SITE_OTHER): Payer: Medicare Other | Admitting: Physician Assistant

## 2014-05-05 ENCOUNTER — Encounter: Payer: Self-pay | Admitting: Physician Assistant

## 2014-05-05 VITALS — BP 130/86 | HR 64 | Ht 64.5 in | Wt 199.2 lb

## 2014-05-05 DIAGNOSIS — Z720 Tobacco use: Secondary | ICD-10-CM | POA: Diagnosis not present

## 2014-05-05 DIAGNOSIS — E785 Hyperlipidemia, unspecified: Secondary | ICD-10-CM

## 2014-05-05 DIAGNOSIS — I2510 Atherosclerotic heart disease of native coronary artery without angina pectoris: Secondary | ICD-10-CM | POA: Diagnosis not present

## 2014-05-05 DIAGNOSIS — I82402 Acute embolism and thrombosis of unspecified deep veins of left lower extremity: Secondary | ICD-10-CM

## 2014-05-05 DIAGNOSIS — I5033 Acute on chronic diastolic (congestive) heart failure: Secondary | ICD-10-CM

## 2014-05-05 DIAGNOSIS — I82432 Acute embolism and thrombosis of left popliteal vein: Secondary | ICD-10-CM | POA: Diagnosis not present

## 2014-05-05 DIAGNOSIS — I208 Other forms of angina pectoris: Secondary | ICD-10-CM | POA: Diagnosis not present

## 2014-05-05 NOTE — Assessment & Plan Note (Signed)
Continues have 2+ lower extremity edema in the left leg. She is on Xarelto. We'll recheck lower extremity venous Doppler in February which will be 3 months.

## 2014-05-05 NOTE — Assessment & Plan Note (Signed)
No complaints of chest pain

## 2014-05-05 NOTE — Assessment & Plan Note (Signed)
Continue statin. 

## 2014-05-05 NOTE — Assessment & Plan Note (Signed)
Tobacco cessation discussed 

## 2014-05-05 NOTE — Assessment & Plan Note (Signed)
Patient's weight has decreased 10 pounds since her last office visit.  current diuretic therapy.

## 2014-05-05 NOTE — Patient Instructions (Signed)
Your physician has requested that you have a lower or upper extremity venous duplex. This test is an ultrasound of the veins in the legs or arms. It looks at venous blood flow that carries blood from the heart to the legs or arms. Allow one hour for a Lower Venous exam. Allow thirty minutes for an Upper Venous exam. There are no restrictions or special instructions.   Your physician wants you to follow-up in: 6 months with Dr.Berry. You will receive a reminder letter in the mail two months in advance. If you don't receive a letter, please call our office to schedule the follow-up appointment.

## 2014-05-05 NOTE — Progress Notes (Signed)
Patient ID: Kim [name], female   DOB: [birth year], 58 y.o.   MRN: [id number]    Date:  05/05/2014   ID:  Kim [name], DOB 12-Sep-1955, MRN [id number]  PCP:  Salena Saner., MD  Primary Cardiologist:  Kim [name]     History of Present Illness: Kim [name] is a 58 y.o. female Kim [name] is a 58 y.o. female with a history of tobacco abuse, GERD, COPD, diastolic heart failure, hyperlipidemia, obesity, hypertension, schizophrenia, diabetes mellitus. She is followed by Dr. Elsworth Soho. Patient was recently hospitalized with acute heart failure and COPD exacerbations. She underwent a left heart catheterization on 02/09/2014 which showed normal coronary arteries. 2-D echocardiogram on 02/04/2014 which showed normal ejection fraction and grade 1 diastolic dysfunction. I saw her I can October and she weight 215 pounds. She does sleep elevated but states this is because she snores and is supposed to wear CPAP. She previously had a sleep study about 2 years ago does not use the CPAP machine because it made her sick.  I ordered lower extremity venous Dopplers which were completed on 04/01/2014 and showed left popliteal and posterior tibialis DVTs. She was started on Xarelto.  Patient presents today for follow-up evaluation. 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  She continues to smoke. Her left lower extremity is still swollen.  Her weight has decreased 10 pounds from her prior office visit.   She denies nausea, vomiting, fever, chest pain, shortness of breath beyond baseline, orthopnea, dizziness, PND, cough, congestion, abdominal pain, hematochezia, melena, lower extremity edema, claudication.  Wt Readings from Last 3 Encounters:  05/05/14 199 lb 3.2 oz (90.357 kg)  04/07/14 209 lb 3.2 oz (94.892 kg)  03/18/14 210 lb 9.6 oz (95.528 kg)     Past Medical History  Diagnosis Date  . GERD (gastroesophageal reflux disease)   . Schizophrenia   . Tobacco abuse   .  Hyperlipemia   . Hypercholesteremia   . CHF (congestive heart failure)   . Diabetes mellitus without complication     Current Outpatient Prescriptions  Medication Sig Dispense Refill  . albuterol (PROVENTIL HFA;VENTOLIN HFA) 108 (90 BASE) MCG/ACT inhaler Inhale 2 puffs into the lungs every 6 (six) hours as needed for shortness of breath (Use as first line for wheezing / shortness of breath.). 1 Inhaler 3  . cholecalciferol (VITAMIN D) 400 UNITS TABS Take 400 Units by mouth daily.    . furosemide (LASIX) 20 MG tablet Take 1 tablet (20 mg total) by mouth daily as needed for fluid. 30 tablet 3  . ipratropium (ATROVENT) 0.02 % nebulizer solution Take 0.5 mg by nebulization 4 (four) times daily.    . metFORMIN (GLUCOPHAGE-XR) 750 MG 24 hr tablet Take 750 mg by mouth daily with breakfast.    . OXYGEN Inhale 2 L into the lungs as needed (shortness of breath with walking).    . potassium chloride (K-DUR) 10 MEQ tablet Take 1 tablet (10 mEq total) by mouth daily. May take an extra potassium as needed. 45 tablet 11  . risperiDONE (RISPERDAL) 2 MG tablet Take 2 mg by mouth at bedtime.    . rivaroxaban (XARELTO) 20 MG TABS tablet Take 1 tablet (20 mg total) by mouth daily with supper. 30 tablet 11  . rosuvastatin (CRESTOR) 5 MG tablet Take 5 mg by mouth daily.    [name] tiotropium (SPIRIVA HANDIHALER) 18 MCG inhalation capsule Place 1 capsule (18 mcg total) into inhaler and inhale  daily. 30 capsule 3   No current facility-administered medications for this visit.    Allergies:    Allergies  Allergen Reactions  . Penicillins Nausea And Vomiting    Large doses  . Quetiapine     REACTION: nausea/dizzy  . Acetaminophen Nausea And Vomiting and Rash    Social History:  The patient  reports that she has been smoking Cigarettes.  She has a 52.5 pack-year smoking history. She has never used smokeless tobacco. She reports that she does not drink alcohol or use illicit drugs.   Family history:  No family  history on file.  ROS:  Please see the history of present illness.  All other systems reviewed and negative.   PHYSICAL EXAM: VS:  BP 130/86 mmHg  Pulse 64  Ht 5' 4.5" (1.638 m)  Wt 199 lb 3.2 oz (90.357 kg)  BMI 33.68 kg/m2 Obese, well developed, in no acute distress HEENT: Pupils are equal round react to light accommodation extraocular movements are intact.  Neck: no JVD.No cervical lymphadenopathy. Cardiac: Regular rate and rhythm without murmurs rubs or gallops. Lungs:  clear to auscultation bilaterally, no wheezing, rhonchi or rales Ext: 2+ left and a trace right lower extremity edema.  2+ radial and dorsalis pedis pulses. Skin: warm and dry Neuro:  Grossly normal    ASSESSMENT AND PLAN:  Problem List Items Addressed This Visit    Acute on chronic diastolic heart failure    Patient's weight has decreased 10 pounds since her last office visit.  current diuretic therapy.    DVT, popliteal, acute    Continues have 2+ lower extremity edema in the left leg. She is on Xarelto. We'll recheck lower extremity venous Doppler in February which will be 3 months.    Hyperlipidemia    Continue statin       Tobacco abuse    Tobacco cessation discussed     Other Visit Diagnoses    DVT (deep venous thrombosis), left    -  Primary

## 2014-05-08 ENCOUNTER — Ambulatory Visit (HOSPITAL_COMMUNITY)
Admission: RE | Admit: 2014-05-08 | Discharge: 2014-05-08 | Disposition: A | Discharge status: Home / Self Care | Payer: Medicare Other | Source: Ambulatory Visit | Attending: Cardiology | Admitting: Cardiology

## 2014-05-08 DIAGNOSIS — I82402 Acute embolism and thrombosis of unspecified deep veins of left lower extremity: Secondary | ICD-10-CM | POA: Insufficient documentation

## 2014-05-08 NOTE — Progress Notes (Signed)
Left Lower Extremity Venous Duplex Completed. °Brianna L Mazza,RVT °

## 2014-06-10 DIAGNOSIS — F209 Schizophrenia, unspecified: Secondary | ICD-10-CM | POA: Diagnosis not present

## 2014-06-17 ENCOUNTER — Telehealth: Payer: Self-pay | Admitting: Physician Assistant

## 2014-06-17 NOTE — Telephone Encounter (Signed)
Spoke with Kim [name]. Appears as if Dr. Elsworth Soho (pulmonogist) has been handling patient's home O2. Advised Lauren to contact their office or have [place] contact them.

## 2014-06-17 NOTE — Telephone Encounter (Signed)
Lauren called in to follow up on a request that has been faxed to our office about getting the pt some oxygen. She spoke with [facility] and they told her that they faxed over a refill request twice but had not received a response. The pt is in need of her oxygen. Please f/u with Lauren.  Thanks

## 2014-06-17 NOTE — Telephone Encounter (Signed)
Spoke with Curt Bears, RN Certificate of Medical Necessity of Home O2 was signed and faxed to [facility]  [phone]  LM for Lauren informing her of this.

## 2014-06-19 ENCOUNTER — Telehealth: Payer: Self-pay | Admitting: Pulmonary Disease

## 2014-06-19 DIAGNOSIS — E876 Hypokalemia: Secondary | ICD-10-CM | POA: Diagnosis not present

## 2014-06-19 DIAGNOSIS — E1165 Type 2 diabetes mellitus with hyperglycemia: Secondary | ICD-10-CM | POA: Diagnosis not present

## 2014-06-19 DIAGNOSIS — E782 Mixed hyperlipidemia: Secondary | ICD-10-CM | POA: Diagnosis not present

## 2014-06-19 DIAGNOSIS — I10 Essential (primary) hypertension: Secondary | ICD-10-CM | POA: Diagnosis not present

## 2014-06-19 NOTE — Telephone Encounter (Signed)
Spoke with pt care giver. She reports pt needs refill on lasix. Pt last had refill from Kim [name] 02/10/14 #30 x 3 refills. QD PRN. Per Ander Purpura pt takes it once daily and at times can take twice a day if she gains 6lbs. Please advise RA if okay to refill thanks

## 2014-06-22 MED ORDER — FUROSEMIDE 20 MG PO TABS: ORAL_TABLET | ORAL | Status: DC

## 2014-06-22 NOTE — Telephone Encounter (Signed)
RX has been called in. Nothing further needed 

## 2014-06-22 NOTE — Telephone Encounter (Signed)
OK to refill

## 2014-06-29 ENCOUNTER — Other Ambulatory Visit: Payer: Self-pay | Admitting: *Deleted

## 2014-06-29 MED ORDER — TIOTROPIUM BROMIDE MONOHYDRATE 18 MCG IN CAPS: 18.0000 ug | ORAL_CAPSULE | Freq: Every day | RESPIRATORY_TRACT | Status: DC

## 2014-06-29 NOTE — Telephone Encounter (Signed)
Refilled Spiriva for 30 day supply. Pt scheduled to see TP on 07/09/14.  Nothing further needed.

## 2014-07-09 ENCOUNTER — Encounter: Payer: Self-pay | Admitting: Adult Health

## 2014-07-09 ENCOUNTER — Encounter (INDEPENDENT_AMBULATORY_CARE_PROVIDER_SITE_OTHER): Payer: Self-pay

## 2014-07-09 ENCOUNTER — Ambulatory Visit (INDEPENDENT_AMBULATORY_CARE_PROVIDER_SITE_OTHER): Payer: Medicare Other | Admitting: Adult Health

## 2014-07-09 VITALS — BP 118/74 | HR 63 | Temp 98.2°F | Ht 64.0 in | Wt 198.0 lb

## 2014-07-09 DIAGNOSIS — J432 Centrilobular emphysema: Secondary | ICD-10-CM | POA: Diagnosis not present

## 2014-07-09 DIAGNOSIS — I82432 Acute embolism and thrombosis of left popliteal vein: Secondary | ICD-10-CM | POA: Diagnosis not present

## 2014-07-09 DIAGNOSIS — I5033 Acute on chronic diastolic (congestive) heart failure: Secondary | ICD-10-CM

## 2014-07-09 NOTE — Progress Notes (Signed)
Subjective:    Patient ID: Kim [name], female    DOB: 09-15-1955, 59 y.o.   MRN: [id number]  HPIPCP - [facility],[name] ANP  89 yowf  schizophrenic smoker from a group home referred for evaluation of pna & effusion  She presented with Left Pleural effusion 07/2008 -Underwent Left thoracentesis x 1050 09/18/2008> ex with 79% lymphs, 16% eos WBC 3610 Last seen 6/10 >> eos 10.5 % with ESR only 17 not specific but this probably represents a late parapneumonic process with organization/ fibrotic change.   She was seen by PCP 06/13/11 , for productive cough, CXR showed rt lung basal opacity & blunting ofleft costophrenic angle - given ceftx IM & levaquin x 7ds, started on spiriva 'Can I have fluid taken off from lungs?' Smokes 10 cigs /day, no phlegm, dyspnea + Continues to hear voices on risperdal 4 mg daily CXR - shows resolved RLL process,left cp angle blunting cold represent pleural thickening, not much layering on decub film  02/17/14  [facility] follow up  Patient returns for a post hospital followup Patient was admitted September 15 through September 22 for acute hypercarbic respiratory failure with presumed pulmonary edema secondary to  COPD exacerbation and decompensated diastolic heart failure. Patient lives at a group home and presented to the emergency room with worsening shortness, of breath and hypoxemia. Patient required initial BiPAP, but unfortunately, continued to decompensate requiring intubation w/ vent support  2-D echo showed normal systolic function with an ejection fracture 55%, and a grade 1 diastolic dysfunction. She was treated with IV steroids, BD nebs and IV diuresis(neg 7 liters ) .(once b/p  Stabilized).  patient did have some elevated cardiac enzymes and underwent a left heart catheter, which was negative for coronary disease  She was discharged on Lasix 20 mg daily, and a prednisone taper. She finished her prednisone. Discharged on Oxygen at  4l/m .   Has cut back on smoking , advised on smoking cessation  On Spiriva and Ipratropium neb Four times a day   Says previously dx with OSA but says she absolutely can not wear CPAP.   03/2014  Developed LLE swelling -Duplex 03/2014 LLE DVT Spirometry FEV1 69%, FVC 78%, ratio 71 Did not desaturate on walking  07/09/2014 Follow up COPD and Diastolic CHF and LLE DVT  Patient presents for a three-month follow-up. Overall she says that she is doing well, with no flare cough or wheezing Has an occasional cough with clear mucus Unfortunately She continues to smoke. We discussed smoking cessation She remains on Xarelto for a left lower extremity DVT Denies any bleeding She is on Lasix as needed for lower extremity swelling, says overall swelling has been at baseline without any flare He denies any chest pain, orthopnea, PND, or increased leg swelling      Review of SystemsConstitutional:   No  weight loss, night sweats,  Fevers, chills,  +fatigue, or  lassitude.  HEENT:   No headaches,  Difficulty swallowing,  Tooth/dental problems, or  Sore throat,                No sneezing, itching, ear ache, nasal congestion, post nasal drip,   CV:  No chest pain,  Orthopnea, PND,   anasarca, dizziness, palpitations, syncope.   GI  No heartburn, indigestion, abdominal pain, nausea, vomiting, diarrhea, change in bowel habits, loss of appetite, bloody stools.   Resp:    No chest wall deformity  Skin: no rash or lesions.  GU: no dysuria,  change in color of urine, no urgency or frequency.  No flank pain, no hematuria   MS:  No joint pain or swelling.  No decreased range of motion.  No back pain.  Psych:  No change in mood or affect. No depression or anxiety.  No memory loss.         Objective:   Physical Exam  Gen. NAD, normal affect ENT - no lesions, no post nasal drip Neck: No JVD, no thyromegaly, no carotid bruits Lungs: no use of accessory muscles, no dullness to percussion, no  rhonchi ,  Cardiovascular: Rhythm regular, heart sounds  normal, no murmurs or gallops, tr-1 peripheral edema Abdomen: soft and non-tender, no hepatosplenomegaly, BS normal. Musculoskeletal: No deformities, no cyanosis or clubbing Neuro:  alert, non focal       Assessment & Plan:

## 2014-07-09 NOTE — Assessment & Plan Note (Signed)
Compensated without flare Advised on low salt diet

## 2014-07-09 NOTE — Assessment & Plan Note (Signed)
Continue on Xarelto. 

## 2014-07-09 NOTE — Patient Instructions (Signed)
Continue Spiriva 1 puff daily  Work on quitting smoking  Follow up Dr. Elsworth Soho  In 3 months  Please contact office for sooner follow up if symptoms do not improve or worsen or seek emergency care

## 2014-07-09 NOTE — Assessment & Plan Note (Signed)
Compensated on Spiriva Encouraged on smoking cessation

## 2014-07-13 NOTE — Progress Notes (Signed)
Reviewed & agree with plan  

## 2014-07-20 DIAGNOSIS — I10 Essential (primary) hypertension: Secondary | ICD-10-CM | POA: Diagnosis not present

## 2014-07-20 DIAGNOSIS — R609 Edema, unspecified: Secondary | ICD-10-CM | POA: Diagnosis not present

## 2014-07-20 DIAGNOSIS — J449 Chronic obstructive pulmonary disease, unspecified: Secondary | ICD-10-CM | POA: Diagnosis not present

## 2014-07-20 DIAGNOSIS — E1165 Type 2 diabetes mellitus with hyperglycemia: Secondary | ICD-10-CM | POA: Diagnosis not present

## 2014-09-01 DIAGNOSIS — F209 Schizophrenia, unspecified: Secondary | ICD-10-CM | POA: Diagnosis not present

## 2014-10-07 DIAGNOSIS — Z13820 Encounter for screening for osteoporosis: Secondary | ICD-10-CM | POA: Diagnosis not present

## 2014-10-13 ENCOUNTER — Encounter: Payer: Self-pay | Admitting: Cardiovascular Disease

## 2014-10-14 ENCOUNTER — Other Ambulatory Visit: Payer: Self-pay | Admitting: *Deleted

## 2014-10-14 MED ORDER — TIOTROPIUM BROMIDE MONOHYDRATE 18 MCG IN CAPS: 18.0000 ug | ORAL_CAPSULE | Freq: Every day | RESPIRATORY_TRACT | Status: DC

## 2014-11-06 ENCOUNTER — Ambulatory Visit: Payer: Medicare Other | Admitting: Cardiovascular Disease

## 2014-11-06 ENCOUNTER — Telehealth (HOSPITAL_COMMUNITY): Payer: Self-pay | Admitting: *Deleted

## 2014-11-18 ENCOUNTER — Encounter: Payer: Self-pay | Admitting: Pulmonary Disease

## 2014-11-18 ENCOUNTER — Ambulatory Visit (INDEPENDENT_AMBULATORY_CARE_PROVIDER_SITE_OTHER): Payer: Medicare Other | Admitting: Pulmonary Disease

## 2014-11-18 ENCOUNTER — Encounter (INDEPENDENT_AMBULATORY_CARE_PROVIDER_SITE_OTHER): Payer: Self-pay

## 2014-11-18 VITALS — BP 110/84 | HR 74 | Ht 64.0 in | Wt 183.0 lb

## 2014-11-18 DIAGNOSIS — J432 Centrilobular emphysema: Secondary | ICD-10-CM | POA: Diagnosis not present

## 2014-11-18 DIAGNOSIS — I82432 Acute embolism and thrombosis of left popliteal vein: Secondary | ICD-10-CM

## 2014-11-18 DIAGNOSIS — Z720 Tobacco use: Secondary | ICD-10-CM

## 2014-11-18 NOTE — Progress Notes (Signed)
   Subjective:    Patient ID: Kim [name], female    DOB: 1956/02/14, 59 y.o.   MRN: [id number]  HPI  [facility] - [facility],[name] ANP  56 yowf schizophrenic smoker from a group home for follow up COPD and Diastolic CHF and LLE DVT dx with OSA but cannot wear CPAP.    11/18/2014  Chief Complaint  Patient presents with  . Follow-up    Pt is currently still smoking. Pt c/o increased SOB due to weather. Prod cough with white mucus.      4 month follow-up. Overall she says that she is doing well, complains of cough with white sputum Has an occasional cough with clear mucus Unfortunately She continues to smoke, her bipolar symptoms remain uncontrolled, she is contemplating leaving the group home and living independently She remains on Xarelto for a left lower extremity DVT- no bleeding She is on Lasix as needed for lower extremity swelling, says overall swelling has been at baseline without any flare -denies any chest pain, orthopnea, PND, or increased leg swelling She does not seem to be using Atrovent nebs although this is listed with Spiriva on her med list   Significant tests/ events  Initial OV 07/2008 Left Pleural effusion  -Underwent Left thoracentesis x 1050 09/18/2008> ex with 79% lymphs, 16% eos WBC 3610 >>? late parapneumonic process with organization/ fibrotic change.   9/2015Admitted  for acute hypercarbic respiratory failure with acute pulmonary edema secondary to COPD exacerbation and decompensated diastolic heart failure.requiring intubation  2-D echo showed normal systolic function with an ejection fracture 55%, and a grade 1 diastolic dysfunction.  Left heart cath>> negative for coronary disease  03/2014 Duplex 03/2014 LLE DVT Spirometry FEV1 69%, FVC 78%, ratio 71  Review of Systems neg for any significant sore throat, dysphagia, itching, sneezing, nasal congestion or excess/ purulent secretions, fever, chills, sweats, unintended wt loss,  pleuritic or exertional cp, hempoptysis, orthopnea pnd or change in chronic leg swelling. Also denies presyncope, palpitations, heartburn, abdominal pain, nausea, vomiting, diarrhea or change in bowel or urinary habits, dysuria,hematuria, rash, arthralgias, visual complaints, headache, numbness weakness or ataxia.     Objective:   Physical Exam  Gen. Pleasant, obese, in no distress ENT - no lesions, no post nasal drip Neck: No JVD, no thyromegaly, no carotid bruits Lungs: no use of accessory muscles, no dullness to percussion, decreased without rales or rhonchi  Cardiovascular: Rhythm regular, heart sounds  normal, no murmurs or gallops, no peripheral edema Musculoskeletal: No deformities, no cyanosis or clubbing , no tremors       Assessment & Plan:

## 2014-11-18 NOTE — Patient Instructions (Signed)
Stay on spiriva Try to QUIT smoking !!

## 2014-11-19 NOTE — Assessment & Plan Note (Signed)
Smoking cessation is the most important intervention here Counseling provided again

## 2014-11-19 NOTE — Assessment & Plan Note (Signed)
Her risk factor is not clear. She can probably stop taking Xarelto

## 2014-11-19 NOTE — Assessment & Plan Note (Signed)
Continue Spiriva She can discontinue Atrovent nebs-and use albuterol as needed

## 2014-11-25 ENCOUNTER — Ambulatory Visit: Payer: Medicare Other | Admitting: Cardiovascular Disease

## 2014-11-30 DIAGNOSIS — F209 Schizophrenia, unspecified: Secondary | ICD-10-CM | POA: Diagnosis not present

## 2014-12-01 ENCOUNTER — Other Ambulatory Visit: Payer: Self-pay | Admitting: Cardiovascular Disease

## 2014-12-01 DIAGNOSIS — I82402 Acute embolism and thrombosis of unspecified deep veins of left lower extremity: Secondary | ICD-10-CM

## 2014-12-07 ENCOUNTER — Other Ambulatory Visit: Payer: Self-pay | Admitting: Pulmonary Disease

## 2014-12-11 ENCOUNTER — Inpatient Hospital Stay (HOSPITAL_COMMUNITY): Admission: RE | Admit: 2014-12-11 | Payer: Medicare Other | Source: Ambulatory Visit

## 2014-12-15 ENCOUNTER — Telehealth (HOSPITAL_COMMUNITY): Payer: Self-pay | Admitting: *Deleted

## 2014-12-16 ENCOUNTER — Ambulatory Visit (INDEPENDENT_AMBULATORY_CARE_PROVIDER_SITE_OTHER): Payer: Commercial Managed Care - HMO | Admitting: Cardiovascular Disease

## 2014-12-16 ENCOUNTER — Encounter: Payer: Self-pay | Admitting: Cardiovascular Disease

## 2014-12-16 VITALS — BP 116/82 | Ht 64.0 in | Wt 181.0 lb

## 2014-12-16 DIAGNOSIS — Z79899 Other long term (current) drug therapy: Secondary | ICD-10-CM | POA: Diagnosis not present

## 2014-12-16 DIAGNOSIS — I2510 Atherosclerotic heart disease of native coronary artery without angina pectoris: Secondary | ICD-10-CM | POA: Diagnosis not present

## 2014-12-16 DIAGNOSIS — I82402 Acute embolism and thrombosis of unspecified deep veins of left lower extremity: Secondary | ICD-10-CM

## 2014-12-16 DIAGNOSIS — Z720 Tobacco use: Secondary | ICD-10-CM

## 2014-12-16 DIAGNOSIS — E785 Hyperlipidemia, unspecified: Secondary | ICD-10-CM | POA: Diagnosis not present

## 2014-12-16 NOTE — Assessment & Plan Note (Signed)
History of hyperlipidemia currently not on a statin drug. We will recheck a lipid and liver profile 

## 2014-12-16 NOTE — Assessment & Plan Note (Signed)
History of a clean cardiac cath which I performed in September of last year (02/09/14).

## 2014-12-16 NOTE — Assessment & Plan Note (Signed)
Ongoing tobacco abuse recalcitrant risk factor modification. 

## 2014-12-16 NOTE — Patient Instructions (Signed)
  We will see you back in follow up in 1 year with an extender.   Dr Gwenlyn Found has ordered: 1. A FASTING lipid profile: to be done at your convenience.  There is a Psychologist, forensic lab on the first floor of this building, [address].  They are open from 8am-5pm with a lunch from 12-2.  You do not need an appointment.   2. lower venous duplex. This test is an ultrasound of the veins in the legs. It looks at venous blood flow that carries blood from the heart to the legs or arms. Allow one hour for a Lower Venous exam. There are no restrictions or special instructions.

## 2014-12-16 NOTE — Progress Notes (Signed)
12/16/2014 Kim [name]   1955-06-08  [id number]  Primary Physician No primary care provider on file. Primary Cardiologist: Lorretta Harp MD Renae Gloss   HPI:  Kim [name] is a 59 year old moderately overweight single Caucasian female with no children who I first met in the hospital in September. Her primary care physician is [facility]. She does have history of hyperlipidemia, hypertension, schizophrenia and non-insulin-requiring diabetes. She has COPD followed by Dr. Lavone Neri saw. She went gray catheterization by myself 02/09/14 revealing normal coronary arteries and normal LV function. She does have obstructive sleep apnea but does not C Pap. She left popliteal and tibial vein DVT and has been on Xarelto oral anticoagulation. She denies chest pain or shortness of breath.   Current Outpatient Prescriptions  Medication Sig Dispense Refill  . albuterol (PROVENTIL HFA;VENTOLIN HFA) 108 (90 BASE) MCG/ACT inhaler Inhale 2 puffs into the lungs every 6 (six) hours as needed for shortness of breath (Use as first line for wheezing / shortness of breath.). 1 Inhaler 3  . cholecalciferol (VITAMIN D) 400 UNITS TABS Take 400 Units by mouth daily.    . furosemide (LASIX) 20 MG tablet TAKE 1 OR 2 TABLETS BY MOUTH AS NEEDED 60 tablet 3  . ipratropium (ATROVENT) 0.02 % nebulizer solution Take 0.5 mg by nebulization 4 (four) times daily.    . metFORMIN (GLUCOPHAGE-XR) 750 MG 24 hr tablet Take 750 mg by mouth daily with breakfast.    . potassium chloride (K-DUR) 10 MEQ tablet Take 1 tablet (10 mEq total) by mouth daily. May take an extra potassium as needed. 45 tablet 11  . risperiDONE (RISPERDAL) 2 MG tablet Take 2 mg by mouth at bedtime.    . rivaroxaban (XARELTO) 20 MG TABS tablet Take 1 tablet (20 mg total) by mouth daily with supper. 30 tablet 11  . sertraline (ZOLOFT) 50 MG tablet Take 1 tablet by mouth daily.    [name] tiotropium (SPIRIVA HANDIHALER) 18 MCG inhalation capsule  Place 1 capsule (18 mcg total) into inhaler and inhale daily. 30 capsule 3   No current facility-administered medications for this visit.    Allergies  Allergen Reactions  . Penicillins Nausea And Vomiting    Large doses  . Quetiapine     REACTION: nausea/dizzy  . Acetaminophen Nausea And Vomiting and Rash    History   Social History  . Marital Status: Single    Spouse Name: N/A  . Number of Children: N/A  . Years of Education: N/A   Occupational History  . Not on file.   Social History Main Topics  . Smoking status: Current Every Day Smoker -- 1.50 packs/day for 21 years    Types: Cigarettes  . Smokeless tobacco: Never Used     Comment: smoking 1/2-1ppd (02/17/14)  . Alcohol Use: No  . Drug Use: No  . Sexual Activity: Yes    Birth Control/ Protection: None   Other Topics Concern  . Not on file   Social History Narrative   Resident at [facility],   [address] [place], [place] [address]     Review of Systems: General: negative for chills, fever, night sweats or weight changes.  Cardiovascular: negative for chest pain, dyspnea on exertion, edema, orthopnea, palpitations, paroxysmal nocturnal dyspnea or shortness of breath Dermatological: negative for rash Respiratory: negative for cough or wheezing Urologic: negative for hematuria Abdominal: negative for nausea, vomiting, diarrhea, bright red blood per rectum, melena, or hematemesis Neurologic: negative for visual changes,  syncope, or dizziness All other systems reviewed and are otherwise negative except as noted above.    Blood pressure 116/82, height 5' 4"  (1.626 m), weight 181 lb (82.101 kg).  General appearance: alert and no distress Neck: no adenopathy, no carotid bruit, no JVD, supple, symmetrical, trachea midline and thyroid not enlarged, symmetric, no tenderness/mass/nodules Lungs: clear to auscultation bilaterally Heart: regular rate and rhythm, S1, S2 normal, no murmur, click, rub or  gallop Extremities: mild left lower extremity edema chronic  EKG normal sinus rhythm at 62 without ST or T-wave changes. I personally reviewed this EKG  ASSESSMENT AND PLAN:   Tobacco abuse Ongoing tobacco abuse recalcitrant risk factor modification  Hyperlipidemia History of hyperlipidemia currently not on a statin drug. We will recheck a lipid and liver profile  DVT, popliteal, acute History of left popliteal and tibial vein DVTs in the past. Her last venous Doppler study performed in December continued to show this. We will recheck this again. She is on an oral anticoagulant  Coronary artery disease, non-occlusive History of a clean cardiac cath which I performed in September of last year (02/09/14).      Lorretta Harp MD FACP,FACC,FAHA, [facility] 12/16/2014 3:29 PM

## 2014-12-16 NOTE — Assessment & Plan Note (Signed)
History of left popliteal and tibial vein DVTs in the past. Her last venous Doppler study performed in December continued to show this. We will recheck this again. She is on an oral anticoagulant

## 2014-12-22 ENCOUNTER — Ambulatory Visit (HOSPITAL_COMMUNITY)
Admission: RE | Admit: 2014-12-22 | Discharge: 2014-12-22 | Disposition: A | Discharge status: Home / Self Care | Payer: Commercial Managed Care - HMO | Source: Ambulatory Visit | Attending: Cardiovascular Disease | Admitting: Cardiovascular Disease

## 2014-12-22 DIAGNOSIS — I82402 Acute embolism and thrombosis of unspecified deep veins of left lower extremity: Secondary | ICD-10-CM

## 2014-12-22 DIAGNOSIS — Z7902 Long term (current) use of antithrombotics/antiplatelets: Secondary | ICD-10-CM | POA: Insufficient documentation

## 2014-12-22 DIAGNOSIS — E785 Hyperlipidemia, unspecified: Secondary | ICD-10-CM | POA: Insufficient documentation

## 2014-12-22 DIAGNOSIS — I2510 Atherosclerotic heart disease of native coronary artery without angina pectoris: Secondary | ICD-10-CM | POA: Diagnosis not present

## 2014-12-22 DIAGNOSIS — Z86718 Personal history of other venous thrombosis and embolism: Secondary | ICD-10-CM | POA: Insufficient documentation

## 2014-12-22 DIAGNOSIS — I10 Essential (primary) hypertension: Secondary | ICD-10-CM | POA: Diagnosis not present

## 2014-12-22 DIAGNOSIS — J449 Chronic obstructive pulmonary disease, unspecified: Secondary | ICD-10-CM | POA: Insufficient documentation

## 2015-01-01 ENCOUNTER — Encounter: Payer: Self-pay | Admitting: *Deleted

## 2015-02-03 DIAGNOSIS — Z720 Tobacco use: Secondary | ICD-10-CM | POA: Diagnosis not present

## 2015-02-03 DIAGNOSIS — E1165 Type 2 diabetes mellitus with hyperglycemia: Secondary | ICD-10-CM | POA: Diagnosis not present

## 2015-02-03 DIAGNOSIS — I10 Essential (primary) hypertension: Secondary | ICD-10-CM | POA: Diagnosis not present

## 2015-02-18 ENCOUNTER — Telehealth: Payer: Self-pay | Admitting: Pulmonary Disease

## 2015-02-18 MED ORDER — TIOTROPIUM BROMIDE MONOHYDRATE 18 MCG IN CAPS: 18.0000 ug | ORAL_CAPSULE | Freq: Every day | RESPIRATORY_TRACT | Status: DC

## 2015-02-18 NOTE — Telephone Encounter (Signed)
SPiriva refilled. Nothing further needed.

## 2015-03-02 DIAGNOSIS — F209 Schizophrenia, unspecified: Secondary | ICD-10-CM | POA: Diagnosis not present

## 2015-03-22 ENCOUNTER — Ambulatory Visit (INDEPENDENT_AMBULATORY_CARE_PROVIDER_SITE_OTHER): Payer: Commercial Managed Care - HMO | Admitting: Adult Health

## 2015-03-22 ENCOUNTER — Encounter: Payer: Self-pay | Admitting: Adult Health

## 2015-03-22 VITALS — BP 114/86 | HR 60 | Temp 97.9°F | Ht 64.0 in | Wt 176.0 lb

## 2015-03-22 DIAGNOSIS — Z720 Tobacco use: Secondary | ICD-10-CM | POA: Diagnosis not present

## 2015-03-22 DIAGNOSIS — J439 Emphysema, unspecified: Secondary | ICD-10-CM | POA: Diagnosis not present

## 2015-03-22 NOTE — Assessment & Plan Note (Signed)
Mild COPD in a  smoker  Patient  Was encouraged to use  Spiriva daily,  However, she declines  We discussed that if her symptoms worsen she is to contact our office for sooner follow up and to restart Spiriva.  Encouraged on smoking cessation.

## 2015-03-22 NOTE — Assessment & Plan Note (Signed)
Smoking cessation discussed 

## 2015-03-22 NOTE — Progress Notes (Signed)
  Subjective:    Patient ID: Kim [name], female    DOB: 07-09-1955, 59 y.o.   MRN: [id number]  HPIPCP - [facility],[name] ANP  46  yowf  schizophrenic smoker from a group home referred for evaluation of pna & effusion  She presented with Left Pleural effusion 07/2008 -Underwent Left thoracentesis x 1050 09/18/2008> ex with 79% lymphs, 16% eos WBC 3610 Last seen 6/10 >> eos 10.5 % with ESR only 17 not specific but this probably represents a late parapneumonic process with organization/ fibrotic change.   2015 2-D echo showed normal systolic function with an ejection fracture 55%, and a grade 1 diastolic dysfunction.   -Duplex 03/2014 LLE DVT Spirometry 2015  FEV1 69%, FVC 78%, ratio 71 2015 Did not desaturate on walking  03/22/2015 Follow up COPD and Diastolic CHF and LLE DVT  Patient presents for a 4 -month follow-up.  patient says overall she has been doing well.  She says she has a minimal cough at times with clear mucus.  She denies any chest pain, orthopnea, PND or leg swelling.  Patient has previously been on Spiriva and was recently started on Advair.  She says she does not wish to take either of these medications.  We discussed her underlying lung problems and how these medications work.  However, after much encouragement. Patient is adamant that she is not going to take these medications.  Discussed a flu shot today, but she declined.   Patient continues to smoke, smoking cessation was encouraged.    Patient has a history of a left popliteal and tibial vein DVT .  Seen recently by cardiology.  A repeat Doppler study showed no DVT,  Xarelto was discontinued.      Review of SystemsConstitutional:   No  weight loss, night sweats,  Fevers, chills,  +fatigue, or  lassitude.  HEENT:   No headaches,  Difficulty swallowing,  Tooth/dental problems, or  Sore throat,                No sneezing, itching, ear ache, nasal congestion, post nasal drip,   CV:  No  chest pain,  Orthopnea, PND,   anasarca, dizziness, palpitations, syncope.   GI  No heartburn, indigestion, abdominal pain, nausea, vomiting, diarrhea, change in bowel habits, loss of appetite, bloody stools.   Resp:    No chest wall deformity  Skin: no rash or lesions.  GU: no dysuria, change in color of urine, no urgency or frequency.  No flank pain, no hematuria   MS:  No joint pain or swelling.  No decreased range of motion.  No back pain.  Psych:  No change in mood or affect. No depression or anxiety.  No memory loss.         Objective:   Physical Exam  Gen. NAD, flat affect  ENT - no lesions, no post nasal drip Neck: No JVD, no thyromegaly, no carotid bruits Lungs: no use of accessory muscles, no dullness to percussion, no rhonchi ,  Cardiovascular: Rhythm regular, heart sounds  normal, no murmurs or gallops, tr-1 peripheral edema Abdomen: soft and non-tender, no hepatosplenomegaly, BS normal. Musculoskeletal: No deformities, no cyanosis or clubbing Neuro:  alert, non focal       Assessment & Plan:

## 2015-03-22 NOTE — Patient Instructions (Signed)
Work on quitting smoking  Follow up Dr. Elsworth Soho  In 6 months  Please contact office for sooner follow up if symptoms do not improve or worsen or seek emergency care

## 2015-03-31 NOTE — Progress Notes (Signed)
Reviewed & agree with plan  

## 2015-04-13 ENCOUNTER — Other Ambulatory Visit: Payer: Self-pay | Admitting: Pulmonary Disease

## 2015-04-21 ENCOUNTER — Other Ambulatory Visit: Payer: Self-pay

## 2015-04-21 DIAGNOSIS — H6121 Impacted cerumen, right ear: Secondary | ICD-10-CM | POA: Diagnosis not present

## 2015-04-21 DIAGNOSIS — Z720 Tobacco use: Secondary | ICD-10-CM | POA: Diagnosis not present

## 2015-04-21 DIAGNOSIS — J209 Acute bronchitis, unspecified: Secondary | ICD-10-CM | POA: Diagnosis not present

## 2015-04-21 MED ORDER — ADVAIR DISKUS 250-50 MCG/DOSE IN AEPB: 1.0000 | INHALATION_SPRAY | Freq: Every day | RESPIRATORY_TRACT | Status: DC

## 2015-05-12 ENCOUNTER — Other Ambulatory Visit: Payer: Self-pay | Admitting: Pulmonary Disease

## 2015-05-26 DIAGNOSIS — I10 Essential (primary) hypertension: Secondary | ICD-10-CM | POA: Diagnosis not present

## 2015-05-26 DIAGNOSIS — E1165 Type 2 diabetes mellitus with hyperglycemia: Secondary | ICD-10-CM | POA: Diagnosis not present

## 2015-05-26 DIAGNOSIS — E782 Mixed hyperlipidemia: Secondary | ICD-10-CM | POA: Diagnosis not present

## 2015-05-26 DIAGNOSIS — R32 Unspecified urinary incontinence: Secondary | ICD-10-CM | POA: Diagnosis not present

## 2015-06-01 DIAGNOSIS — F209 Schizophrenia, unspecified: Secondary | ICD-10-CM | POA: Diagnosis not present

## 2015-07-17 DIAGNOSIS — F068 Other specified mental disorders due to known physiological condition: Secondary | ICD-10-CM | POA: Diagnosis not present

## 2015-07-18 DIAGNOSIS — F068 Other specified mental disorders due to known physiological condition: Secondary | ICD-10-CM | POA: Diagnosis not present

## 2015-07-19 DIAGNOSIS — F068 Other specified mental disorders due to known physiological condition: Secondary | ICD-10-CM | POA: Diagnosis not present

## 2015-07-20 DIAGNOSIS — F068 Other specified mental disorders due to known physiological condition: Secondary | ICD-10-CM | POA: Diagnosis not present

## 2015-07-21 DIAGNOSIS — F068 Other specified mental disorders due to known physiological condition: Secondary | ICD-10-CM | POA: Diagnosis not present

## 2015-07-22 DIAGNOSIS — F068 Other specified mental disorders due to known physiological condition: Secondary | ICD-10-CM | POA: Diagnosis not present

## 2015-07-23 DIAGNOSIS — F068 Other specified mental disorders due to known physiological condition: Secondary | ICD-10-CM | POA: Diagnosis not present

## 2015-08-31 ENCOUNTER — Ambulatory Visit: Payer: Commercial Managed Care - HMO | Admitting: Pulmonary Disease

## 2015-09-17 ENCOUNTER — Inpatient Hospital Stay (HOSPITAL_COMMUNITY): Admission: RE | Admit: 2015-09-17 | Payer: Self-pay | Source: Home / Self Care

## 2015-09-30 DIAGNOSIS — I82401 Acute embolism and thrombosis of unspecified deep veins of right lower extremity: Secondary | ICD-10-CM | POA: Diagnosis not present

## 2015-09-30 DIAGNOSIS — R609 Edema, unspecified: Secondary | ICD-10-CM | POA: Diagnosis not present

## 2015-09-30 DIAGNOSIS — L84 Corns and callosities: Secondary | ICD-10-CM | POA: Diagnosis not present

## 2015-10-06 DIAGNOSIS — F209 Schizophrenia, unspecified: Secondary | ICD-10-CM | POA: Diagnosis not present

## 2015-10-11 ENCOUNTER — Other Ambulatory Visit: Payer: Self-pay | Admitting: Nurse Practitioner

## 2015-10-11 DIAGNOSIS — R609 Edema, unspecified: Secondary | ICD-10-CM

## 2015-10-19 ENCOUNTER — Other Ambulatory Visit: Payer: Self-pay

## 2015-10-19 DIAGNOSIS — Z1231 Encounter for screening mammogram for malignant neoplasm of breast: Secondary | ICD-10-CM

## 2015-11-20 DIAGNOSIS — F068 Other specified mental disorders due to known physiological condition: Secondary | ICD-10-CM | POA: Diagnosis not present

## 2015-11-21 DIAGNOSIS — F068 Other specified mental disorders due to known physiological condition: Secondary | ICD-10-CM | POA: Diagnosis not present

## 2015-11-22 DIAGNOSIS — F068 Other specified mental disorders due to known physiological condition: Secondary | ICD-10-CM | POA: Diagnosis not present

## 2015-11-23 DIAGNOSIS — F068 Other specified mental disorders due to known physiological condition: Secondary | ICD-10-CM | POA: Diagnosis not present

## 2015-11-24 DIAGNOSIS — F068 Other specified mental disorders due to known physiological condition: Secondary | ICD-10-CM | POA: Diagnosis not present

## 2015-11-25 DIAGNOSIS — F068 Other specified mental disorders due to known physiological condition: Secondary | ICD-10-CM | POA: Diagnosis not present

## 2015-11-26 DIAGNOSIS — F068 Other specified mental disorders due to known physiological condition: Secondary | ICD-10-CM | POA: Diagnosis not present

## 2015-12-10 DIAGNOSIS — R609 Edema, unspecified: Secondary | ICD-10-CM | POA: Diagnosis not present

## 2015-12-10 DIAGNOSIS — R7309 Other abnormal glucose: Secondary | ICD-10-CM | POA: Diagnosis not present

## 2015-12-10 DIAGNOSIS — Z9119 Patient's noncompliance with other medical treatment and regimen: Secondary | ICD-10-CM | POA: Diagnosis not present

## 2015-12-23 ENCOUNTER — Telehealth: Payer: Self-pay | Admitting: Cardiovascular Disease

## 2015-12-23 NOTE — Telephone Encounter (Signed)
Returned call to patient at the home facility. The nurse answered. She went to the patient and patient said she needed to schedule the doppler. Patient said someone was supporsed to call to schedule. There is an order in [facility] for doppler from May but not by our office. Patient said something like the Internal Medicine doctor had ordered it. Advised for them to call patient's other doctor to see about ordering the doppler.

## 2015-12-23 NOTE — Telephone Encounter (Signed)
New message  Pt call requesting to speak with RN. Pt states she thinks she has a blood clot and wanted to speak with RN about getting a doppler completed. Please call back to discuss

## 2015-12-25 DIAGNOSIS — F068 Other specified mental disorders due to known physiological condition: Secondary | ICD-10-CM | POA: Diagnosis not present

## 2015-12-26 DIAGNOSIS — F068 Other specified mental disorders due to known physiological condition: Secondary | ICD-10-CM | POA: Diagnosis not present

## 2015-12-27 DIAGNOSIS — F068 Other specified mental disorders due to known physiological condition: Secondary | ICD-10-CM | POA: Diagnosis not present

## 2015-12-28 DIAGNOSIS — F068 Other specified mental disorders due to known physiological condition: Secondary | ICD-10-CM | POA: Diagnosis not present

## 2015-12-29 DIAGNOSIS — F068 Other specified mental disorders due to known physiological condition: Secondary | ICD-10-CM | POA: Diagnosis not present

## 2015-12-30 DIAGNOSIS — F068 Other specified mental disorders due to known physiological condition: Secondary | ICD-10-CM | POA: Diagnosis not present

## 2015-12-31 DIAGNOSIS — F068 Other specified mental disorders due to known physiological condition: Secondary | ICD-10-CM | POA: Diagnosis not present

## 2016-01-13 DIAGNOSIS — F209 Schizophrenia, unspecified: Secondary | ICD-10-CM | POA: Diagnosis not present

## 2016-01-15 DIAGNOSIS — F068 Other specified mental disorders due to known physiological condition: Secondary | ICD-10-CM | POA: Diagnosis not present

## 2016-01-16 DIAGNOSIS — F068 Other specified mental disorders due to known physiological condition: Secondary | ICD-10-CM | POA: Diagnosis not present

## 2016-01-17 DIAGNOSIS — F068 Other specified mental disorders due to known physiological condition: Secondary | ICD-10-CM | POA: Diagnosis not present

## 2016-01-18 DIAGNOSIS — F068 Other specified mental disorders due to known physiological condition: Secondary | ICD-10-CM | POA: Diagnosis not present

## 2016-01-21 DIAGNOSIS — F068 Other specified mental disorders due to known physiological condition: Secondary | ICD-10-CM | POA: Diagnosis not present

## 2016-02-07 DIAGNOSIS — F17210 Nicotine dependence, cigarettes, uncomplicated: Secondary | ICD-10-CM | POA: Diagnosis not present

## 2016-02-07 DIAGNOSIS — I10 Essential (primary) hypertension: Secondary | ICD-10-CM | POA: Diagnosis not present

## 2016-02-07 DIAGNOSIS — E782 Mixed hyperlipidemia: Secondary | ICD-10-CM | POA: Diagnosis not present

## 2016-02-07 DIAGNOSIS — Z716 Tobacco abuse counseling: Secondary | ICD-10-CM | POA: Diagnosis not present

## 2016-02-10 DIAGNOSIS — F209 Schizophrenia, unspecified: Secondary | ICD-10-CM | POA: Diagnosis not present

## 2016-03-18 DIAGNOSIS — F068 Other specified mental disorders due to known physiological condition: Secondary | ICD-10-CM | POA: Diagnosis not present

## 2016-03-19 DIAGNOSIS — F068 Other specified mental disorders due to known physiological condition: Secondary | ICD-10-CM | POA: Diagnosis not present

## 2016-03-20 DIAGNOSIS — F068 Other specified mental disorders due to known physiological condition: Secondary | ICD-10-CM | POA: Diagnosis not present

## 2016-03-21 DIAGNOSIS — F068 Other specified mental disorders due to known physiological condition: Secondary | ICD-10-CM | POA: Diagnosis not present

## 2016-03-21 DIAGNOSIS — H25813 Combined forms of age-related cataract, bilateral: Secondary | ICD-10-CM | POA: Diagnosis not present

## 2016-03-21 DIAGNOSIS — E119 Type 2 diabetes mellitus without complications: Secondary | ICD-10-CM | POA: Diagnosis not present

## 2016-03-22 DIAGNOSIS — F068 Other specified mental disorders due to known physiological condition: Secondary | ICD-10-CM | POA: Diagnosis not present

## 2016-03-23 DIAGNOSIS — F068 Other specified mental disorders due to known physiological condition: Secondary | ICD-10-CM | POA: Diagnosis not present

## 2016-03-24 DIAGNOSIS — F068 Other specified mental disorders due to known physiological condition: Secondary | ICD-10-CM | POA: Diagnosis not present

## 2016-03-25 DIAGNOSIS — F068 Other specified mental disorders due to known physiological condition: Secondary | ICD-10-CM | POA: Diagnosis not present

## 2016-03-26 DIAGNOSIS — F068 Other specified mental disorders due to known physiological condition: Secondary | ICD-10-CM | POA: Diagnosis not present

## 2016-03-27 DIAGNOSIS — F068 Other specified mental disorders due to known physiological condition: Secondary | ICD-10-CM | POA: Diagnosis not present

## 2016-03-28 DIAGNOSIS — F068 Other specified mental disorders due to known physiological condition: Secondary | ICD-10-CM | POA: Diagnosis not present

## 2016-03-29 DIAGNOSIS — F068 Other specified mental disorders due to known physiological condition: Secondary | ICD-10-CM | POA: Diagnosis not present

## 2016-03-30 DIAGNOSIS — F068 Other specified mental disorders due to known physiological condition: Secondary | ICD-10-CM | POA: Diagnosis not present

## 2016-03-31 DIAGNOSIS — F068 Other specified mental disorders due to known physiological condition: Secondary | ICD-10-CM | POA: Diagnosis not present

## 2016-04-01 DIAGNOSIS — F068 Other specified mental disorders due to known physiological condition: Secondary | ICD-10-CM | POA: Diagnosis not present

## 2016-04-02 DIAGNOSIS — F068 Other specified mental disorders due to known physiological condition: Secondary | ICD-10-CM | POA: Diagnosis not present

## 2016-04-03 DIAGNOSIS — F068 Other specified mental disorders due to known physiological condition: Secondary | ICD-10-CM | POA: Diagnosis not present

## 2016-04-04 DIAGNOSIS — F068 Other specified mental disorders due to known physiological condition: Secondary | ICD-10-CM | POA: Diagnosis not present

## 2016-04-05 DIAGNOSIS — F068 Other specified mental disorders due to known physiological condition: Secondary | ICD-10-CM | POA: Diagnosis not present

## 2016-04-06 DIAGNOSIS — F068 Other specified mental disorders due to known physiological condition: Secondary | ICD-10-CM | POA: Diagnosis not present

## 2016-04-07 DIAGNOSIS — F068 Other specified mental disorders due to known physiological condition: Secondary | ICD-10-CM | POA: Diagnosis not present

## 2016-04-08 DIAGNOSIS — F068 Other specified mental disorders due to known physiological condition: Secondary | ICD-10-CM | POA: Diagnosis not present

## 2016-04-09 DIAGNOSIS — F068 Other specified mental disorders due to known physiological condition: Secondary | ICD-10-CM | POA: Diagnosis not present

## 2016-04-10 DIAGNOSIS — F068 Other specified mental disorders due to known physiological condition: Secondary | ICD-10-CM | POA: Diagnosis not present

## 2016-04-11 DIAGNOSIS — F068 Other specified mental disorders due to known physiological condition: Secondary | ICD-10-CM | POA: Diagnosis not present

## 2016-04-12 DIAGNOSIS — F068 Other specified mental disorders due to known physiological condition: Secondary | ICD-10-CM | POA: Diagnosis not present

## 2016-04-13 DIAGNOSIS — F068 Other specified mental disorders due to known physiological condition: Secondary | ICD-10-CM | POA: Diagnosis not present

## 2016-04-14 DIAGNOSIS — F068 Other specified mental disorders due to known physiological condition: Secondary | ICD-10-CM | POA: Diagnosis not present

## 2016-04-15 DIAGNOSIS — F068 Other specified mental disorders due to known physiological condition: Secondary | ICD-10-CM | POA: Diagnosis not present

## 2016-04-16 DIAGNOSIS — F068 Other specified mental disorders due to known physiological condition: Secondary | ICD-10-CM | POA: Diagnosis not present

## 2016-04-17 DIAGNOSIS — F068 Other specified mental disorders due to known physiological condition: Secondary | ICD-10-CM | POA: Diagnosis not present

## 2016-04-18 DIAGNOSIS — F068 Other specified mental disorders due to known physiological condition: Secondary | ICD-10-CM | POA: Diagnosis not present

## 2016-04-19 DIAGNOSIS — F068 Other specified mental disorders due to known physiological condition: Secondary | ICD-10-CM | POA: Diagnosis not present

## 2016-04-21 DIAGNOSIS — F068 Other specified mental disorders due to known physiological condition: Secondary | ICD-10-CM | POA: Diagnosis not present

## 2016-04-22 DIAGNOSIS — F068 Other specified mental disorders due to known physiological condition: Secondary | ICD-10-CM | POA: Diagnosis not present

## 2016-04-23 DIAGNOSIS — F068 Other specified mental disorders due to known physiological condition: Secondary | ICD-10-CM | POA: Diagnosis not present

## 2016-04-24 DIAGNOSIS — F068 Other specified mental disorders due to known physiological condition: Secondary | ICD-10-CM | POA: Diagnosis not present

## 2016-04-25 DIAGNOSIS — F068 Other specified mental disorders due to known physiological condition: Secondary | ICD-10-CM | POA: Diagnosis not present

## 2016-04-26 DIAGNOSIS — F068 Other specified mental disorders due to known physiological condition: Secondary | ICD-10-CM | POA: Diagnosis not present

## 2016-04-27 DIAGNOSIS — F068 Other specified mental disorders due to known physiological condition: Secondary | ICD-10-CM | POA: Diagnosis not present

## 2016-04-28 DIAGNOSIS — F068 Other specified mental disorders due to known physiological condition: Secondary | ICD-10-CM | POA: Diagnosis not present

## 2016-04-29 DIAGNOSIS — F068 Other specified mental disorders due to known physiological condition: Secondary | ICD-10-CM | POA: Diagnosis not present

## 2016-04-30 DIAGNOSIS — F068 Other specified mental disorders due to known physiological condition: Secondary | ICD-10-CM | POA: Diagnosis not present

## 2016-05-01 DIAGNOSIS — F068 Other specified mental disorders due to known physiological condition: Secondary | ICD-10-CM | POA: Diagnosis not present

## 2016-05-02 DIAGNOSIS — F068 Other specified mental disorders due to known physiological condition: Secondary | ICD-10-CM | POA: Diagnosis not present

## 2016-05-03 DIAGNOSIS — F068 Other specified mental disorders due to known physiological condition: Secondary | ICD-10-CM | POA: Diagnosis not present

## 2016-05-04 DIAGNOSIS — F068 Other specified mental disorders due to known physiological condition: Secondary | ICD-10-CM | POA: Diagnosis not present

## 2016-05-05 DIAGNOSIS — F068 Other specified mental disorders due to known physiological condition: Secondary | ICD-10-CM | POA: Diagnosis not present

## 2016-05-06 DIAGNOSIS — F068 Other specified mental disorders due to known physiological condition: Secondary | ICD-10-CM | POA: Diagnosis not present

## 2016-05-07 DIAGNOSIS — F068 Other specified mental disorders due to known physiological condition: Secondary | ICD-10-CM | POA: Diagnosis not present

## 2016-05-08 DIAGNOSIS — F068 Other specified mental disorders due to known physiological condition: Secondary | ICD-10-CM | POA: Diagnosis not present

## 2016-05-09 DIAGNOSIS — F068 Other specified mental disorders due to known physiological condition: Secondary | ICD-10-CM | POA: Diagnosis not present

## 2016-05-10 DIAGNOSIS — F068 Other specified mental disorders due to known physiological condition: Secondary | ICD-10-CM | POA: Diagnosis not present

## 2016-05-11 DIAGNOSIS — F068 Other specified mental disorders due to known physiological condition: Secondary | ICD-10-CM | POA: Diagnosis not present

## 2016-05-12 DIAGNOSIS — F068 Other specified mental disorders due to known physiological condition: Secondary | ICD-10-CM | POA: Diagnosis not present

## 2016-05-13 DIAGNOSIS — F068 Other specified mental disorders due to known physiological condition: Secondary | ICD-10-CM | POA: Diagnosis not present

## 2016-05-14 DIAGNOSIS — F068 Other specified mental disorders due to known physiological condition: Secondary | ICD-10-CM | POA: Diagnosis not present

## 2016-05-15 DIAGNOSIS — F068 Other specified mental disorders due to known physiological condition: Secondary | ICD-10-CM | POA: Diagnosis not present

## 2016-05-16 DIAGNOSIS — F068 Other specified mental disorders due to known physiological condition: Secondary | ICD-10-CM | POA: Diagnosis not present

## 2016-05-17 DIAGNOSIS — F068 Other specified mental disorders due to known physiological condition: Secondary | ICD-10-CM | POA: Diagnosis not present

## 2016-05-18 DIAGNOSIS — F068 Other specified mental disorders due to known physiological condition: Secondary | ICD-10-CM | POA: Diagnosis not present

## 2016-05-19 DIAGNOSIS — F068 Other specified mental disorders due to known physiological condition: Secondary | ICD-10-CM | POA: Diagnosis not present

## 2016-05-22 DIAGNOSIS — F068 Other specified mental disorders due to known physiological condition: Secondary | ICD-10-CM | POA: Diagnosis not present

## 2016-05-23 DIAGNOSIS — F068 Other specified mental disorders due to known physiological condition: Secondary | ICD-10-CM | POA: Diagnosis not present

## 2016-05-24 DIAGNOSIS — F068 Other specified mental disorders due to known physiological condition: Secondary | ICD-10-CM | POA: Diagnosis not present

## 2016-05-25 DIAGNOSIS — F068 Other specified mental disorders due to known physiological condition: Secondary | ICD-10-CM | POA: Diagnosis not present

## 2016-05-26 DIAGNOSIS — F068 Other specified mental disorders due to known physiological condition: Secondary | ICD-10-CM | POA: Diagnosis not present

## 2016-05-27 DIAGNOSIS — F068 Other specified mental disorders due to known physiological condition: Secondary | ICD-10-CM | POA: Diagnosis not present

## 2016-05-28 DIAGNOSIS — F068 Other specified mental disorders due to known physiological condition: Secondary | ICD-10-CM | POA: Diagnosis not present

## 2016-05-29 DIAGNOSIS — F068 Other specified mental disorders due to known physiological condition: Secondary | ICD-10-CM | POA: Diagnosis not present

## 2016-05-30 DIAGNOSIS — F068 Other specified mental disorders due to known physiological condition: Secondary | ICD-10-CM | POA: Diagnosis not present

## 2016-05-31 DIAGNOSIS — F068 Other specified mental disorders due to known physiological condition: Secondary | ICD-10-CM | POA: Diagnosis not present

## 2016-06-01 ENCOUNTER — Encounter (HOSPITAL_COMMUNITY): Payer: Self-pay | Admitting: Family Medicine

## 2016-06-01 ENCOUNTER — Ambulatory Visit (HOSPITAL_COMMUNITY)
Admission: EM | Admit: 2016-06-01 | Discharge: 2016-06-01 | Disposition: A | Discharge status: Home / Self Care | Payer: Medicare HMO | Attending: Emergency Medicine | Admitting: Emergency Medicine

## 2016-06-01 DIAGNOSIS — F209 Schizophrenia, unspecified: Secondary | ICD-10-CM | POA: Diagnosis not present

## 2016-06-01 DIAGNOSIS — J40 Bronchitis, not specified as acute or chronic: Secondary | ICD-10-CM

## 2016-06-01 DIAGNOSIS — F068 Other specified mental disorders due to known physiological condition: Secondary | ICD-10-CM | POA: Diagnosis not present

## 2016-06-01 MED ORDER — LORATADINE 10 MG PO TABS: 10.0000 mg | ORAL_TABLET | Freq: Every day | ORAL | 0 refills | Status: DC

## 2016-06-01 MED ORDER — PREDNISONE 50 MG PO TABS: ORAL_TABLET | ORAL | 0 refills | Status: DC

## 2016-06-01 MED ORDER — IPRATROPIUM-ALBUTEROL 0.5-2.5 (3) MG/3ML IN SOLN
3.0000 mL | Freq: Once | RESPIRATORY_TRACT | Status: AC
  Administered 2016-06-01: 3 mL via RESPIRATORY_TRACT

## 2016-06-01 MED ORDER — IPRATROPIUM-ALBUTEROL 0.5-2.5 (3) MG/3ML IN SOLN
RESPIRATORY_TRACT | Status: AC
  Filled 2016-06-01: qty 3

## 2016-06-01 MED ORDER — AZITHROMYCIN 250 MG PO TABS: ORAL_TABLET | ORAL | 0 refills | Status: DC

## 2016-06-01 NOTE — Discharge Instructions (Signed)
You have bronchitis. Take azithromycin and prednisone as prescribed. Use the albuterol every 4 hours as needed for wheezing or cough. You should see improvement in the next 3-5 days. If you develop fevers, difficulty breathing, or are just not getting better, please come back or go to the emergency room.

## 2016-06-01 NOTE — ED Triage Notes (Signed)
Pt here for nasal congestion and mucous for months. sts 3 days ago started coughing.

## 2016-06-01 NOTE — ED Provider Notes (Signed)
[facility]    CSN: [id number] Arrival date & time: 06/01/16  1646     History   Chief Complaint Chief Complaint  Patient presents with  . Nasal Congestion    HPI Kim [name] is a 61 y.o. female.   HPI She is a 61 year old woman here for evaluation of chest congestion. She reports a several month history of nasal congestion and postnasal drainage. She states over the last 3 days, she has developed a cough, chest congestion, and worsening nasal congestion. No known fevers. She does report some wheezing, primarily with cough. No shortness of breath. She has not used her albuterol inhaler.  Past Medical History:  Diagnosis Date  . CHF (congestive heart failure) ([place])   . Diabetes mellitus without complication ([place])   . GERD (gastroesophageal reflux disease)   . History of DVT (deep vein thrombosis)   . Hypercholesteremia   . Hyperlipemia   . Normal coronary arteries    by cardiac catheterization which I performed September 2015  . Schizophrenia ([place])   . Tobacco abuse     Patient Active Problem List   Diagnosis Date Noted  . COPD with emphysema ([place]) 04/07/2014  . Acute on chronic diastolic heart failure ([place]) 03/18/2014  . DVT, popliteal, acute ([place]) 03/18/2014  . Tobacco abuse 03/18/2014  . Coronary artery disease, non-occlusive 02/10/2014  . Angina, class III - related to Acute Diastolic HF & COPD Exacerbation; Non-obstructive CAD 02/09/2014  . Hyperlipidemia 08/28/2008    Past Surgical History:  Procedure Laterality Date  . LEFT HEART CATHETERIZATION WITH CORONARY ANGIOGRAM N/A 02/09/2014   Procedure: LEFT HEART CATHETERIZATION WITH CORONARY ANGIOGRAM;  Surgeon: Lorretta Harp, MD;  Location: [facility] CATH LAB;  Service: Cardiovascular;  Laterality: N/A;  . none      OB History    No data available       Home Medications    Prior to Admission medications   Medication Sig Start Date End Date Taking? Authorizing Provider  ADVAIR DISKUS 250-50  MCG/DOSE AEPB Inhale 1 puff into the lungs daily. 04/21/15   Rigoberto Noel, MD  albuterol (PROVENTIL HFA;VENTOLIN HFA) 108 (90 BASE) MCG/ACT inhaler Inhale 2 puffs into the lungs every 6 (six) hours as needed for shortness of breath (Use as first line for wheezing / shortness of breath.). 02/10/14   Donita Brooks, NP  azithromycin (ZITHROMAX Z-PAK) 250 MG tablet Take 2 pills today, then 1 pill daily until gone. 06/01/16   Melony Overly, MD  cholecalciferol (VITAMIN D) 400 UNITS TABS Take 400 Units by mouth daily.    Historical Provider, MD  furosemide (LASIX) 20 MG tablet TAKE 1 OR 2 TABLETS BY MOUTH AS NEEDED 05/13/15   Rigoberto Noel, MD  ipratropium (ATROVENT) 0.02 % nebulizer solution Take 0.5 mg by nebulization 4 (four) times daily.    Historical Provider, MD  loratadine (CLARITIN) 10 MG tablet Take 1 tablet (10 mg total) by mouth daily. 06/01/16   Melony Overly, MD  metFORMIN (GLUCOPHAGE-XR) 750 MG 24 hr tablet Take 750 mg by mouth daily with breakfast.    Historical Provider, MD  potassium chloride (K-DUR) 10 MEQ tablet Take 1 tablet (10 mEq total) by mouth daily. May take an extra potassium as needed. 02/20/14   Brett Canales, PA-C  predniSONE (DELTASONE) 50 MG tablet Take 1 pill daily for 5 days. 06/01/16   Melony Overly, MD  risperiDONE (RISPERDAL) 2 MG tablet Take 2 mg by mouth at bedtime.  Historical Provider, MD  rivaroxaban (XARELTO) 20 MG TABS tablet Take 1 tablet (20 mg total) by mouth daily with supper. Patient not taking: Reported on 03/22/2015 04/01/14   Brett Canales, PA-C  sertraline (ZOLOFT) 50 MG tablet Take 1 tablet by mouth daily. 12/15/14   Historical Provider, MD  SPIRIVA HANDIHALER 18 MCG inhalation capsule Place 1 capsule (18 mcg total) into inhaler and inhale daily. 04/14/15   Rigoberto Noel, MD    Family History History reviewed. No pertinent family history.  Social History Social History  Substance Use Topics  . Smoking status: Current Every Day Smoker    Packs/day:  1.50    Years: 21.00    Types: Cigarettes  . Smokeless tobacco: Never Used     Comment: smoking 1/2-1ppd (02/17/14)  . Alcohol use No     Allergies   Penicillins; Quetiapine; and Acetaminophen   Review of Systems Review of Systems As in history of present illness  Physical Exam Triage Vital Signs ED Triage Vitals [06/01/16 1713]  Enc Vitals Group     BP 112/81     Pulse Rate 67     Resp 18     Temp 98.1 F (36.7 C)     Temp src      SpO2 94 %     Weight      Height      Head Circumference      Peak Flow      Pain Score      Pain Loc      Pain Edu?      Excl. in [place]?    No data found.   Updated Vital Signs BP 112/81   Pulse 67   Temp 98.1 F (36.7 C)   Resp 18   SpO2 94%   Visual Acuity Right Eye Distance:   Left Eye Distance:   Bilateral Distance:    Right Eye Near:   Left Eye Near:    Bilateral Near:     Physical Exam  Constitutional: She is oriented to person, place, and time. She appears well-developed and well-nourished. No distress.  HENT:  Clear drainage in nose. Clear postnasal drainage seen.  Neck: Neck supple.  Cardiovascular: Normal rate, regular rhythm and normal heart sounds.   No murmur heard. Pulmonary/Chest: Effort normal. No respiratory distress. She has wheezes. She has no rales.  Lymphadenopathy:    She has no cervical adenopathy.  Neurological: She is alert and oriented to person, place, and time.     UC Treatments / Results  Labs (all labs ordered are listed, but only abnormal results are displayed) Labs Reviewed - No data to display  EKG  EKG Interpretation None       Radiology No results found.  Procedures Procedures (including critical care time)  Medications Ordered in UC Medications  ipratropium-albuterol (DUONEB) 0.5-2.5 (3) MG/3ML nebulizer solution 3 mL (3 mLs Nebulization Given 06/01/16 1736)     Initial Impression / Assessment and Plan / UC Course  I have reviewed the triage vital signs and the  nursing notes.  Pertinent labs & imaging results that were available during my care of the patient were reviewed by me and considered in my medical decision making (see chart for details).  Clinical Course     Treat with azithromycin and prednisone. Recommended use of her albuterol inhaler every 4 hours for the next 2 days. Follow-up as needed.  Final Clinical Impressions(s) / UC Diagnoses   Final diagnoses:  Bronchitis  New Prescriptions New Prescriptions   AZITHROMYCIN (ZITHROMAX Z-PAK) 250 MG TABLET    Take 2 pills today, then 1 pill daily until gone.   LORATADINE (CLARITIN) 10 MG TABLET    Take 1 tablet (10 mg total) by mouth daily.   PREDNISONE (DELTASONE) 50 MG TABLET    Take 1 pill daily for 5 days.     Melony Overly, MD 06/01/16 [phone]

## 2016-06-02 DIAGNOSIS — F068 Other specified mental disorders due to known physiological condition: Secondary | ICD-10-CM | POA: Diagnosis not present

## 2016-06-03 DIAGNOSIS — F068 Other specified mental disorders due to known physiological condition: Secondary | ICD-10-CM | POA: Diagnosis not present

## 2016-06-04 DIAGNOSIS — F068 Other specified mental disorders due to known physiological condition: Secondary | ICD-10-CM | POA: Diagnosis not present

## 2016-06-05 DIAGNOSIS — F068 Other specified mental disorders due to known physiological condition: Secondary | ICD-10-CM | POA: Diagnosis not present

## 2016-06-06 DIAGNOSIS — F068 Other specified mental disorders due to known physiological condition: Secondary | ICD-10-CM | POA: Diagnosis not present

## 2016-06-07 DIAGNOSIS — F068 Other specified mental disorders due to known physiological condition: Secondary | ICD-10-CM | POA: Diagnosis not present

## 2016-06-08 DIAGNOSIS — F068 Other specified mental disorders due to known physiological condition: Secondary | ICD-10-CM | POA: Diagnosis not present

## 2016-06-09 DIAGNOSIS — F068 Other specified mental disorders due to known physiological condition: Secondary | ICD-10-CM | POA: Diagnosis not present

## 2016-06-10 DIAGNOSIS — F068 Other specified mental disorders due to known physiological condition: Secondary | ICD-10-CM | POA: Diagnosis not present

## 2016-06-11 DIAGNOSIS — F068 Other specified mental disorders due to known physiological condition: Secondary | ICD-10-CM | POA: Diagnosis not present

## 2016-06-12 DIAGNOSIS — F068 Other specified mental disorders due to known physiological condition: Secondary | ICD-10-CM | POA: Diagnosis not present

## 2016-06-13 DIAGNOSIS — F068 Other specified mental disorders due to known physiological condition: Secondary | ICD-10-CM | POA: Diagnosis not present

## 2016-06-14 DIAGNOSIS — F068 Other specified mental disorders due to known physiological condition: Secondary | ICD-10-CM | POA: Diagnosis not present

## 2016-06-15 DIAGNOSIS — F068 Other specified mental disorders due to known physiological condition: Secondary | ICD-10-CM | POA: Diagnosis not present

## 2016-06-16 DIAGNOSIS — F068 Other specified mental disorders due to known physiological condition: Secondary | ICD-10-CM | POA: Diagnosis not present

## 2016-06-17 DIAGNOSIS — F068 Other specified mental disorders due to known physiological condition: Secondary | ICD-10-CM | POA: Diagnosis not present

## 2016-06-18 DIAGNOSIS — F068 Other specified mental disorders due to known physiological condition: Secondary | ICD-10-CM | POA: Diagnosis not present

## 2016-06-19 DIAGNOSIS — F209 Schizophrenia, unspecified: Secondary | ICD-10-CM | POA: Diagnosis not present

## 2016-06-19 DIAGNOSIS — F068 Other specified mental disorders due to known physiological condition: Secondary | ICD-10-CM | POA: Diagnosis not present

## 2016-06-22 DIAGNOSIS — F068 Other specified mental disorders due to known physiological condition: Secondary | ICD-10-CM | POA: Diagnosis not present

## 2016-06-23 DIAGNOSIS — F068 Other specified mental disorders due to known physiological condition: Secondary | ICD-10-CM | POA: Diagnosis not present

## 2016-06-24 ENCOUNTER — Emergency Department (HOSPITAL_COMMUNITY)
Admission: EM | Admit: 2016-06-24 | Discharge: 2016-06-24 | Disposition: A | Discharge status: Home / Self Care | Payer: Medicare HMO | Attending: Emergency Medicine | Admitting: Emergency Medicine

## 2016-06-24 ENCOUNTER — Encounter (HOSPITAL_COMMUNITY): Payer: Self-pay

## 2016-06-24 ENCOUNTER — Emergency Department (HOSPITAL_COMMUNITY): Payer: Medicare HMO

## 2016-06-24 DIAGNOSIS — R0789 Other chest pain: Secondary | ICD-10-CM | POA: Insufficient documentation

## 2016-06-24 DIAGNOSIS — F068 Other specified mental disorders due to known physiological condition: Secondary | ICD-10-CM | POA: Diagnosis not present

## 2016-06-24 DIAGNOSIS — E119 Type 2 diabetes mellitus without complications: Secondary | ICD-10-CM | POA: Insufficient documentation

## 2016-06-24 DIAGNOSIS — F17200 Nicotine dependence, unspecified, uncomplicated: Secondary | ICD-10-CM | POA: Diagnosis not present

## 2016-06-24 DIAGNOSIS — I5033 Acute on chronic diastolic (congestive) heart failure: Secondary | ICD-10-CM | POA: Diagnosis not present

## 2016-06-24 DIAGNOSIS — Z7901 Long term (current) use of anticoagulants: Secondary | ICD-10-CM | POA: Diagnosis not present

## 2016-06-24 DIAGNOSIS — Z79899 Other long term (current) drug therapy: Secondary | ICD-10-CM | POA: Diagnosis not present

## 2016-06-24 DIAGNOSIS — R079 Chest pain, unspecified: Secondary | ICD-10-CM | POA: Diagnosis not present

## 2016-06-24 DIAGNOSIS — J449 Chronic obstructive pulmonary disease, unspecified: Secondary | ICD-10-CM | POA: Insufficient documentation

## 2016-06-24 DIAGNOSIS — I2510 Atherosclerotic heart disease of native coronary artery without angina pectoris: Secondary | ICD-10-CM | POA: Diagnosis not present

## 2016-06-24 DIAGNOSIS — Z7984 Long term (current) use of oral hypoglycemic drugs: Secondary | ICD-10-CM | POA: Diagnosis not present

## 2016-06-24 HISTORY — DX: Chronic obstructive pulmonary disease, unspecified: J44.9

## 2016-06-24 LAB — BASIC METABOLIC PANEL
Anion gap: 13 (ref 5–15)
BUN: <5 mg/dL — ABNORMAL LOW (ref 6–20)
CALCIUM: 9.7 mg/dL (ref 8.9–10.3)
CO2: 25 mmol/L (ref 22–32)
Chloride: 97 mmol/L — ABNORMAL LOW (ref 101–111)
Creatinine, Ser: 0.67 mg/dL (ref 0.44–1.00)
GFR calc non Af Amer: >60 mL/min (ref >60)
Glucose, Bld: 85 mg/dL (ref 65–99)
Potassium: 3.2 mmol/L — ABNORMAL LOW (ref 3.5–5.1)
SODIUM: 135 mmol/L (ref 135–145)

## 2016-06-24 LAB — CBC
HCT: 43 % (ref 36.0–46.0)
Hemoglobin: 14.1 g/dL (ref 12.0–15.0)
MCH: 31.5 pg (ref 26.0–34.0)
MCHC: 32.8 g/dL (ref 30.0–36.0)
MCV: 96.2 fL (ref 78.0–100.0)
PLATELETS: 249 10*3/uL (ref 150–400)
RBC: 4.47 MIL/uL (ref 3.87–5.11)
RDW: 13.4 % (ref 11.5–15.5)
WBC: 9.8 10*3/uL (ref 4.0–10.5)

## 2016-06-24 LAB — I-STAT TROPONIN, ED: Troponin i, poc: 0 ng/mL (ref 0.00–0.08)

## 2016-06-24 MED ORDER — DICLOFENAC SODIUM 1 % TD GEL: 2.0000 g | Freq: Four times a day (QID) | TRANSDERMAL | 0 refills | Status: DC
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
## 2016-06-24 NOTE — ED Notes (Signed)
Cardiac alarms checked

## 2016-06-24 NOTE — ED Triage Notes (Signed)
Onset 1 week productive cough, yellow, thick.  Onset this morning mid to left chest pain.  Worse with movement and holding left arm up.

## 2016-06-25 DIAGNOSIS — F068 Other specified mental disorders due to known physiological condition: Secondary | ICD-10-CM | POA: Diagnosis not present

## 2016-06-26 DIAGNOSIS — F068 Other specified mental disorders due to known physiological condition: Secondary | ICD-10-CM | POA: Diagnosis not present

## 2016-06-27 DIAGNOSIS — F068 Other specified mental disorders due to known physiological condition: Secondary | ICD-10-CM | POA: Diagnosis not present

## 2016-06-28 DIAGNOSIS — F068 Other specified mental disorders due to known physiological condition: Secondary | ICD-10-CM | POA: Diagnosis not present

## 2016-06-29 DIAGNOSIS — F068 Other specified mental disorders due to known physiological condition: Secondary | ICD-10-CM | POA: Diagnosis not present

## 2016-06-30 DIAGNOSIS — F068 Other specified mental disorders due to known physiological condition: Secondary | ICD-10-CM | POA: Diagnosis not present

## 2016-07-01 DIAGNOSIS — F068 Other specified mental disorders due to known physiological condition: Secondary | ICD-10-CM | POA: Diagnosis not present

## 2016-07-02 DIAGNOSIS — F068 Other specified mental disorders due to known physiological condition: Secondary | ICD-10-CM | POA: Diagnosis not present

## 2016-07-03 DIAGNOSIS — F068 Other specified mental disorders due to known physiological condition: Secondary | ICD-10-CM | POA: Diagnosis not present

## 2016-07-04 DIAGNOSIS — F068 Other specified mental disorders due to known physiological condition: Secondary | ICD-10-CM | POA: Diagnosis not present

## 2016-07-05 DIAGNOSIS — F068 Other specified mental disorders due to known physiological condition: Secondary | ICD-10-CM | POA: Diagnosis not present

## 2016-07-06 DIAGNOSIS — F068 Other specified mental disorders due to known physiological condition: Secondary | ICD-10-CM | POA: Diagnosis not present

## 2016-07-07 DIAGNOSIS — F068 Other specified mental disorders due to known physiological condition: Secondary | ICD-10-CM | POA: Diagnosis not present

## 2016-07-08 DIAGNOSIS — F068 Other specified mental disorders due to known physiological condition: Secondary | ICD-10-CM | POA: Diagnosis not present

## 2016-07-09 DIAGNOSIS — F068 Other specified mental disorders due to known physiological condition: Secondary | ICD-10-CM | POA: Diagnosis not present

## 2016-07-10 DIAGNOSIS — F068 Other specified mental disorders due to known physiological condition: Secondary | ICD-10-CM | POA: Diagnosis not present

## 2016-07-11 DIAGNOSIS — F068 Other specified mental disorders due to known physiological condition: Secondary | ICD-10-CM | POA: Diagnosis not present

## 2016-07-12 DIAGNOSIS — F068 Other specified mental disorders due to known physiological condition: Secondary | ICD-10-CM | POA: Diagnosis not present

## 2016-07-13 DIAGNOSIS — F068 Other specified mental disorders due to known physiological condition: Secondary | ICD-10-CM | POA: Diagnosis not present

## 2016-07-14 DIAGNOSIS — F068 Other specified mental disorders due to known physiological condition: Secondary | ICD-10-CM | POA: Diagnosis not present

## 2016-07-15 DIAGNOSIS — F068 Other specified mental disorders due to known physiological condition: Secondary | ICD-10-CM | POA: Diagnosis not present

## 2016-07-16 DIAGNOSIS — F068 Other specified mental disorders due to known physiological condition: Secondary | ICD-10-CM | POA: Diagnosis not present

## 2016-07-17 DIAGNOSIS — F068 Other specified mental disorders due to known physiological condition: Secondary | ICD-10-CM | POA: Diagnosis not present

## 2016-07-18 DIAGNOSIS — F068 Other specified mental disorders due to known physiological condition: Secondary | ICD-10-CM | POA: Diagnosis not present

## 2016-07-19 DIAGNOSIS — F068 Other specified mental disorders due to known physiological condition: Secondary | ICD-10-CM | POA: Diagnosis not present

## 2016-07-20 DIAGNOSIS — F068 Other specified mental disorders due to known physiological condition: Secondary | ICD-10-CM | POA: Diagnosis not present

## 2016-07-21 DIAGNOSIS — F068 Other specified mental disorders due to known physiological condition: Secondary | ICD-10-CM | POA: Diagnosis not present

## 2016-07-22 DIAGNOSIS — F068 Other specified mental disorders due to known physiological condition: Secondary | ICD-10-CM | POA: Diagnosis not present

## 2016-07-23 DIAGNOSIS — F068 Other specified mental disorders due to known physiological condition: Secondary | ICD-10-CM | POA: Diagnosis not present

## 2016-07-24 DIAGNOSIS — F068 Other specified mental disorders due to known physiological condition: Secondary | ICD-10-CM | POA: Diagnosis not present

## 2016-07-25 DIAGNOSIS — F068 Other specified mental disorders due to known physiological condition: Secondary | ICD-10-CM | POA: Diagnosis not present

## 2016-07-26 DIAGNOSIS — F068 Other specified mental disorders due to known physiological condition: Secondary | ICD-10-CM | POA: Diagnosis not present

## 2016-07-27 DIAGNOSIS — F068 Other specified mental disorders due to known physiological condition: Secondary | ICD-10-CM | POA: Diagnosis not present

## 2016-07-28 DIAGNOSIS — F068 Other specified mental disorders due to known physiological condition: Secondary | ICD-10-CM | POA: Diagnosis not present

## 2016-07-29 DIAGNOSIS — F068 Other specified mental disorders due to known physiological condition: Secondary | ICD-10-CM | POA: Diagnosis not present

## 2016-07-30 DIAGNOSIS — F068 Other specified mental disorders due to known physiological condition: Secondary | ICD-10-CM | POA: Diagnosis not present

## 2016-07-31 DIAGNOSIS — F068 Other specified mental disorders due to known physiological condition: Secondary | ICD-10-CM | POA: Diagnosis not present

## 2016-08-01 DIAGNOSIS — F068 Other specified mental disorders due to known physiological condition: Secondary | ICD-10-CM | POA: Diagnosis not present

## 2016-08-02 DIAGNOSIS — F068 Other specified mental disorders due to known physiological condition: Secondary | ICD-10-CM | POA: Diagnosis not present

## 2016-08-03 DIAGNOSIS — F068 Other specified mental disorders due to known physiological condition: Secondary | ICD-10-CM | POA: Diagnosis not present

## 2016-08-04 DIAGNOSIS — F068 Other specified mental disorders due to known physiological condition: Secondary | ICD-10-CM | POA: Diagnosis not present

## 2016-08-05 DIAGNOSIS — F068 Other specified mental disorders due to known physiological condition: Secondary | ICD-10-CM | POA: Diagnosis not present

## 2016-08-06 DIAGNOSIS — F068 Other specified mental disorders due to known physiological condition: Secondary | ICD-10-CM | POA: Diagnosis not present

## 2016-08-07 DIAGNOSIS — F068 Other specified mental disorders due to known physiological condition: Secondary | ICD-10-CM | POA: Diagnosis not present

## 2016-08-08 DIAGNOSIS — F068 Other specified mental disorders due to known physiological condition: Secondary | ICD-10-CM | POA: Diagnosis not present

## 2016-08-09 DIAGNOSIS — F068 Other specified mental disorders due to known physiological condition: Secondary | ICD-10-CM | POA: Diagnosis not present

## 2016-08-10 DIAGNOSIS — F068 Other specified mental disorders due to known physiological condition: Secondary | ICD-10-CM | POA: Diagnosis not present

## 2016-08-11 DIAGNOSIS — F068 Other specified mental disorders due to known physiological condition: Secondary | ICD-10-CM | POA: Diagnosis not present

## 2016-08-12 DIAGNOSIS — F068 Other specified mental disorders due to known physiological condition: Secondary | ICD-10-CM | POA: Diagnosis not present

## 2016-08-13 DIAGNOSIS — F068 Other specified mental disorders due to known physiological condition: Secondary | ICD-10-CM | POA: Diagnosis not present

## 2016-08-14 DIAGNOSIS — F068 Other specified mental disorders due to known physiological condition: Secondary | ICD-10-CM | POA: Diagnosis not present

## 2016-08-15 ENCOUNTER — Telehealth: Payer: Self-pay | Admitting: Pulmonary Disease

## 2016-08-15 DIAGNOSIS — F068 Other specified mental disorders due to known physiological condition: Secondary | ICD-10-CM | POA: Diagnosis not present

## 2016-08-15 NOTE — Telephone Encounter (Signed)
Spoke with pt she states she no longer wants to take the advair inhaler or the lasix and wanted [place] to send a letter to group home saying we gave the ok to take her off. Explained to the pt that we could not do that over the phone that she needs an ov to discuss this further. Pt made an appointment with TP. Nothing further is needed.

## 2016-08-16 DIAGNOSIS — F068 Other specified mental disorders due to known physiological condition: Secondary | ICD-10-CM | POA: Diagnosis not present

## 2016-08-17 DIAGNOSIS — F068 Other specified mental disorders due to known physiological condition: Secondary | ICD-10-CM | POA: Diagnosis not present

## 2016-08-20 DIAGNOSIS — F068 Other specified mental disorders due to known physiological condition: Secondary | ICD-10-CM | POA: Diagnosis not present

## 2016-08-21 DIAGNOSIS — F068 Other specified mental disorders due to known physiological condition: Secondary | ICD-10-CM | POA: Diagnosis not present

## 2016-08-22 DIAGNOSIS — F068 Other specified mental disorders due to known physiological condition: Secondary | ICD-10-CM | POA: Diagnosis not present

## 2016-08-23 DIAGNOSIS — F068 Other specified mental disorders due to known physiological condition: Secondary | ICD-10-CM | POA: Diagnosis not present

## 2016-08-24 DIAGNOSIS — F068 Other specified mental disorders due to known physiological condition: Secondary | ICD-10-CM | POA: Diagnosis not present

## 2016-08-25 DIAGNOSIS — F068 Other specified mental disorders due to known physiological condition: Secondary | ICD-10-CM | POA: Diagnosis not present

## 2016-08-26 DIAGNOSIS — F068 Other specified mental disorders due to known physiological condition: Secondary | ICD-10-CM | POA: Diagnosis not present

## 2016-08-27 DIAGNOSIS — F068 Other specified mental disorders due to known physiological condition: Secondary | ICD-10-CM | POA: Diagnosis not present

## 2016-08-28 ENCOUNTER — Ambulatory Visit: Payer: Medicare HMO | Admitting: Adult Health

## 2016-08-28 DIAGNOSIS — F068 Other specified mental disorders due to known physiological condition: Secondary | ICD-10-CM | POA: Diagnosis not present

## 2016-08-29 DIAGNOSIS — F068 Other specified mental disorders due to known physiological condition: Secondary | ICD-10-CM | POA: Diagnosis not present

## 2016-08-30 DIAGNOSIS — F068 Other specified mental disorders due to known physiological condition: Secondary | ICD-10-CM | POA: Diagnosis not present

## 2016-08-31 DIAGNOSIS — F068 Other specified mental disorders due to known physiological condition: Secondary | ICD-10-CM | POA: Diagnosis not present

## 2016-09-01 DIAGNOSIS — F068 Other specified mental disorders due to known physiological condition: Secondary | ICD-10-CM | POA: Diagnosis not present

## 2016-09-02 DIAGNOSIS — F068 Other specified mental disorders due to known physiological condition: Secondary | ICD-10-CM | POA: Diagnosis not present

## 2016-09-03 DIAGNOSIS — F068 Other specified mental disorders due to known physiological condition: Secondary | ICD-10-CM | POA: Diagnosis not present

## 2016-09-04 DIAGNOSIS — F068 Other specified mental disorders due to known physiological condition: Secondary | ICD-10-CM | POA: Diagnosis not present

## 2016-09-05 ENCOUNTER — Ambulatory Visit (INDEPENDENT_AMBULATORY_CARE_PROVIDER_SITE_OTHER): Payer: Medicare HMO | Admitting: Adult Health

## 2016-09-05 ENCOUNTER — Encounter: Payer: Self-pay | Admitting: Adult Health

## 2016-09-05 VITALS — BP 112/72 | HR 77 | Ht 64.0 in | Wt 174.2 lb

## 2016-09-05 DIAGNOSIS — I5033 Acute on chronic diastolic (congestive) heart failure: Secondary | ICD-10-CM | POA: Diagnosis not present

## 2016-09-05 DIAGNOSIS — J439 Emphysema, unspecified: Secondary | ICD-10-CM

## 2016-09-05 DIAGNOSIS — F068 Other specified mental disorders due to known physiological condition: Secondary | ICD-10-CM | POA: Diagnosis not present

## 2016-09-05 DIAGNOSIS — Z720 Tobacco use: Secondary | ICD-10-CM

## 2016-09-05 NOTE — Patient Instructions (Addendum)
Sarai January came to see our office today and we discussed her medications along with side effects and potential effects of stopping medications .  May stop Advair . If your breathing worsens or you start having more bronchitis /breathing problems then you are to return so we can look new medications .  Can Change Lasix 20mg  daily As needed  Leg swelling .  Work on quitting smoking . Try to cut back to 10 cigs a day , space them out .( or less )  ON return can look at going down slowly over next few months.  Follow up Dr. Elsworth Soho  In 3-4 months and As needed   Please contact office for sooner follow up if symptoms do not improve or worsen or seek emergency care

## 2016-09-05 NOTE — Assessment & Plan Note (Signed)
Smoking cessation  

## 2016-09-05 NOTE — Assessment & Plan Note (Signed)
Severe COPD w/ decline in lung functioin in active smoker  Despite multiple attempts to encouarge med complaince , pt does not want daily inhaler   Plan  Patient Instructions  Kim [name] came to see our office today and we discussed her medications along with side effects and potential effects of stopping medications .  May stop Advair . If your breathing worsens or you start having more bronchitis /breathing problems then you are to return so we can look new medications .  Can Change Lasix 20mg  daily As needed  Leg swelling .  Work on quitting smoking . Try to cut back to 10 cigs a day , space them out .( or less )  ON return can look at going down slowly over next few months.  Follow up Dr. Elsworth Soho  In 3-4 months and As needed   Please contact office for sooner follow up if symptoms do not improve or worsen or seek emergency care

## 2016-09-05 NOTE — Assessment & Plan Note (Signed)
Compensated without flare  May change lasix As needed

## 2016-09-05 NOTE — Progress Notes (Signed)
_0  ID: Kim [name], female    DOB: 12/10/1955, 61 y.o.   MRN: [id number]  Chief Complaint  Patient presents with  . Follow-up    COPD     Referring provider: Glendale Chard, MD  HPI: 61 yo female smoker followed for COPD  Hx of DVT prev on Xarelto x 6 mon . 2015 History of a left pleural effusion. 2010, most likely from a parapneumonic process.  She has Schizophrenia and lives in a group home.  TEST  She presented with Left Pleural effusion 07/2008 -Underwent Left thoracentesis x 1050 09/18/2008> ex with 79% lymphs, 16% eos WBC 3610 Last seen 6/10 >> eos 10.5 % with ESR only 17 not specific but this probably represents a late parapneumonic process with organization/ fibrotic change.   2015 2-D echo showed normal systolic function with an ejection fracture 55%, and a grade 1 diastolic dysfunction.   -Duplex 03/2014 LLE DVT Spirometry 2015  FEV1 69%, FVC 78%, ratio 71 2015 Did not desaturate on walking Spirometry 09/05/2016 FEV1 47%, ratio 62, FVC 59%   09/05/2016 Follow up : COPD //D CHF  Patient presents for a follow-up. She was last seen in the office in October 2016. Patient says that she has been doing okay with her breathing. She denies any cough or wheezing. She does take Advair. But says that she does not want to take this any longer. She wishes to have this medication removed from her medication list.. We went over her underlying diagnosis of COPD. Repeat spirometry today showed a decline in her FEV1 to 47%, ratio 62, FVC 59%. I discussed that her lung function has declined. She does continue to smoke. Smoking cessation was encouraged. Patient says that she is not short of breath and does not like taking inhalers. She also would like to have her Lasix change from daily to as needed. She says that she does not swell every day and does not want to take it on a regular basis. Patient would also like to have her Lasix  daily dosing to as needed dosing. As she says she does not  have swelling every day. She denies edema or orthopnea.    Allergies  Allergen Reactions  . Asa [Aspirin] Other (See Comments)    Stomach pain  . Metformin And Related Other (See Comments)    STOMACH PROBLEMS  . Penicillins Nausea And Vomiting    Large doses  . Quetiapine Other (See Comments)    REACTION: nausea/dizzy  . Acetaminophen Nausea And Vomiting and Rash    Immunization History  Administered Date(s) Administered  . Influenza,inj,Quad PF,36+ Mos 02/05/2014  . Pneumococcal Polysaccharide-23 02/05/2014    Past Medical History:  Diagnosis Date  . CHF (congestive heart failure) ([place])   . COPD (chronic obstructive pulmonary disease) ([place])   . Diabetes mellitus without complication ([place])   . GERD (gastroesophageal reflux disease)   . History of DVT (deep vein thrombosis)   . Hypercholesteremia   . Hyperlipemia   . Normal coronary arteries    by cardiac catheterization which I performed September 2015  . Schizophrenia ([place])   . Tobacco abuse     Tobacco History: History  Smoking Status  . Current Every Day Smoker  . Packs/day: 0.50  . Years: 21.00  . Types: Cigarettes  Smokeless Tobacco  . Never Used    Comment: smoking 1/2-1ppd    Ready to quit: No Counseling given: Yes   Outpatient Encounter Prescriptions as of 09/05/2016  Medication Sig  .  albuterol (PROVENTIL HFA;VENTOLIN HFA) 108 (90 BASE) MCG/ACT inhaler Inhale 2 puffs into the lungs every 6 (six) hours as needed for shortness of breath (Use as first line for wheezing / shortness of breath.).  [name] cholecalciferol (VITAMIN D) 1000 units tablet Take 1,000 Units by mouth daily.  . diclofenac sodium (VOLTAREN) 1 % GEL Apply 2 g topically 4 (four) times daily.  . furosemide (LASIX) 20 MG tablet TAKE 1 OR 2 TABLETS BY MOUTH AS NEEDED (Patient taking differently: TAKE 1 TABLET BY MOUTH ONCE DAILY)  . loratadine (CLARITIN) 10 MG tablet Take 10 mg by mouth daily.  . potassium chloride (K-DUR) 10 MEQ tablet Take  1 tablet (10 mEq total) by mouth daily. May take an extra potassium as needed.  . risperiDONE (RISPERDAL) 2 MG tablet Take 2 mg by mouth at bedtime.  . [DISCONTINUED] ADVAIR DISKUS 250-50 MCG/DOSE AEPB Inhale 1 puff into the lungs daily. (Patient not taking: Reported on 09/05/2016)   No facility-administered encounter medications on file as of 09/05/2016.      Review of Systems  Constitutional:   No  weight loss, night sweats,  Fevers, chills, fatigue, or  lassitude.  HEENT:   No headaches,  Difficulty swallowing,  Tooth/dental problems, or  Sore throat,                No sneezing, itching, ear ache, nasal congestion, post nasal drip,   CV:  No chest pain,  Orthopnea, PND, swelling in lower extremities, anasarca, dizziness, palpitations, syncope.   GI  No heartburn, indigestion, abdominal pain, nausea, vomiting, diarrhea, change in bowel habits, loss of appetite, bloody stools.   Resp: No shortness of breath with exertion or at rest.  No excess mucus, no productive cough,  No non-productive cough,  No coughing up of blood.  No change in color of mucus.  No wheezing.  No chest wall deformity  Skin: no rash or lesions.  GU: no dysuria, change in color of urine, no urgency or frequency.  No flank pain, no hematuria   MS:  No joint pain or swelling.  No decreased range of motion.  No back pain.    Physical Exam  BP 112/72 (BP Location: Left Arm, Cuff Size: Normal)   Pulse 77   Ht _0  (1.626 m)   Wt 174 lb 3.2 oz (79 kg)   SpO2 94%   BMI 29.90 kg/m   GEN: A/Ox3; pleasant , NAD, well nourished   HEENT:  [place]/AT,  EACs-clear, TMs-wnl, NOSE-clear, THROAT-clear, no lesions, no postnasal drip or exudate noted.   NECK:  Supple w/ fair ROM; no JVD; normal carotid impulses w/o bruits; no thyromegaly or nodules palpated; no lymphadenopathy.    RESP  Clear  P & A; w/o, wheezes/ rales/ or rhonchi. no accessory muscle use, no dullness to percussion  CARD:  RRR, no m/r/g, no peripheral  edema, pulses intact, no cyanosis or clubbing.  GI:   Soft & nt; nml bowel sounds; no organomegaly or masses detected.   Musco: Warm bil, no deformities or joint swelling noted.   Neuro: alert, no focal deficits noted.    Skin: Warm, no lesions or rashes    Lab Results:  CBC  BNP No results found for: BNP Imaging: No results found.   Assessment & Plan:   COPD with emphysema Severe COPD w/ decline in lung functioin in active smoker  Despite multiple attempts to encouarge med complaince , pt does not want daily inhaler   Plan  Patient Instructions  Kim [name] came to see our office today and we discussed her medications along with side effects and potential effects of stopping medications .  May stop Advair . If your breathing worsens or you start having more bronchitis /breathing problems then you are to return so we can look new medications .  Can Change Lasix 88m daily As needed  Leg swelling .  Work on quitting smoking . Try to cut back to 10 cigs a day , space them out .( or less )  ON return can look at going down slowly over next few months.  Follow up Dr. AElsworth Soho In 3-4 months and As needed   Please contact office for sooner follow up if symptoms do not improve or worsen or seek emergency care      Tobacco abuse Smoking cessation   Acute on chronic diastolic heart failure Compensated without flare  May change lasix As needed        TRexene Edison NP 09/05/2016

## 2016-09-06 DIAGNOSIS — F068 Other specified mental disorders due to known physiological condition: Secondary | ICD-10-CM | POA: Diagnosis not present

## 2016-09-07 DIAGNOSIS — I10 Essential (primary) hypertension: Secondary | ICD-10-CM | POA: Diagnosis not present

## 2016-09-07 DIAGNOSIS — E782 Mixed hyperlipidemia: Secondary | ICD-10-CM | POA: Diagnosis not present

## 2016-09-07 DIAGNOSIS — R7303 Prediabetes: Secondary | ICD-10-CM | POA: Diagnosis not present

## 2016-09-07 DIAGNOSIS — F068 Other specified mental disorders due to known physiological condition: Secondary | ICD-10-CM | POA: Diagnosis not present

## 2016-09-08 DIAGNOSIS — F068 Other specified mental disorders due to known physiological condition: Secondary | ICD-10-CM | POA: Diagnosis not present

## 2016-09-09 DIAGNOSIS — F068 Other specified mental disorders due to known physiological condition: Secondary | ICD-10-CM | POA: Diagnosis not present

## 2016-09-10 DIAGNOSIS — F068 Other specified mental disorders due to known physiological condition: Secondary | ICD-10-CM | POA: Diagnosis not present

## 2016-09-11 DIAGNOSIS — F068 Other specified mental disorders due to known physiological condition: Secondary | ICD-10-CM | POA: Diagnosis not present

## 2016-09-12 DIAGNOSIS — F068 Other specified mental disorders due to known physiological condition: Secondary | ICD-10-CM | POA: Diagnosis not present

## 2016-09-13 DIAGNOSIS — F068 Other specified mental disorders due to known physiological condition: Secondary | ICD-10-CM | POA: Diagnosis not present

## 2016-09-14 DIAGNOSIS — F068 Other specified mental disorders due to known physiological condition: Secondary | ICD-10-CM | POA: Diagnosis not present

## 2016-09-15 DIAGNOSIS — F068 Other specified mental disorders due to known physiological condition: Secondary | ICD-10-CM | POA: Diagnosis not present

## 2016-09-16 DIAGNOSIS — F068 Other specified mental disorders due to known physiological condition: Secondary | ICD-10-CM | POA: Diagnosis not present

## 2016-09-17 DIAGNOSIS — F068 Other specified mental disorders due to known physiological condition: Secondary | ICD-10-CM | POA: Diagnosis not present

## 2016-09-18 ENCOUNTER — Ambulatory Visit (HOSPITAL_COMMUNITY)
Admission: EM | Admit: 2016-09-18 | Discharge: 2016-09-18 | Disposition: A | Discharge status: Home / Self Care | Payer: Medicare HMO | Attending: Internal Medicine | Admitting: Internal Medicine

## 2016-09-18 ENCOUNTER — Ambulatory Visit (INDEPENDENT_AMBULATORY_CARE_PROVIDER_SITE_OTHER): Payer: Medicare HMO

## 2016-09-18 ENCOUNTER — Encounter (HOSPITAL_COMMUNITY): Payer: Self-pay | Admitting: Emergency Medicine

## 2016-09-18 DIAGNOSIS — W19XXXA Unspecified fall, initial encounter: Secondary | ICD-10-CM | POA: Diagnosis not present

## 2016-09-18 DIAGNOSIS — M7989 Other specified soft tissue disorders: Secondary | ICD-10-CM | POA: Diagnosis not present

## 2016-09-18 DIAGNOSIS — Y9355 Activity, bike riding: Secondary | ICD-10-CM

## 2016-09-18 DIAGNOSIS — S8012XA Contusion of left lower leg, initial encounter: Secondary | ICD-10-CM | POA: Diagnosis not present

## 2016-09-18 DIAGNOSIS — S81802A Unspecified open wound, left lower leg, initial encounter: Secondary | ICD-10-CM

## 2016-09-18 MED ORDER — CEPHALEXIN 500 MG PO CAPS: 500.0000 mg | ORAL_CAPSULE | Freq: Four times a day (QID) | ORAL | 0 refills | Status: DC

## 2016-09-18 NOTE — Discharge Instructions (Signed)
Keep your left leg elevated, place ice over the top of your leg often home. Take the antibiotic as directed. Return tomorrow afternoon for evaluation. If, in the meantime that she developed much more pain to the leg, more swelling or worsening code directly to the emergency department.
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
## 2016-09-18 NOTE — Progress Notes (Signed)
Reviewed & agree with plan  

## 2016-09-19 ENCOUNTER — Ambulatory Visit (HOSPITAL_COMMUNITY)
Admission: EM | Admit: 2016-09-19 | Discharge: 2016-09-19 | Disposition: A | Discharge status: Home / Self Care | Payer: Medicare HMO

## 2016-09-19 ENCOUNTER — Encounter (HOSPITAL_COMMUNITY): Payer: Self-pay | Admitting: *Deleted

## 2016-09-19 DIAGNOSIS — F068 Other specified mental disorders due to known physiological condition: Secondary | ICD-10-CM | POA: Diagnosis not present

## 2016-09-19 DIAGNOSIS — S8012XA Contusion of left lower leg, initial encounter: Secondary | ICD-10-CM | POA: Diagnosis not present
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
## 2016-09-19 NOTE — Discharge Instructions (Signed)
Please stay off leg and keep elevated for next 2 days.
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
## 2016-09-20 DIAGNOSIS — F068 Other specified mental disorders due to known physiological condition: Secondary | ICD-10-CM | POA: Diagnosis not present

## 2016-09-21 DIAGNOSIS — F068 Other specified mental disorders due to known physiological condition: Secondary | ICD-10-CM | POA: Diagnosis not present

## 2016-09-22 DIAGNOSIS — F068 Other specified mental disorders due to known physiological condition: Secondary | ICD-10-CM | POA: Diagnosis not present

## 2016-09-23 DIAGNOSIS — F068 Other specified mental disorders due to known physiological condition: Secondary | ICD-10-CM | POA: Diagnosis not present

## 2016-09-24 DIAGNOSIS — F068 Other specified mental disorders due to known physiological condition: Secondary | ICD-10-CM | POA: Diagnosis not present

## 2016-09-25 DIAGNOSIS — F068 Other specified mental disorders due to known physiological condition: Secondary | ICD-10-CM | POA: Diagnosis not present

## 2016-09-26 DIAGNOSIS — F068 Other specified mental disorders due to known physiological condition: Secondary | ICD-10-CM | POA: Diagnosis not present

## 2016-09-27 DIAGNOSIS — F068 Other specified mental disorders due to known physiological condition: Secondary | ICD-10-CM | POA: Diagnosis not present

## 2016-09-27 DIAGNOSIS — F209 Schizophrenia, unspecified: Secondary | ICD-10-CM | POA: Diagnosis not present

## 2016-09-28 DIAGNOSIS — F068 Other specified mental disorders due to known physiological condition: Secondary | ICD-10-CM | POA: Diagnosis not present

## 2016-09-29 DIAGNOSIS — F068 Other specified mental disorders due to known physiological condition: Secondary | ICD-10-CM | POA: Diagnosis not present

## 2016-09-30 DIAGNOSIS — F068 Other specified mental disorders due to known physiological condition: Secondary | ICD-10-CM | POA: Diagnosis not present

## 2016-10-01 DIAGNOSIS — F068 Other specified mental disorders due to known physiological condition: Secondary | ICD-10-CM | POA: Diagnosis not present

## 2016-10-02 DIAGNOSIS — F068 Other specified mental disorders due to known physiological condition: Secondary | ICD-10-CM | POA: Diagnosis not present

## 2016-10-03 DIAGNOSIS — F068 Other specified mental disorders due to known physiological condition: Secondary | ICD-10-CM | POA: Diagnosis not present

## 2016-10-04 DIAGNOSIS — F068 Other specified mental disorders due to known physiological condition: Secondary | ICD-10-CM | POA: Diagnosis not present

## 2016-10-05 DIAGNOSIS — F068 Other specified mental disorders due to known physiological condition: Secondary | ICD-10-CM | POA: Diagnosis not present

## 2016-10-06 DIAGNOSIS — F068 Other specified mental disorders due to known physiological condition: Secondary | ICD-10-CM | POA: Diagnosis not present

## 2016-10-07 DIAGNOSIS — F068 Other specified mental disorders due to known physiological condition: Secondary | ICD-10-CM | POA: Diagnosis not present

## 2016-10-08 DIAGNOSIS — F068 Other specified mental disorders due to known physiological condition: Secondary | ICD-10-CM | POA: Diagnosis not present

## 2016-10-09 DIAGNOSIS — F068 Other specified mental disorders due to known physiological condition: Secondary | ICD-10-CM | POA: Diagnosis not present

## 2016-10-10 DIAGNOSIS — F068 Other specified mental disorders due to known physiological condition: Secondary | ICD-10-CM | POA: Diagnosis not present

## 2016-10-11 DIAGNOSIS — F068 Other specified mental disorders due to known physiological condition: Secondary | ICD-10-CM | POA: Diagnosis not present

## 2016-10-12 DIAGNOSIS — F068 Other specified mental disorders due to known physiological condition: Secondary | ICD-10-CM | POA: Diagnosis not present

## 2016-10-13 DIAGNOSIS — F068 Other specified mental disorders due to known physiological condition: Secondary | ICD-10-CM | POA: Diagnosis not present

## 2016-10-14 DIAGNOSIS — F068 Other specified mental disorders due to known physiological condition: Secondary | ICD-10-CM | POA: Diagnosis not present

## 2016-10-15 DIAGNOSIS — F068 Other specified mental disorders due to known physiological condition: Secondary | ICD-10-CM | POA: Diagnosis not present

## 2016-10-16 DIAGNOSIS — F068 Other specified mental disorders due to known physiological condition: Secondary | ICD-10-CM | POA: Diagnosis not present

## 2016-10-17 ENCOUNTER — Ambulatory Visit (HOSPITAL_COMMUNITY)
Admission: EM | Admit: 2016-10-17 | Discharge: 2016-10-17 | Disposition: A | Discharge status: Home / Self Care | Payer: Medicare HMO | Attending: Family Medicine | Admitting: Family Medicine

## 2016-10-17 ENCOUNTER — Encounter (HOSPITAL_COMMUNITY): Payer: Self-pay | Admitting: *Deleted

## 2016-10-17 DIAGNOSIS — R600 Localized edema: Secondary | ICD-10-CM

## 2016-10-17 DIAGNOSIS — Z5189 Encounter for other specified aftercare: Secondary | ICD-10-CM

## 2016-10-17 DIAGNOSIS — F068 Other specified mental disorders due to known physiological condition: Secondary | ICD-10-CM | POA: Diagnosis not present
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
## 2016-10-17 NOTE — Discharge Instructions (Signed)
Take the next 3 days off work and keep her left leg elevated. When you for up and walking about at home and at work apply your compression socks. Limit the amount of time you or ambulating or standing as much as possible for the next week or so. If you develop increasing redness, pain or swelling seek medical attention promptly.

## 2016-10-20 DIAGNOSIS — F068 Other specified mental disorders due to known physiological condition: Secondary | ICD-10-CM | POA: Diagnosis not present

## 2016-10-21 DIAGNOSIS — F068 Other specified mental disorders due to known physiological condition: Secondary | ICD-10-CM | POA: Diagnosis not present

## 2016-10-22 DIAGNOSIS — F068 Other specified mental disorders due to known physiological condition: Secondary | ICD-10-CM | POA: Diagnosis not present

## 2016-10-23 DIAGNOSIS — F068 Other specified mental disorders due to known physiological condition: Secondary | ICD-10-CM | POA: Diagnosis not present

## 2016-10-24 DIAGNOSIS — F068 Other specified mental disorders due to known physiological condition: Secondary | ICD-10-CM | POA: Diagnosis not present

## 2016-10-25 DIAGNOSIS — F068 Other specified mental disorders due to known physiological condition: Secondary | ICD-10-CM | POA: Diagnosis not present

## 2016-10-26 DIAGNOSIS — F068 Other specified mental disorders due to known physiological condition: Secondary | ICD-10-CM | POA: Diagnosis not present

## 2016-10-27 DIAGNOSIS — F068 Other specified mental disorders due to known physiological condition: Secondary | ICD-10-CM | POA: Diagnosis not present

## 2016-10-28 DIAGNOSIS — F068 Other specified mental disorders due to known physiological condition: Secondary | ICD-10-CM | POA: Diagnosis not present

## 2016-10-29 DIAGNOSIS — F068 Other specified mental disorders due to known physiological condition: Secondary | ICD-10-CM | POA: Diagnosis not present

## 2016-10-30 DIAGNOSIS — F068 Other specified mental disorders due to known physiological condition: Secondary | ICD-10-CM | POA: Diagnosis not present

## 2016-10-31 DIAGNOSIS — F068 Other specified mental disorders due to known physiological condition: Secondary | ICD-10-CM | POA: Diagnosis not present

## 2016-11-01 DIAGNOSIS — F068 Other specified mental disorders due to known physiological condition: Secondary | ICD-10-CM | POA: Diagnosis not present

## 2016-11-02 ENCOUNTER — Telehealth: Payer: Self-pay | Admitting: Adult Health

## 2016-11-02 DIAGNOSIS — F068 Other specified mental disorders due to known physiological condition: Secondary | ICD-10-CM | POA: Diagnosis not present

## 2016-11-02 NOTE — Telephone Encounter (Signed)
TP  Please Advise-  Pt is no longer in the lobby. You last saw the pt on 09/05/16. Pt came to office requesting a letter to state that she does not need to use her claritin. Informed the pt that claritin is OTC and and it is her choice if she chooses to take it or not. She stated she does not wish to take it and that she lives in a group home and they request a letter for this. It does not look like we prescribed this, this looks like it was added to her list during visit. Informed pt unsure if a letter could be done but she wanted to know if you were willing to write this.   FYI to nurse: If TP agree to letter pt was unsure of fax number gave number [phone] to speak with Regin[place] Agent

## 2016-11-02 NOTE — Telephone Encounter (Signed)
lmom tcb x1 to pt  

## 2016-11-02 NOTE — Telephone Encounter (Signed)
Per TP: okay to change Claritin to as needed.

## 2016-11-03 DIAGNOSIS — F068 Other specified mental disorders due to known physiological condition: Secondary | ICD-10-CM | POA: Diagnosis not present

## 2016-11-03 NOTE — Telephone Encounter (Signed)
lmtcb x2 for pt. 

## 2016-11-04 DIAGNOSIS — F068 Other specified mental disorders due to known physiological condition: Secondary | ICD-10-CM | POA: Diagnosis not present

## 2016-11-05 DIAGNOSIS — F068 Other specified mental disorders due to known physiological condition: Secondary | ICD-10-CM | POA: Diagnosis not present

## 2016-11-06 DIAGNOSIS — F068 Other specified mental disorders due to known physiological condition: Secondary | ICD-10-CM | POA: Diagnosis not present

## 2016-11-06 NOTE — Telephone Encounter (Signed)
lmtcb x3 for pt. 

## 2016-11-07 DIAGNOSIS — F068 Other specified mental disorders due to known physiological condition: Secondary | ICD-10-CM | POA: Diagnosis not present

## 2016-11-07 NOTE — Telephone Encounter (Signed)
We have attempted to contact pt several times with no answer and no call back. Per triage protocol, message will be closed.

## 2016-11-08 ENCOUNTER — Telehealth: Payer: Self-pay | Admitting: Adult Health

## 2016-11-08 DIAGNOSIS — F068 Other specified mental disorders due to known physiological condition: Secondary | ICD-10-CM | POA: Diagnosis not present

## 2016-11-08 NOTE — Telephone Encounter (Signed)
Left message for patient to call back tomorrow morning.  

## 2016-11-09 DIAGNOSIS — F068 Other specified mental disorders due to known physiological condition: Secondary | ICD-10-CM | POA: Diagnosis not present

## 2016-11-10 DIAGNOSIS — F068 Other specified mental disorders due to known physiological condition: Secondary | ICD-10-CM | POA: Diagnosis not present

## 2016-11-10 NOTE — Telephone Encounter (Signed)
lmtcb x2 for pt. 

## 2016-11-11 DIAGNOSIS — F068 Other specified mental disorders due to known physiological condition: Secondary | ICD-10-CM | POA: Diagnosis not present

## 2016-11-12 DIAGNOSIS — F068 Other specified mental disorders due to known physiological condition: Secondary | ICD-10-CM | POA: Diagnosis not present

## 2016-11-13 ENCOUNTER — Telehealth: Payer: Self-pay | Admitting: Adult Health

## 2016-11-13 DIAGNOSIS — F068 Other specified mental disorders due to known physiological condition: Secondary | ICD-10-CM | POA: Diagnosis not present

## 2016-11-13 NOTE — Telephone Encounter (Signed)
Pt in office requesting letter stating our office d/c Advair, Claritin and Lasix, due to living in group home.  Pt states this letter is needed to present to the state.  Per TP last OV on 09/06/15, pt was instructed to d/c Advair and continue Lasix 20mg  as needed. Per phone note on 11/02/16 TP states for pt to take Claritin 10mg  as needed.  RB agreed to write and sign letter stating the above, as DOD. Pt has been provided letter. Nothing further needed.

## 2016-11-13 NOTE — Telephone Encounter (Signed)
See phone note from 11/13/16. Patient walked into the office for the same matter.

## 2016-11-14 DIAGNOSIS — F068 Other specified mental disorders due to known physiological condition: Secondary | ICD-10-CM | POA: Diagnosis not present

## 2016-11-15 DIAGNOSIS — F068 Other specified mental disorders due to known physiological condition: Secondary | ICD-10-CM | POA: Diagnosis not present

## 2016-11-16 DIAGNOSIS — F068 Other specified mental disorders due to known physiological condition: Secondary | ICD-10-CM | POA: Diagnosis not present

## 2016-11-17 DIAGNOSIS — F068 Other specified mental disorders due to known physiological condition: Secondary | ICD-10-CM | POA: Diagnosis not present

## 2016-11-19 DIAGNOSIS — F068 Other specified mental disorders due to known physiological condition: Secondary | ICD-10-CM | POA: Diagnosis not present

## 2016-11-20 DIAGNOSIS — F068 Other specified mental disorders due to known physiological condition: Secondary | ICD-10-CM | POA: Diagnosis not present

## 2016-11-21 DIAGNOSIS — F068 Other specified mental disorders due to known physiological condition: Secondary | ICD-10-CM | POA: Diagnosis not present

## 2016-11-22 DIAGNOSIS — F068 Other specified mental disorders due to known physiological condition: Secondary | ICD-10-CM | POA: Diagnosis not present

## 2016-11-23 DIAGNOSIS — F068 Other specified mental disorders due to known physiological condition: Secondary | ICD-10-CM | POA: Diagnosis not present

## 2016-11-24 DIAGNOSIS — F068 Other specified mental disorders due to known physiological condition: Secondary | ICD-10-CM | POA: Diagnosis not present

## 2016-11-25 DIAGNOSIS — F068 Other specified mental disorders due to known physiological condition: Secondary | ICD-10-CM | POA: Diagnosis not present

## 2016-11-26 DIAGNOSIS — F068 Other specified mental disorders due to known physiological condition: Secondary | ICD-10-CM | POA: Diagnosis not present

## 2016-11-27 DIAGNOSIS — F068 Other specified mental disorders due to known physiological condition: Secondary | ICD-10-CM | POA: Diagnosis not present

## 2016-11-28 DIAGNOSIS — F068 Other specified mental disorders due to known physiological condition: Secondary | ICD-10-CM | POA: Diagnosis not present

## 2016-11-29 DIAGNOSIS — F068 Other specified mental disorders due to known physiological condition: Secondary | ICD-10-CM | POA: Diagnosis not present

## 2016-11-30 DIAGNOSIS — N390 Urinary tract infection, site not specified: Secondary | ICD-10-CM | POA: Diagnosis not present

## 2016-11-30 DIAGNOSIS — J449 Chronic obstructive pulmonary disease, unspecified: Secondary | ICD-10-CM | POA: Diagnosis not present

## 2016-11-30 DIAGNOSIS — R7303 Prediabetes: Secondary | ICD-10-CM | POA: Diagnosis not present

## 2016-11-30 DIAGNOSIS — F068 Other specified mental disorders due to known physiological condition: Secondary | ICD-10-CM | POA: Diagnosis not present

## 2016-11-30 DIAGNOSIS — E782 Mixed hyperlipidemia: Secondary | ICD-10-CM | POA: Diagnosis not present

## 2016-11-30 DIAGNOSIS — I10 Essential (primary) hypertension: Secondary | ICD-10-CM | POA: Diagnosis not present

## 2016-11-30 DIAGNOSIS — R350 Frequency of micturition: Secondary | ICD-10-CM | POA: Diagnosis not present

## 2016-12-01 DIAGNOSIS — F068 Other specified mental disorders due to known physiological condition: Secondary | ICD-10-CM | POA: Diagnosis not present

## 2016-12-02 DIAGNOSIS — F068 Other specified mental disorders due to known physiological condition: Secondary | ICD-10-CM | POA: Diagnosis not present

## 2016-12-03 DIAGNOSIS — F068 Other specified mental disorders due to known physiological condition: Secondary | ICD-10-CM | POA: Diagnosis not present

## 2016-12-04 DIAGNOSIS — F068 Other specified mental disorders due to known physiological condition: Secondary | ICD-10-CM | POA: Diagnosis not present

## 2016-12-05 DIAGNOSIS — F068 Other specified mental disorders due to known physiological condition: Secondary | ICD-10-CM | POA: Diagnosis not present

## 2016-12-06 DIAGNOSIS — F068 Other specified mental disorders due to known physiological condition: Secondary | ICD-10-CM | POA: Diagnosis not present

## 2016-12-07 DIAGNOSIS — F068 Other specified mental disorders due to known physiological condition: Secondary | ICD-10-CM | POA: Diagnosis not present

## 2016-12-08 DIAGNOSIS — F068 Other specified mental disorders due to known physiological condition: Secondary | ICD-10-CM | POA: Diagnosis not present

## 2016-12-09 DIAGNOSIS — F068 Other specified mental disorders due to known physiological condition: Secondary | ICD-10-CM | POA: Diagnosis not present

## 2016-12-10 DIAGNOSIS — F068 Other specified mental disorders due to known physiological condition: Secondary | ICD-10-CM | POA: Diagnosis not present

## 2016-12-11 DIAGNOSIS — F068 Other specified mental disorders due to known physiological condition: Secondary | ICD-10-CM | POA: Diagnosis not present

## 2016-12-12 DIAGNOSIS — F068 Other specified mental disorders due to known physiological condition: Secondary | ICD-10-CM | POA: Diagnosis not present

## 2016-12-13 DIAGNOSIS — F068 Other specified mental disorders due to known physiological condition: Secondary | ICD-10-CM | POA: Diagnosis not present

## 2016-12-14 DIAGNOSIS — F068 Other specified mental disorders due to known physiological condition: Secondary | ICD-10-CM | POA: Diagnosis not present

## 2016-12-15 DIAGNOSIS — F068 Other specified mental disorders due to known physiological condition: Secondary | ICD-10-CM | POA: Diagnosis not present

## 2016-12-16 DIAGNOSIS — F068 Other specified mental disorders due to known physiological condition: Secondary | ICD-10-CM | POA: Diagnosis not present

## 2016-12-17 DIAGNOSIS — F068 Other specified mental disorders due to known physiological condition: Secondary | ICD-10-CM | POA: Diagnosis not present

## 2016-12-18 ENCOUNTER — Ambulatory Visit (INDEPENDENT_AMBULATORY_CARE_PROVIDER_SITE_OTHER): Payer: Medicare HMO | Admitting: Pulmonary Disease

## 2016-12-18 ENCOUNTER — Encounter: Payer: Self-pay | Admitting: Pulmonary Disease

## 2016-12-18 VITALS — BP 118/70 | HR 71 | Ht 64.0 in | Wt 174.0 lb

## 2016-12-18 DIAGNOSIS — F209 Schizophrenia, unspecified: Secondary | ICD-10-CM | POA: Diagnosis not present

## 2016-12-18 DIAGNOSIS — J439 Emphysema, unspecified: Secondary | ICD-10-CM | POA: Diagnosis not present

## 2016-12-18 DIAGNOSIS — Z720 Tobacco use: Secondary | ICD-10-CM | POA: Diagnosis not present

## 2016-12-18 NOTE — Assessment & Plan Note (Signed)
Again, smoking cessation primary intervention here

## 2016-12-18 NOTE — Assessment & Plan Note (Addendum)
have to focus on stopping smoking. If you're successful in doing so, you can stop using Advair and Spiriva She does not want to use his medications and [facility]iber to give her prescription she would not take these  Continue albuterol as needed

## 2016-12-18 NOTE — Progress Notes (Signed)
   Subjective:    Patient ID: TRUDA STAUB, female    DOB: 19-Jul-1955, 61 y.o.   MRN: [id number]  HPI   61 yo  Smoker , Group home resident with schizophrenia presents for FU of COPD  Hx of DVT prev on Xarelto x 6 mon . 2015 History of a left pleural effusion. 2010, most likely from a parapneumonic process.   She continues to smoke about half pack per day and states that she is unable to quit. She feels like Advair and Spiriva helped her 3 months ago when she was congested but now she feels better and would like to stop taking these medications. She feels like her before continues to help & is okay with using that  She goes to [place] and sees psychiatrist and is compliant with her Risperdal. She denies hearing voices but does not feel like her usual self  Spirometry today shows ratio 76 with FEV1 of 50% and FVC of 51% consistent with moderate restriction   Significant tests/ events reviewed   07/2008 - Left thoracentesis x 1050 09/18/2008> ex with 79% lymphs, 16% eos WBC 3610 .  2015 2-D echo showed normal systolic function with an ejection fracture 55%, and a grade 1 diastolic dysfunction.  -Duplex 03/2014 LLE DVT Spirometry 2015 FEV1 69%, FVC 78%, ratio 71  Spirometry 09/05/2016 FEV1 47%, ratio 62, FVC 59%       Review of Systems neg for any significant sore throat, dysphagia, itching, sneezing, nasal congestion or excess/ purulent secretions, fever, chills, sweats, unintended wt loss, pleuritic or exertional cp, hempoptysis, orthopnea pnd or change in chronic leg swelling.   Also denies presyncope, palpitations, heartburn, abdominal pain, nausea, vomiting, diarrhea or change in bowel or urinary habits, dysuria,hematuria, rash, arthralgias, visual complaints, headache, numbness weakness or ataxia.     Objective:   Physical Exam  Gen. Pleasant, obese, in no distress ENT - no lesions, no post nasal drip Neck: No JVD, no thyromegaly, no carotid bruits Lungs: no use  of accessory muscles, no dullness to percussion, decreased without rales or rhonchi  Cardiovascular: Rhythm regular, heart sounds  normal, no murmurs or gallops, no peripheral edema Musculoskeletal: No deformities, no cyanosis or clubbing , no tremors       Assessment & Plan:

## 2016-12-18 NOTE — Patient Instructions (Signed)
You have to focus on stopping smoking. If you're successful in doing so, you can stop using Advair and Spiriva

## 2016-12-20 DIAGNOSIS — F068 Other specified mental disorders due to known physiological condition: Secondary | ICD-10-CM | POA: Diagnosis not present

## 2016-12-21 DIAGNOSIS — F068 Other specified mental disorders due to known physiological condition: Secondary | ICD-10-CM | POA: Diagnosis not present

## 2016-12-22 DIAGNOSIS — F068 Other specified mental disorders due to known physiological condition: Secondary | ICD-10-CM | POA: Diagnosis not present

## 2016-12-23 DIAGNOSIS — F068 Other specified mental disorders due to known physiological condition: Secondary | ICD-10-CM | POA: Diagnosis not present

## 2016-12-24 DIAGNOSIS — F068 Other specified mental disorders due to known physiological condition: Secondary | ICD-10-CM | POA: Diagnosis not present

## 2016-12-25 DIAGNOSIS — F068 Other specified mental disorders due to known physiological condition: Secondary | ICD-10-CM | POA: Diagnosis not present

## 2016-12-26 DIAGNOSIS — F068 Other specified mental disorders due to known physiological condition: Secondary | ICD-10-CM | POA: Diagnosis not present

## 2016-12-27 DIAGNOSIS — F068 Other specified mental disorders due to known physiological condition: Secondary | ICD-10-CM | POA: Diagnosis not present

## 2016-12-28 DIAGNOSIS — F068 Other specified mental disorders due to known physiological condition: Secondary | ICD-10-CM | POA: Diagnosis not present

## 2016-12-29 ENCOUNTER — Ambulatory Visit (HOSPITAL_COMMUNITY)
Admission: EM | Admit: 2016-12-29 | Discharge: 2016-12-29 | Disposition: A | Discharge status: Home / Self Care | Payer: Medicare HMO | Attending: Emergency Medicine | Admitting: Emergency Medicine

## 2016-12-29 ENCOUNTER — Encounter (HOSPITAL_COMMUNITY): Payer: Self-pay | Admitting: Emergency Medicine

## 2016-12-29 DIAGNOSIS — F068 Other specified mental disorders due to known physiological condition: Secondary | ICD-10-CM | POA: Diagnosis not present

## 2016-12-29 DIAGNOSIS — L03116 Cellulitis of left lower limb: Secondary | ICD-10-CM | POA: Diagnosis not present

## 2016-12-29 MED ORDER — DOXYCYCLINE HYCLATE 100 MG PO CAPS: 100.0000 mg | ORAL_CAPSULE | Freq: Two times a day (BID) | ORAL | 0 refills | Status: DC

## 2016-12-29 MED ORDER — LIDOCAINE HCL (PF) 1 % IJ SOLN
INTRAMUSCULAR | Status: AC
  Filled 2016-12-29: qty 2

## 2016-12-29 MED ORDER — CEFTRIAXONE SODIUM 1 G IJ SOLR
1.0000 g | Freq: Once | INTRAMUSCULAR | Status: AC
  Administered 2016-12-29: 1 g via INTRAMUSCULAR

## 2016-12-29 MED ORDER — CEFTRIAXONE SODIUM 1 G IJ SOLR
INTRAMUSCULAR | Status: AC
Start: 2016-12-29 — End: 2016-12-29
  Filled 2016-12-29: qty 10

## 2016-12-29 NOTE — ED Provider Notes (Signed)
  [place]   [id number] 12/29/16 Arrival Time: 8177  ASSESSMENT & PLAN:  1. Cellulitis of left lower extremity     Meds ordered this encounter  Medications  . cefTRIAXone (ROCEPHIN) injection 1 g  . doxycycline (VIBRAMYCIN) 100 MG capsule    Sig: Take 1 capsule (100 mg total) by mouth 2 (two) times daily.    Dispense:  20 capsule    Refill:  0    Order Specific Question:   Supervising Provider    Answer:   Vanessa Kick [[id number]]   We'll start therapy for both MSSA, MRSA, and strep. Advised to go to the ER if symptoms worsen, otherwise return to clinic in 5 days if no improvement  Reviewed expectations re: course of current medical issues. Questions answered. Outlined signs and symptoms indicating need for more acute intervention. Patient verbalized understanding. After Visit Summary given.   SUBJECTIVE:  Kim [name] is a 61 y.o. female who presents with complaint of Redness, swelling, and pain to her left lower leg. She has previously been seen in the clinic and evaluated for this in the past, states that it had "completely healed" until 5 days ago when it began to worsen. She denies any fever or chills. She is a diabetic, but states she is well controlled. Has no nausea, vomiting, chest pain or palpitations, shortness of breath, change in appetite, weakness, or other significant complaints. She works at a [employer]  ROS: As per HPI, remainder of ROS negative.   OBJECTIVE:  Vitals:   12/29/16 1745  BP: 137/84  Pulse: 74  Resp: 18  Temp: 98.7 F (37.1 C)  TempSrc: Oral  SpO2: 94%     General appearance: alert; no distress HEENT: normocephalic; atraumatic; conjunctivae normal;  Lungs: clear to auscultation bilaterally Heart: regular rate and rhythm Abdomen: soft, non-tender;  Back: no CVA tenderness Extremities: no cyanosis or edema; symmetrical with no gross deformities, area of erythema, and tenderness, see attached  photograph Skin: warm and dry Neurologic: Grossly normal Psychological:  alert and cooperative; normal mood and affect      Procedures:     Labs Reviewed - No data to display  No results found.  Allergies  Allergen Reactions  . Asa [Aspirin] Other (See Comments)    Stomach pain  . Metformin And Related Other (See Comments)    STOMACH PROBLEMS  . Penicillins Nausea And Vomiting    Large doses  . Quetiapine Other (See Comments)    REACTION: nausea/dizzy  . Acetaminophen Nausea And Vomiting and Rash    PMHx, SurgHx, SocialHx, Medications, and Allergies were reviewed in the Visit Navigator and updated as appropriate.       Barnet Glasgow, NP 12/29/16 [phone]

## 2016-12-29 NOTE — Discharge Instructions (Signed)
For your cellulitis, we have given you rocephin here in clinic and I have started you on Doxycycline. Take one tablet twice a day. If you see red streaking up you leg, go to the ER as soon as possible, if it persists, return to clinic or follow up with your primary care provider. If you develop fever or have other worsening symptoms, return to clinic sooner

## 2016-12-29 NOTE — ED Triage Notes (Signed)
The patient presented to the [facility] with a complaint of left lower leg pain and redness with swelling x 5 days.

## 2016-12-30 DIAGNOSIS — F068 Other specified mental disorders due to known physiological condition: Secondary | ICD-10-CM | POA: Diagnosis not present

## 2016-12-31 DIAGNOSIS — F068 Other specified mental disorders due to known physiological condition: Secondary | ICD-10-CM | POA: Diagnosis not present

## 2017-01-01 DIAGNOSIS — F068 Other specified mental disorders due to known physiological condition: Secondary | ICD-10-CM | POA: Diagnosis not present

## 2017-01-02 DIAGNOSIS — F068 Other specified mental disorders due to known physiological condition: Secondary | ICD-10-CM | POA: Diagnosis not present

## 2017-01-03 DIAGNOSIS — F068 Other specified mental disorders due to known physiological condition: Secondary | ICD-10-CM | POA: Diagnosis not present

## 2017-01-04 DIAGNOSIS — F068 Other specified mental disorders due to known physiological condition: Secondary | ICD-10-CM | POA: Diagnosis not present

## 2017-01-05 DIAGNOSIS — F068 Other specified mental disorders due to known physiological condition: Secondary | ICD-10-CM | POA: Diagnosis not present

## 2017-01-06 DIAGNOSIS — F068 Other specified mental disorders due to known physiological condition: Secondary | ICD-10-CM | POA: Diagnosis not present

## 2017-01-07 DIAGNOSIS — F068 Other specified mental disorders due to known physiological condition: Secondary | ICD-10-CM | POA: Diagnosis not present

## 2017-01-08 DIAGNOSIS — F068 Other specified mental disorders due to known physiological condition: Secondary | ICD-10-CM | POA: Diagnosis not present

## 2017-01-09 DIAGNOSIS — F068 Other specified mental disorders due to known physiological condition: Secondary | ICD-10-CM | POA: Diagnosis not present

## 2017-01-10 DIAGNOSIS — F068 Other specified mental disorders due to known physiological condition: Secondary | ICD-10-CM | POA: Diagnosis not present

## 2017-01-11 DIAGNOSIS — F068 Other specified mental disorders due to known physiological condition: Secondary | ICD-10-CM | POA: Diagnosis not present

## 2017-01-12 DIAGNOSIS — F068 Other specified mental disorders due to known physiological condition: Secondary | ICD-10-CM | POA: Diagnosis not present

## 2017-01-13 DIAGNOSIS — F068 Other specified mental disorders due to known physiological condition: Secondary | ICD-10-CM | POA: Diagnosis not present

## 2017-01-14 DIAGNOSIS — F068 Other specified mental disorders due to known physiological condition: Secondary | ICD-10-CM | POA: Diagnosis not present

## 2017-01-15 DIAGNOSIS — F068 Other specified mental disorders due to known physiological condition: Secondary | ICD-10-CM | POA: Diagnosis not present

## 2017-01-16 DIAGNOSIS — F068 Other specified mental disorders due to known physiological condition: Secondary | ICD-10-CM | POA: Diagnosis not present

## 2017-01-17 DIAGNOSIS — F068 Other specified mental disorders due to known physiological condition: Secondary | ICD-10-CM | POA: Diagnosis not present

## 2017-01-20 DIAGNOSIS — F068 Other specified mental disorders due to known physiological condition: Secondary | ICD-10-CM | POA: Diagnosis not present

## 2017-01-21 DIAGNOSIS — F068 Other specified mental disorders due to known physiological condition: Secondary | ICD-10-CM | POA: Diagnosis not present

## 2017-01-22 DIAGNOSIS — F068 Other specified mental disorders due to known physiological condition: Secondary | ICD-10-CM | POA: Diagnosis not present

## 2017-01-23 DIAGNOSIS — F068 Other specified mental disorders due to known physiological condition: Secondary | ICD-10-CM | POA: Diagnosis not present

## 2017-01-24 DIAGNOSIS — F068 Other specified mental disorders due to known physiological condition: Secondary | ICD-10-CM | POA: Diagnosis not present

## 2017-01-25 DIAGNOSIS — F068 Other specified mental disorders due to known physiological condition: Secondary | ICD-10-CM | POA: Diagnosis not present

## 2017-01-26 ENCOUNTER — Emergency Department (HOSPITAL_COMMUNITY)
Admission: EM | Admit: 2017-01-26 | Discharge: 2017-01-26 | Disposition: A | Discharge status: Home / Self Care | Payer: Medicare HMO | Attending: Emergency Medicine | Admitting: Emergency Medicine

## 2017-01-26 ENCOUNTER — Encounter (HOSPITAL_COMMUNITY): Payer: Self-pay | Admitting: *Deleted

## 2017-01-26 ENCOUNTER — Other Ambulatory Visit: Payer: Self-pay | Admitting: Licensed Clinical Social Worker

## 2017-01-26 DIAGNOSIS — F17210 Nicotine dependence, cigarettes, uncomplicated: Secondary | ICD-10-CM | POA: Insufficient documentation

## 2017-01-26 DIAGNOSIS — J069 Acute upper respiratory infection, unspecified: Secondary | ICD-10-CM | POA: Insufficient documentation

## 2017-01-26 DIAGNOSIS — R05 Cough: Secondary | ICD-10-CM | POA: Diagnosis not present

## 2017-01-26 DIAGNOSIS — J439 Emphysema, unspecified: Secondary | ICD-10-CM | POA: Insufficient documentation

## 2017-01-26 DIAGNOSIS — E119 Type 2 diabetes mellitus without complications: Secondary | ICD-10-CM | POA: Diagnosis not present

## 2017-01-26 DIAGNOSIS — I5033 Acute on chronic diastolic (congestive) heart failure: Secondary | ICD-10-CM | POA: Diagnosis not present

## 2017-01-26 DIAGNOSIS — F068 Other specified mental disorders due to known physiological condition: Secondary | ICD-10-CM | POA: Diagnosis not present

## 2017-01-26 DIAGNOSIS — I2510 Atherosclerotic heart disease of native coronary artery without angina pectoris: Secondary | ICD-10-CM | POA: Insufficient documentation

## 2017-01-26 DIAGNOSIS — B9789 Other viral agents as the cause of diseases classified elsewhere: Secondary | ICD-10-CM | POA: Diagnosis not present

## 2017-01-26 MED ORDER — DEXTROMETHORPHAN POLISTIREX ER 30 MG/5ML PO SUER
15.0000 mg | Freq: Once | ORAL | Status: AC
  Administered 2017-01-26: 15 mg via ORAL
  Filled 2017-01-26: qty 5

## 2017-01-26 MED ORDER — PROMETHAZINE-DM 6.25-15 MG/5ML PO SYRP: 5.0000 mL | ORAL_SOLUTION | Freq: Four times a day (QID) | ORAL | 0 refills | Status: DC | PRN

## 2017-01-26 NOTE — ED Provider Notes (Signed)
[facility] DEPT Provider Note   CSN: [id number] Arrival date & time: 01/26/17  0944     History   Chief Complaint Chief Complaint  Patient presents with  . Cough    HPI Kim [name] is a 61 y.o. female with a history of COPD and congestive heart failure who presents to the emergency department with productive cough with clear sputum, sore throat, and nasal congestion that began 2 days ago. She reports that she came to the ED for a prescription of cough medicine, and states that she is in a hurry because she needs to be alert work in less than an hour. She denies fever, chills, otalgia, sinus pain or pressure, dyspnea, or chest pain. No treatment prior to arrival. No sick contacts.  Past medical history includes congestive heart failure on Lasix and schizophrenia on Risperdal.  The history is provided by the patient. No language interpreter was used.    Past Medical History:  Diagnosis Date  . CHF (congestive heart failure) ([place])   . COPD (chronic obstructive pulmonary disease) ([place])   . Diabetes mellitus without complication ([place])   . GERD (gastroesophageal reflux disease)   . History of DVT (deep vein thrombosis)   . Hypercholesteremia   . Hyperlipemia   . Normal coronary arteries    by cardiac catheterization which I performed September 2015  . Schizophrenia ([place])   . Tobacco abuse     Patient Active Problem List   Diagnosis Date Noted  . COPD with emphysema ([place]) 04/07/2014  . Acute on chronic diastolic heart failure ([place]) 03/18/2014  . DVT, popliteal, acute ([place]) 03/18/2014  . Tobacco abuse 03/18/2014  . Coronary artery disease, non-occlusive 02/10/2014  . Angina, class III - related to Acute Diastolic HF & COPD Exacerbation; Non-obstructive CAD 02/09/2014  . Hyperlipidemia 08/28/2008    Past Surgical History:  Procedure Laterality Date  . LEFT HEART CATHETERIZATION WITH CORONARY ANGIOGRAM N/A 02/09/2014   Procedure: LEFT HEART CATHETERIZATION WITH CORONARY  ANGIOGRAM;  Surgeon: Lorretta Harp, MD;  Location: [facility] CATH LAB;  Service: Cardiovascular;  Laterality: N/A;  . none      OB History    No data available       Home Medications    Prior to Admission medications   Medication Sig Start Date End Date Taking? Authorizing Provider  albuterol (PROVENTIL HFA;VENTOLIN HFA) 108 (90 BASE) MCG/ACT inhaler Inhale 2 puffs into the lungs every 6 (six) hours as needed for shortness of breath (Use as first line for wheezing / shortness of breath.). 02/10/14   Donita Brooks, NP  cholecalciferol (VITAMIN D) 1000 units tablet Take 1,000 Units by mouth daily.    [provider]  diclofenac sodium (VOLTAREN) 1 % GEL Apply 2 g topically 4 (four) times daily. 06/24/16   Blanchie Dessert, MD  doxycycline (VIBRAMYCIN) 100 MG capsule Take 1 capsule (100 mg total) by mouth 2 (two) times daily. 12/29/16   Barnet Glasgow, NP  furosemide (LASIX) 20 MG tablet TAKE 1 OR 2 TABLETS BY MOUTH AS NEEDED Patient taking differently: TAKE 1 TABLET BY MOUTH ONCE DAILY 05/13/15   Rigoberto Noel, MD  loratadine (CLARITIN) 10 MG tablet Take 10 mg by mouth daily.    [provider]  potassium chloride (K-DUR) 10 MEQ tablet Take 1 tablet (10 mEq total) by mouth daily. May take an extra potassium as needed. 02/20/14   Brett Canales, PA-C  promethazine-dextromethorphan (PROMETHAZINE-DM) 6.25-15 MG/5ML syrup Take 5 mLs by mouth 4 (four)  times daily as needed for cough. 01/26/17   Michale Emmerich A, PA-C  risperiDONE (RISPERDAL) 2 MG tablet Take 2 mg by mouth at bedtime.    [provider]    Family History History reviewed. No pertinent family history.  Social History Social History  Substance Use Topics  . Smoking status: Current Every Day Smoker    Packs/day: 0.50    Years: 21.00    Types: Cigarettes  . Smokeless tobacco: Never Used     Comment: smoking 1/2-1ppd   . Alcohol use No     Allergies   Asa [aspirin]; Metformin and related;  Penicillins; Quetiapine; and Acetaminophen   Review of Systems Review of Systems  Constitutional: Negative for activity change, chills and fever.  HENT: Positive for sore throat. Negative for ear pain, sinus pain and sinus pressure.   Respiratory: Positive for cough. Negative for shortness of breath.   Cardiovascular: Negative for chest pain.  Gastrointestinal: Negative for abdominal pain.  Musculoskeletal: Negative for back pain.  Skin: Negative for rash.   Physical Exam Updated Vital Signs BP 118/82 (BP Location: Left Arm)   Pulse 83   Temp 98.3 F (36.8 C) (Oral)   Resp 18   SpO2 92%   Physical Exam  Constitutional: No distress.  HENT:  Head: Normocephalic.  Left Ear: Tympanic membrane normal.  Nose: Rhinorrhea present. No mucosal edema. Right sinus exhibits no maxillary sinus tenderness and no frontal sinus tenderness. Left sinus exhibits no maxillary sinus tenderness and no frontal sinus tenderness.  Mouth/Throat: Uvula is midline and mucous membranes are normal. Posterior oropharyngeal erythema present. No oropharyngeal exudate, posterior oropharyngeal edema or tonsillar abscesses. No tonsillar exudate.  Cerumen impaction right ear.  Eyes: Conjunctivae are normal.  Neck: Neck supple.  Cardiovascular: Normal rate, regular rhythm and normal heart sounds.  Exam reveals no gallop and no friction rub.   No murmur heard. Pulmonary/Chest: Effort normal and breath sounds normal. No respiratory distress. She has no wheezes. She has no rales.  Abdominal: Soft. She exhibits no distension.  Neurological: She is alert.  Skin: Skin is warm. No rash noted.  Psychiatric: Her behavior is normal.  Nursing note and vitals reviewed.  ED Treatments / Results  Labs (all labs ordered are listed, but only abnormal results are displayed) Labs Reviewed - No data to display  EKG  EKG Interpretation None       Radiology No results found.  Procedures Procedures (including critical  care time)  Medications Ordered in ED Medications  dextromethorphan (DELSYM) 30 MG/5ML liquid 15 mg (15 mg Oral Given 01/26/17 1125)     Initial Impression / Assessment and Plan / ED Course  I have reviewed the triage vital signs and the nursing notes.  Pertinent labs & imaging results that were available during my care of the patient were reviewed by me and considered in my medical decision making (see chart for details).     61 year old female with a history of congestive heart failure and COPD who presents to the emergency department with nasal congestion and productive cough. Patient is requesting a prescription for cough medicine and to be discharged. SaO2 at 92%. I reviewed the patient's medical records shows that her previous SaO2 has been 94-98% on previous visits. Lungs are clear to auscultation bilaterally. She denies dyspnea at this time. She declines a chest x-ray and albuterol nebulizer treatment in the ED. Patient is currently taking Risperdal for his schizophrenia, will provide a prescription for promethazine dextromethorphan, which is a  class C for interaction risk. Discussed this medication with the patient about the potential for side effects, and discourage the patient from taking other over-the-counter products, particularly with one of the second, which has a class D interaction risk with Risperdal. Strict return precautions given, including if the patient develops chest pain, fever, or dyspnea. The patient reports she has albuterol inhaler at home. Afebrile. No tachycardia. Normotensive. Will provide the patient with a work note for today. The patient is safe and stable for discharge at this time.  Final Clinical Impressions(s) / ED Diagnoses   Final diagnoses:  Viral URI with cough    New Prescriptions Discharge Medication List as of 01/26/2017 11:20 AM    START taking these medications   Details  promethazine-dextromethorphan (PROMETHAZINE-DM) 6.25-15 MG/5ML syrup Take  5 mLs by mouth 4 (four) times daily as needed for cough., Starting Fri 01/26/2017, Print         Tomeshia Pizzi A, PA-C 01/26/17 1205    Milton Ferguson, MD 01/29/17 1615

## 2017-01-26 NOTE — Patient Outreach (Signed)
Assessment:  CSW was able to make initial contact with patient today to perform CSW screening with [facility] Medicare patients with acuity level 4 or 5.  CSW introduced self, explained role and types of services provided through [facility]).  CSW further explained to patient that CSW wants to ensure that patient has all their needs met, prior to returning home.  CSW obtained two HIPAA compliant identifiers from patient, which included patient's name and date of birth.  The following CSW screening was performed: The reason for patient's visit to the emergency department was due to productive cough for two days, fever, and pain.  Client previously had services in the home with [facility]. Client currently does not have any home health services in the home for client.  She sees Dr. Darleen Crocker as primary care doctor. Her last appointment with Dr. Laurance Flatten was 3 weeks ago. She said she had been trying to get follow up appointment with Dr. Laurance Flatten but had not been able to speak via phone with office representative. She said that she was not able to get representative on the phone from that office to schedule next client appointment . She said there were no barriers to keep her from seeing her provider.  She said she was able to get her prescribed medications filled and picked up. She said there was no barrier to keep her from getting her medications filled or picked up.  She said she did go to an urgent care center prior to coming today to emergency room. She said urgent care center did not open until 10:00 AM. She had to be at work later that morning so she then decided to go to emergency room for care.  She said she did not have difficulty caring for herself at home. She said there is no other person at home to help care for her. She said that access to urgent care center or access to her primary care doctor could help prevent her from readmission to the hospital or  coming again to the emergency room for care. CSW gave client [facility] CSW card. CSW gave client [facility] Welcome packet and spoke with client about [facility] program services. Client was not interested in [facility] care at present but said she would look over [facility] materials given. CSW thanked client for visit with CSW on 01/26/17. Patient made aware that [facility] services does not interfere or replace home health or other community based services.   Norva Riffle.Carolee Channell MSW, LCSW Licensed Clinical Social Worker [facility] [phone]

## 2017-01-26 NOTE — Discharge Instructions (Signed)
You may take 5 mL of cough syrup every 6 hours as needed for cough and cold symptoms. Please use your albuterol inhaler 2 puffs every 4 hours as needed for shortness of breath. If Medicare does not cover the prescription I have provided today, you can use any products over-the-counter with dextromethorphan to treat your symptoms. This medication is available over-the-counter. If you have any questions, you can speak with the pharmacist when you go to pick up this medication. Please let them know they take Risperdal because many cough and cold medicines interact with this medication.  If you develop new or worsening symptoms, including fever, chills, chest pain, worsening cough, shortness of breath despite taking her albuterol inhaler, please return to the emergency department for reevaluation.

## 2017-01-26 NOTE — ED Triage Notes (Signed)
Pt reports having productive cough x 2 days with white sputum. Reports having cold symptoms. No resp distress is noted at triage. Pt requesting cough and cold medication and needs to be at work by 11am.

## 2017-01-27 DIAGNOSIS — F068 Other specified mental disorders due to known physiological condition: Secondary | ICD-10-CM | POA: Diagnosis not present

## 2017-01-28 DIAGNOSIS — F068 Other specified mental disorders due to known physiological condition: Secondary | ICD-10-CM | POA: Diagnosis not present

## 2017-01-29 DIAGNOSIS — F068 Other specified mental disorders due to known physiological condition: Secondary | ICD-10-CM | POA: Diagnosis not present

## 2017-01-30 DIAGNOSIS — F068 Other specified mental disorders due to known physiological condition: Secondary | ICD-10-CM | POA: Diagnosis not present

## 2017-01-31 DIAGNOSIS — F068 Other specified mental disorders due to known physiological condition: Secondary | ICD-10-CM | POA: Diagnosis not present

## 2017-02-01 DIAGNOSIS — F068 Other specified mental disorders due to known physiological condition: Secondary | ICD-10-CM | POA: Diagnosis not present

## 2017-02-02 DIAGNOSIS — F068 Other specified mental disorders due to known physiological condition: Secondary | ICD-10-CM | POA: Diagnosis not present

## 2017-02-03 DIAGNOSIS — F068 Other specified mental disorders due to known physiological condition: Secondary | ICD-10-CM | POA: Diagnosis not present

## 2017-02-04 DIAGNOSIS — F068 Other specified mental disorders due to known physiological condition: Secondary | ICD-10-CM | POA: Diagnosis not present

## 2017-02-05 DIAGNOSIS — F068 Other specified mental disorders due to known physiological condition: Secondary | ICD-10-CM | POA: Diagnosis not present

## 2017-02-06 DIAGNOSIS — F068 Other specified mental disorders due to known physiological condition: Secondary | ICD-10-CM | POA: Diagnosis not present

## 2017-02-07 ENCOUNTER — Encounter (HOSPITAL_COMMUNITY): Payer: Self-pay | Admitting: Emergency Medicine

## 2017-02-07 ENCOUNTER — Ambulatory Visit (HOSPITAL_COMMUNITY)
Admission: EM | Admit: 2017-02-07 | Discharge: 2017-02-07 | Disposition: A | Discharge status: Home / Self Care | Payer: Medicare HMO | Attending: Family Medicine | Admitting: Family Medicine

## 2017-02-07 DIAGNOSIS — F068 Other specified mental disorders due to known physiological condition: Secondary | ICD-10-CM | POA: Diagnosis not present

## 2017-02-07 DIAGNOSIS — H6123 Impacted cerumen, bilateral: Secondary | ICD-10-CM

## 2017-02-07 NOTE — ED Provider Notes (Signed)
[facility]    CSN: [id number] Arrival date & time: 02/07/17  1745     History   Chief Complaint Chief Complaint  Patient presents with  . Ear Fullness    HPI Kim [name] is a 61 y.o. female.   61 year old female well-known to the urgent care presents because her ears are stopped up with wax. She has some decrease in hearing. No pain. This is been going on for several weeks. She states she has tried irrigating her own here without success.      Past Medical History:  Diagnosis Date  . CHF (congestive heart failure) ([place])   . COPD (chronic obstructive pulmonary disease) ([place])   . Diabetes mellitus without complication ([place])   . GERD (gastroesophageal reflux disease)   . History of DVT (deep vein thrombosis)   . Hypercholesteremia   . Hyperlipemia   . Normal coronary arteries    by cardiac catheterization which I performed September 2015  . Schizophrenia ([place])   . Tobacco abuse     Patient Active Problem List   Diagnosis Date Noted  . COPD with emphysema ([place]) 04/07/2014  . Acute on chronic diastolic heart failure ([place]) 03/18/2014  . DVT, popliteal, acute ([place]) 03/18/2014  . Tobacco abuse 03/18/2014  . Coronary artery disease, non-occlusive 02/10/2014  . Angina, class III - related to Acute Diastolic HF & COPD Exacerbation; Non-obstructive CAD 02/09/2014  . Hyperlipidemia 08/28/2008    Past Surgical History:  Procedure Laterality Date  . LEFT HEART CATHETERIZATION WITH CORONARY ANGIOGRAM N/A 02/09/2014   Procedure: LEFT HEART CATHETERIZATION WITH CORONARY ANGIOGRAM;  Surgeon: Lorretta Harp, MD;  Location: [facility] CATH LAB;  Service: Cardiovascular;  Laterality: N/A;  . none      OB History    No data available       Home Medications    Prior to Admission medications   Medication Sig Start Date End Date Taking? Authorizing Provider  aspirin 81 MG chewable tablet Chew by mouth daily.   Yes [provider]  cholecalciferol (VITAMIN  D) 1000 units tablet Take 1,000 Units by mouth daily.   Yes [provider]  furosemide (LASIX) 20 MG tablet TAKE 1 OR 2 TABLETS BY MOUTH AS NEEDED Patient taking differently: TAKE 1 TABLET BY MOUTH ONCE DAILY 05/13/15  Yes Rigoberto Noel, MD  potassium chloride (K-DUR) 10 MEQ tablet Take 1 tablet (10 mEq total) by mouth daily. May take an extra potassium as needed. 02/20/14  Yes Brett Canales, PA-C  risperiDONE (RISPERDAL) 2 MG tablet Take 2 mg by mouth at bedtime.   Yes [provider]    Family History History reviewed. No pertinent family history.  Social History Social History  Substance Use Topics  . Smoking status: Current Every Day Smoker    Packs/day: 0.50    Years: 21.00    Types: Cigarettes  . Smokeless tobacco: Never Used     Comment: smoking 1/2-1ppd   . Alcohol use No     Allergies   Asa [aspirin]; Metformin and related; Penicillins; Quetiapine; and Acetaminophen   Review of Systems Review of Systems  Constitutional: Negative.   HENT: Positive for hearing loss. Negative for ear discharge, ear pain, postnasal drip and rhinorrhea.   Respiratory: Negative.   Cardiovascular: Negative.   All other systems reviewed and are negative.    Physical Exam Triage Vital Signs ED Triage Vitals  Enc Vitals Group     BP 02/07/17 1830 97/68  Pulse Rate 02/07/17 1830 77     Resp 02/07/17 1830 20     Temp 02/07/17 1830 98.7 F (37.1 C)     Temp Source 02/07/17 1830 Oral     SpO2 02/07/17 1830 96 %     Weight --      Height --      Head Circumference --      Peak Flow --      Pain Score 02/07/17 1829 0     Pain Loc --      Pain Edu? --      Excl. in [place]? --    No data found.   Updated Vital Signs BP 97/68 (BP Location: Left Arm)   Pulse 77   Temp 98.7 F (37.1 C) (Oral)   Resp 20   SpO2 96%   Visual Acuity Right Eye Distance:   Left Eye Distance:   Bilateral Distance:    Right Eye Near:   Left Eye Near:    Bilateral Near:      Physical Exam  Constitutional: She is oriented to person, place, and time. She appears well-developed and well-nourished. No distress.  HENT:  Head: Normocephalic and atraumatic.  Bilateral EACs are filled with cerumen.  Eyes: EOM are normal.  Neck: Normal range of motion. Neck supple.  Cardiovascular: Normal rate.   Pulmonary/Chest: Effort normal. No respiratory distress.  Musculoskeletal: She exhibits no edema.  Neurological: She is alert and oriented to person, place, and time. She exhibits normal muscle tone.  Skin: Skin is warm and dry.  Psychiatric: She has a normal mood and affect.  Nursing note and vitals reviewed.    UC Treatments / Results  Labs (all labs ordered are listed, but only abnormal results are displayed) Labs Reviewed - No data to display  EKG  EKG Interpretation None       Radiology No results found.  Procedures Procedures (including critical care time)  Medications Ordered in UC Medications - No data to display   Initial Impression / Assessment and Plan / UC Course  I have reviewed the triage vital signs and the nursing notes.  Pertinent labs & imaging results that were available during my care of the patient were reviewed by me and considered in my medical decision making (see chart for details).    Read instructions to how to clean your ears out. May also follow-up with your primary care doctor if needed for cleaning out the ears.  Post irrigation the EACs are perfectly clear. TMs are mildly erythematous and spots. This is due to irritation of the irrigation. No signs of infection.   Final Clinical Impressions(s) / UC Diagnoses   Final diagnoses:  Bilateral hearing loss due to cerumen impaction    New Prescriptions New Prescriptions   No medications on file   Instructions per provider 1955 hrs.  Controlled Substance Prescriptions [place] Controlled Substance Registry consulted? Not Applicable   Janne Napoleon, NP 02/07/17 [phone]

## 2017-02-07 NOTE — Discharge Instructions (Signed)
Read instructions to how to clean your ears out. May also follow-up with your primary care doctor if needed for cleaning out the ears.

## 2017-02-07 NOTE — ED Triage Notes (Signed)
The patient presented to the [facility] with wax build up and decreased hearing in both ears x 1 week.

## 2017-02-08 DIAGNOSIS — F068 Other specified mental disorders due to known physiological condition: Secondary | ICD-10-CM | POA: Diagnosis not present

## 2017-02-09 DIAGNOSIS — F068 Other specified mental disorders due to known physiological condition: Secondary | ICD-10-CM | POA: Diagnosis not present

## 2017-02-10 DIAGNOSIS — F068 Other specified mental disorders due to known physiological condition: Secondary | ICD-10-CM | POA: Diagnosis not present

## 2017-02-11 DIAGNOSIS — F068 Other specified mental disorders due to known physiological condition: Secondary | ICD-10-CM | POA: Diagnosis not present

## 2017-02-12 DIAGNOSIS — F068 Other specified mental disorders due to known physiological condition: Secondary | ICD-10-CM | POA: Diagnosis not present

## 2017-02-13 DIAGNOSIS — F068 Other specified mental disorders due to known physiological condition: Secondary | ICD-10-CM | POA: Diagnosis not present

## 2017-02-14 DIAGNOSIS — F068 Other specified mental disorders due to known physiological condition: Secondary | ICD-10-CM | POA: Diagnosis not present

## 2017-02-15 DIAGNOSIS — F068 Other specified mental disorders due to known physiological condition: Secondary | ICD-10-CM | POA: Diagnosis not present

## 2017-02-16 DIAGNOSIS — F068 Other specified mental disorders due to known physiological condition: Secondary | ICD-10-CM | POA: Diagnosis not present

## 2017-02-17 DIAGNOSIS — F068 Other specified mental disorders due to known physiological condition: Secondary | ICD-10-CM | POA: Diagnosis not present

## 2017-02-19 DIAGNOSIS — F068 Other specified mental disorders due to known physiological condition: Secondary | ICD-10-CM | POA: Diagnosis not present

## 2017-02-20 DIAGNOSIS — F068 Other specified mental disorders due to known physiological condition: Secondary | ICD-10-CM | POA: Diagnosis not present

## 2017-02-21 DIAGNOSIS — F068 Other specified mental disorders due to known physiological condition: Secondary | ICD-10-CM | POA: Diagnosis not present

## 2017-02-22 DIAGNOSIS — F068 Other specified mental disorders due to known physiological condition: Secondary | ICD-10-CM | POA: Diagnosis not present

## 2017-02-23 DIAGNOSIS — F068 Other specified mental disorders due to known physiological condition: Secondary | ICD-10-CM | POA: Diagnosis not present

## 2017-02-24 DIAGNOSIS — F068 Other specified mental disorders due to known physiological condition: Secondary | ICD-10-CM | POA: Diagnosis not present

## 2017-02-25 DIAGNOSIS — F068 Other specified mental disorders due to known physiological condition: Secondary | ICD-10-CM | POA: Diagnosis not present

## 2017-02-26 DIAGNOSIS — F068 Other specified mental disorders due to known physiological condition: Secondary | ICD-10-CM | POA: Diagnosis not present

## 2017-02-27 DIAGNOSIS — F068 Other specified mental disorders due to known physiological condition: Secondary | ICD-10-CM | POA: Diagnosis not present

## 2017-02-28 DIAGNOSIS — F068 Other specified mental disorders due to known physiological condition: Secondary | ICD-10-CM | POA: Diagnosis not present

## 2017-03-01 DIAGNOSIS — F068 Other specified mental disorders due to known physiological condition: Secondary | ICD-10-CM | POA: Diagnosis not present

## 2017-03-02 DIAGNOSIS — F068 Other specified mental disorders due to known physiological condition: Secondary | ICD-10-CM | POA: Diagnosis not present

## 2017-03-03 DIAGNOSIS — F068 Other specified mental disorders due to known physiological condition: Secondary | ICD-10-CM | POA: Diagnosis not present

## 2017-03-04 DIAGNOSIS — F068 Other specified mental disorders due to known physiological condition: Secondary | ICD-10-CM | POA: Diagnosis not present

## 2017-03-05 DIAGNOSIS — F068 Other specified mental disorders due to known physiological condition: Secondary | ICD-10-CM | POA: Diagnosis not present

## 2017-03-06 DIAGNOSIS — F068 Other specified mental disorders due to known physiological condition: Secondary | ICD-10-CM | POA: Diagnosis not present

## 2017-03-07 DIAGNOSIS — F068 Other specified mental disorders due to known physiological condition: Secondary | ICD-10-CM | POA: Diagnosis not present

## 2017-03-08 DIAGNOSIS — F068 Other specified mental disorders due to known physiological condition: Secondary | ICD-10-CM | POA: Diagnosis not present

## 2017-03-09 DIAGNOSIS — F068 Other specified mental disorders due to known physiological condition: Secondary | ICD-10-CM | POA: Diagnosis not present

## 2017-03-10 DIAGNOSIS — F068 Other specified mental disorders due to known physiological condition: Secondary | ICD-10-CM | POA: Diagnosis not present

## 2017-03-11 DIAGNOSIS — F068 Other specified mental disorders due to known physiological condition: Secondary | ICD-10-CM | POA: Diagnosis not present

## 2017-03-12 DIAGNOSIS — F068 Other specified mental disorders due to known physiological condition: Secondary | ICD-10-CM | POA: Diagnosis not present

## 2017-03-13 DIAGNOSIS — F068 Other specified mental disorders due to known physiological condition: Secondary | ICD-10-CM | POA: Diagnosis not present

## 2017-03-14 DIAGNOSIS — F068 Other specified mental disorders due to known physiological condition: Secondary | ICD-10-CM | POA: Diagnosis not present

## 2017-03-15 DIAGNOSIS — F068 Other specified mental disorders due to known physiological condition: Secondary | ICD-10-CM | POA: Diagnosis not present

## 2017-03-16 DIAGNOSIS — F068 Other specified mental disorders due to known physiological condition: Secondary | ICD-10-CM | POA: Diagnosis not present

## 2017-03-17 DIAGNOSIS — F068 Other specified mental disorders due to known physiological condition: Secondary | ICD-10-CM | POA: Diagnosis not present

## 2017-03-18 DIAGNOSIS — F068 Other specified mental disorders due to known physiological condition: Secondary | ICD-10-CM | POA: Diagnosis not present

## 2017-03-19 DIAGNOSIS — F068 Other specified mental disorders due to known physiological condition: Secondary | ICD-10-CM | POA: Diagnosis not present

## 2017-03-20 DIAGNOSIS — F068 Other specified mental disorders due to known physiological condition: Secondary | ICD-10-CM | POA: Diagnosis not present

## 2017-03-21 DIAGNOSIS — F209 Schizophrenia, unspecified: Secondary | ICD-10-CM | POA: Diagnosis not present

## 2017-03-21 DIAGNOSIS — F068 Other specified mental disorders due to known physiological condition: Secondary | ICD-10-CM | POA: Diagnosis not present

## 2017-03-22 DIAGNOSIS — F068 Other specified mental disorders due to known physiological condition: Secondary | ICD-10-CM | POA: Diagnosis not present

## 2017-03-23 DIAGNOSIS — F068 Other specified mental disorders due to known physiological condition: Secondary | ICD-10-CM | POA: Diagnosis not present

## 2017-03-24 DIAGNOSIS — F068 Other specified mental disorders due to known physiological condition: Secondary | ICD-10-CM | POA: Diagnosis not present

## 2017-03-25 DIAGNOSIS — F068 Other specified mental disorders due to known physiological condition: Secondary | ICD-10-CM | POA: Diagnosis not present

## 2017-03-26 DIAGNOSIS — F068 Other specified mental disorders due to known physiological condition: Secondary | ICD-10-CM | POA: Diagnosis not present

## 2017-03-27 DIAGNOSIS — F068 Other specified mental disorders due to known physiological condition: Secondary | ICD-10-CM | POA: Diagnosis not present

## 2017-03-28 DIAGNOSIS — F068 Other specified mental disorders due to known physiological condition: Secondary | ICD-10-CM | POA: Diagnosis not present

## 2017-03-29 DIAGNOSIS — F068 Other specified mental disorders due to known physiological condition: Secondary | ICD-10-CM | POA: Diagnosis not present

## 2017-03-30 DIAGNOSIS — F068 Other specified mental disorders due to known physiological condition: Secondary | ICD-10-CM | POA: Diagnosis not present

## 2017-03-31 DIAGNOSIS — F068 Other specified mental disorders due to known physiological condition: Secondary | ICD-10-CM | POA: Diagnosis not present

## 2017-04-01 DIAGNOSIS — F068 Other specified mental disorders due to known physiological condition: Secondary | ICD-10-CM | POA: Diagnosis not present

## 2017-04-02 DIAGNOSIS — F068 Other specified mental disorders due to known physiological condition: Secondary | ICD-10-CM | POA: Diagnosis not present

## 2017-04-03 DIAGNOSIS — F068 Other specified mental disorders due to known physiological condition: Secondary | ICD-10-CM | POA: Diagnosis not present

## 2017-04-04 DIAGNOSIS — F068 Other specified mental disorders due to known physiological condition: Secondary | ICD-10-CM | POA: Diagnosis not present

## 2017-04-05 DIAGNOSIS — F068 Other specified mental disorders due to known physiological condition: Secondary | ICD-10-CM | POA: Diagnosis not present

## 2017-04-06 DIAGNOSIS — F068 Other specified mental disorders due to known physiological condition: Secondary | ICD-10-CM | POA: Diagnosis not present

## 2017-04-07 DIAGNOSIS — F068 Other specified mental disorders due to known physiological condition: Secondary | ICD-10-CM | POA: Diagnosis not present

## 2017-04-08 DIAGNOSIS — F068 Other specified mental disorders due to known physiological condition: Secondary | ICD-10-CM | POA: Diagnosis not present

## 2017-04-09 DIAGNOSIS — F068 Other specified mental disorders due to known physiological condition: Secondary | ICD-10-CM | POA: Diagnosis not present

## 2017-04-10 DIAGNOSIS — F068 Other specified mental disorders due to known physiological condition: Secondary | ICD-10-CM | POA: Diagnosis not present

## 2017-04-11 DIAGNOSIS — F068 Other specified mental disorders due to known physiological condition: Secondary | ICD-10-CM | POA: Diagnosis not present

## 2017-04-12 DIAGNOSIS — F068 Other specified mental disorders due to known physiological condition: Secondary | ICD-10-CM | POA: Diagnosis not present

## 2017-04-13 DIAGNOSIS — F068 Other specified mental disorders due to known physiological condition: Secondary | ICD-10-CM | POA: Diagnosis not present

## 2017-04-14 DIAGNOSIS — F068 Other specified mental disorders due to known physiological condition: Secondary | ICD-10-CM | POA: Diagnosis not present

## 2017-04-15 DIAGNOSIS — F068 Other specified mental disorders due to known physiological condition: Secondary | ICD-10-CM | POA: Diagnosis not present

## 2017-04-16 DIAGNOSIS — F068 Other specified mental disorders due to known physiological condition: Secondary | ICD-10-CM | POA: Diagnosis not present

## 2017-04-17 ENCOUNTER — Telehealth: Payer: Self-pay | Admitting: Pulmonary Disease

## 2017-04-17 DIAGNOSIS — F209 Schizophrenia, unspecified: Secondary | ICD-10-CM | POA: Diagnosis not present

## 2017-04-17 DIAGNOSIS — F068 Other specified mental disorders due to known physiological condition: Secondary | ICD-10-CM | POA: Diagnosis not present

## 2017-04-17 NOTE — Telephone Encounter (Signed)
Received form, placed in RA look at folder.

## 2017-04-18 DIAGNOSIS — F068 Other specified mental disorders due to known physiological condition: Secondary | ICD-10-CM | POA: Diagnosis not present

## 2017-04-18 NOTE — Telephone Encounter (Signed)
Form is complete and has been placed up front in brown folder for pickup. Lm to make pt aware.

## 2017-04-18 NOTE — Telephone Encounter (Signed)
done

## 2017-04-18 NOTE — Telephone Encounter (Signed)
Cherina please advise once letter is complete. Thanks.

## 2017-04-19 DIAGNOSIS — F068 Other specified mental disorders due to known physiological condition: Secondary | ICD-10-CM | POA: Diagnosis not present

## 2017-04-21 DIAGNOSIS — F068 Other specified mental disorders due to known physiological condition: Secondary | ICD-10-CM | POA: Diagnosis not present

## 2017-04-22 DIAGNOSIS — F068 Other specified mental disorders due to known physiological condition: Secondary | ICD-10-CM | POA: Diagnosis not present

## 2017-04-23 DIAGNOSIS — F068 Other specified mental disorders due to known physiological condition: Secondary | ICD-10-CM | POA: Diagnosis not present

## 2017-04-24 DIAGNOSIS — F068 Other specified mental disorders due to known physiological condition: Secondary | ICD-10-CM | POA: Diagnosis not present

## 2017-04-25 DIAGNOSIS — F068 Other specified mental disorders due to known physiological condition: Secondary | ICD-10-CM | POA: Diagnosis not present

## 2017-04-26 DIAGNOSIS — F068 Other specified mental disorders due to known physiological condition: Secondary | ICD-10-CM | POA: Diagnosis not present

## 2017-04-27 DIAGNOSIS — F068 Other specified mental disorders due to known physiological condition: Secondary | ICD-10-CM | POA: Diagnosis not present

## 2017-04-28 DIAGNOSIS — F068 Other specified mental disorders due to known physiological condition: Secondary | ICD-10-CM | POA: Diagnosis not present

## 2017-04-29 DIAGNOSIS — F068 Other specified mental disorders due to known physiological condition: Secondary | ICD-10-CM | POA: Diagnosis not present

## 2017-04-30 DIAGNOSIS — F068 Other specified mental disorders due to known physiological condition: Secondary | ICD-10-CM | POA: Diagnosis not present

## 2017-05-01 DIAGNOSIS — F068 Other specified mental disorders due to known physiological condition: Secondary | ICD-10-CM | POA: Diagnosis not present

## 2017-05-01 DIAGNOSIS — Z720 Tobacco use: Secondary | ICD-10-CM | POA: Diagnosis not present

## 2017-05-01 DIAGNOSIS — J449 Chronic obstructive pulmonary disease, unspecified: Secondary | ICD-10-CM | POA: Diagnosis not present

## 2017-05-01 DIAGNOSIS — Z1239 Encounter for other screening for malignant neoplasm of breast: Secondary | ICD-10-CM | POA: Diagnosis not present

## 2017-05-01 DIAGNOSIS — I10 Essential (primary) hypertension: Secondary | ICD-10-CM | POA: Diagnosis not present

## 2017-05-01 DIAGNOSIS — Z1212 Encounter for screening for malignant neoplasm of rectum: Secondary | ICD-10-CM | POA: Diagnosis not present

## 2017-05-01 DIAGNOSIS — E782 Mixed hyperlipidemia: Secondary | ICD-10-CM | POA: Diagnosis not present

## 2017-05-01 DIAGNOSIS — R221 Localized swelling, mass and lump, neck: Secondary | ICD-10-CM | POA: Diagnosis not present

## 2017-05-01 DIAGNOSIS — E1165 Type 2 diabetes mellitus with hyperglycemia: Secondary | ICD-10-CM | POA: Diagnosis not present

## 2017-05-01 DIAGNOSIS — Z0000 Encounter for general adult medical examination without abnormal findings: Secondary | ICD-10-CM | POA: Diagnosis not present

## 2017-05-01 DIAGNOSIS — J310 Chronic rhinitis: Secondary | ICD-10-CM | POA: Diagnosis not present

## 2017-05-02 DIAGNOSIS — F068 Other specified mental disorders due to known physiological condition: Secondary | ICD-10-CM | POA: Diagnosis not present

## 2017-05-03 DIAGNOSIS — F068 Other specified mental disorders due to known physiological condition: Secondary | ICD-10-CM | POA: Diagnosis not present

## 2017-05-04 ENCOUNTER — Other Ambulatory Visit: Payer: Self-pay | Admitting: Nurse Practitioner

## 2017-05-04 DIAGNOSIS — R221 Localized swelling, mass and lump, neck: Secondary | ICD-10-CM

## 2017-05-04 DIAGNOSIS — F068 Other specified mental disorders due to known physiological condition: Secondary | ICD-10-CM | POA: Diagnosis not present

## 2017-05-05 DIAGNOSIS — F068 Other specified mental disorders due to known physiological condition: Secondary | ICD-10-CM | POA: Diagnosis not present

## 2017-05-06 DIAGNOSIS — F068 Other specified mental disorders due to known physiological condition: Secondary | ICD-10-CM | POA: Diagnosis not present

## 2017-05-07 DIAGNOSIS — F068 Other specified mental disorders due to known physiological condition: Secondary | ICD-10-CM | POA: Diagnosis not present

## 2017-05-08 DIAGNOSIS — F068 Other specified mental disorders due to known physiological condition: Secondary | ICD-10-CM | POA: Diagnosis not present

## 2017-05-09 DIAGNOSIS — F068 Other specified mental disorders due to known physiological condition: Secondary | ICD-10-CM | POA: Diagnosis not present

## 2017-05-10 DIAGNOSIS — F068 Other specified mental disorders due to known physiological condition: Secondary | ICD-10-CM | POA: Diagnosis not present

## 2017-05-11 DIAGNOSIS — F068 Other specified mental disorders due to known physiological condition: Secondary | ICD-10-CM | POA: Diagnosis not present

## 2017-05-12 DIAGNOSIS — F068 Other specified mental disorders due to known physiological condition: Secondary | ICD-10-CM | POA: Diagnosis not present

## 2017-05-13 DIAGNOSIS — F068 Other specified mental disorders due to known physiological condition: Secondary | ICD-10-CM | POA: Diagnosis not present

## 2017-05-14 DIAGNOSIS — F068 Other specified mental disorders due to known physiological condition: Secondary | ICD-10-CM | POA: Diagnosis not present

## 2017-05-15 DIAGNOSIS — F068 Other specified mental disorders due to known physiological condition: Secondary | ICD-10-CM | POA: Diagnosis not present

## 2017-05-16 DIAGNOSIS — F068 Other specified mental disorders due to known physiological condition: Secondary | ICD-10-CM | POA: Diagnosis not present

## 2017-05-17 DIAGNOSIS — F068 Other specified mental disorders due to known physiological condition: Secondary | ICD-10-CM | POA: Diagnosis not present

## 2017-05-18 DIAGNOSIS — F068 Other specified mental disorders due to known physiological condition: Secondary | ICD-10-CM | POA: Diagnosis not present

## 2017-05-25 DIAGNOSIS — J310 Chronic rhinitis: Secondary | ICD-10-CM | POA: Diagnosis not present

## 2017-05-25 DIAGNOSIS — D110 Benign neoplasm of parotid gland: Secondary | ICD-10-CM | POA: Diagnosis not present

## 2017-05-25 DIAGNOSIS — K119 Disease of salivary gland, unspecified: Secondary | ICD-10-CM | POA: Diagnosis not present

## 2017-05-30 ENCOUNTER — Other Ambulatory Visit: Payer: Self-pay | Admitting: Otolaryngology

## 2017-05-30 DIAGNOSIS — K118 Other diseases of salivary glands: Secondary | ICD-10-CM

## 2017-06-05 ENCOUNTER — Other Ambulatory Visit: Payer: Self-pay | Admitting: Otolaryngology

## 2017-06-05 ENCOUNTER — Ambulatory Visit
Admission: RE | Admit: 2017-06-05 | Discharge: 2017-06-05 | Disposition: A | Discharge status: Home / Self Care | Payer: Medicare HMO | Source: Ambulatory Visit | Attending: Otolaryngology | Admitting: Otolaryngology

## 2017-06-05 DIAGNOSIS — K118 Other diseases of salivary glands: Secondary | ICD-10-CM

## 2017-06-05 DIAGNOSIS — R221 Localized swelling, mass and lump, neck: Secondary | ICD-10-CM | POA: Diagnosis not present

## 2017-06-18 ENCOUNTER — Ambulatory Visit (INDEPENDENT_AMBULATORY_CARE_PROVIDER_SITE_OTHER): Payer: Medicare HMO | Admitting: Pulmonary Disease

## 2017-06-18 ENCOUNTER — Encounter: Payer: Self-pay | Admitting: Pulmonary Disease

## 2017-06-18 DIAGNOSIS — Z720 Tobacco use: Secondary | ICD-10-CM | POA: Diagnosis not present

## 2017-06-18 DIAGNOSIS — J432 Centrilobular emphysema: Secondary | ICD-10-CM

## 2017-06-18 NOTE — Patient Instructions (Signed)
Okay to stop taking Spiriva Stay on Advair twice daily  You have to quit smoking!

## 2017-06-18 NOTE — Progress Notes (Signed)
   Subjective:    Patient ID: Kim [name], female    DOB: August 23, 1955, 62 y.o.   MRN: [id number]  HPI  62 yo  Smoker , Group home resident with schizophrenia presents for FU of COPD  Hx of DVT prev on Xarelto x 6 mon . 2015 History of a left pleural effusion. 2010, most likely from a parapneumonic process.   She sees a psychiatrist at [facility] and feels like she wants to get off the Risperdal.  She also has stopped using Spiriva and feels that Advair is enough does not want to take too many medications. She also wants to stop taking her potassium.  Her breathing continues to be at baseline.  She has occasional wheezing.  She continues to smoke about a pack per day. She refuses flu shot today    Significant tests/ events reviewed   07/2008 - Left thoracentesis x 1050 09/18/2008> ex with 79% lymphs, 16% eos WBC 3610 .  -Duplex 03/2014 LLE DVT Spirometry 2015 FEV1 69%, FVC 78%, ratio 71  Spirometry 4/17/2018FEV1 47%, ratio 62, FVC 59%   Spirometry 11/2016  ratio 76 with FEV1 of 50% and FVC of 51% consistent with moderate restriction  Review of Systems  neg for any significant sore throat, dysphagia, itching, sneezing, nasal congestion or excess/ purulent secretions, fever, chills, sweats, unintended wt loss, pleuritic or exertional cp, hempoptysis, orthopnea pnd or change in chronic leg swelling. Also denies presyncope, palpitations, heartburn, abdominal pain, nausea, vomiting, diarrhea or change in bowel or urinary habits, dysuria,hematuria, rash, arthralgias, visual complaints, headache, numbness weakness or ataxia.     Objective:   Physical Exam  Gen. Pleasant, obese, in no distress ENT - no lesions, no post nasal drip Neck: No JVD, no thyromegaly, no carotid bruits Lungs: no use of accessory muscles, no dullness to percussion, decreased without rales or rhonchi  Cardiovascular: Rhythm regular, heart sounds  normal, no murmurs or gallops, no peripheral  edema Musculoskeletal: No deformities, no cyanosis or clubbing , no tremors       Assessment & Plan:

## 2017-06-18 NOTE — Assessment & Plan Note (Signed)
Okay to stop taking Spiriva Stay on Advair twice daily

## 2017-06-18 NOTE — Assessment & Plan Note (Signed)
Emphasized smoking cessation is the primary intervention for her

## 2017-06-22 DIAGNOSIS — F068 Other specified mental disorders due to known physiological condition: Secondary | ICD-10-CM | POA: Diagnosis not present

## 2017-06-23 DIAGNOSIS — F068 Other specified mental disorders due to known physiological condition: Secondary | ICD-10-CM | POA: Diagnosis not present

## 2017-06-24 DIAGNOSIS — F068 Other specified mental disorders due to known physiological condition: Secondary | ICD-10-CM | POA: Diagnosis not present

## 2017-06-25 ENCOUNTER — Encounter (HOSPITAL_COMMUNITY): Payer: Self-pay | Admitting: Family Medicine

## 2017-06-25 ENCOUNTER — Ambulatory Visit (HOSPITAL_COMMUNITY)
Admission: EM | Admit: 2017-06-25 | Discharge: 2017-06-25 | Disposition: A | Discharge status: Home / Self Care | Payer: Medicare HMO | Attending: Family Medicine | Admitting: Family Medicine

## 2017-06-25 DIAGNOSIS — J441 Chronic obstructive pulmonary disease with (acute) exacerbation: Secondary | ICD-10-CM | POA: Diagnosis not present

## 2017-06-25 DIAGNOSIS — F068 Other specified mental disorders due to known physiological condition: Secondary | ICD-10-CM | POA: Diagnosis not present

## 2017-06-25 MED ORDER — PREDNISONE 20 MG PO TABS: ORAL_TABLET | ORAL | 0 refills | Status: DC

## 2017-06-25 MED ORDER — FUROSEMIDE 20 MG PO TABS: ORAL_TABLET | ORAL | 0 refills | Status: DC

## 2017-06-25 NOTE — ED Provider Notes (Signed)
[place]   [id number] 06/25/17 Arrival Time: 1013   SUBJECTIVE:  Kim [name] is a 62 y.o. female who presents to the urgent care with complaint of cold symptoms with some shortness of breath.  Patient has h/o CHF and COPD, as well as DVT and type II Diabetes.  Patient smokes.  The symptoms began about 2-3 days ago.  Patient works in a [employer].  She had this problem before and has had to take Lasix.  She is having no chest pain.  She has had some increasing edema in both legs but no pain there.   Past Medical History:  Diagnosis Date  . CHF (congestive heart failure) ([place])   . COPD (chronic obstructive pulmonary disease) ([place])   . Diabetes mellitus without complication ([place])   . GERD (gastroesophageal reflux disease)   . History of DVT (deep vein thrombosis)   . Hypercholesteremia   . Hyperlipemia   . Normal coronary arteries    by cardiac catheterization which I performed September 2015  . Schizophrenia ([place])   . Tobacco abuse    History reviewed. No pertinent family history. Social History   Socioeconomic History  . Marital status: Single    Spouse name: Not on file  . Number of children: Not on file  . Years of education: Not on file  . Highest education level: Not on file  Social Needs  . Financial resource strain: Not on file  . Food insecurity - worry: Not on file  . Food insecurity - inability: Not on file  . Transportation needs - medical: Not on file  . Transportation needs - non-medical: Not on file  Occupational History  . Not on file  Tobacco Use  . Smoking status: Current Every Day Smoker    Packs/day: 1.00    Years: 21.00    Pack years: 21.00    Types: Cigarettes  . Smokeless tobacco: Never Used  . Tobacco comment: smoking 1/2-1ppd   Substance and Sexual Activity  . Alcohol use: No  . Drug use: No  . Sexual activity: Yes    Birth control/protection: None  Other Topics Concern  . Not on file  Social History Narrative   Resident at [facility],   [place], [place] [address]   No outpatient medications have been marked as taking for the 06/25/17 encounter [facility] Encounter).   Allergies  Allergen Reactions  . Asa [Aspirin] Other (See Comments)    Stomach pain  . Metformin And Related Other (See Comments)    STOMACH PROBLEMS  . Penicillins Nausea And Vomiting    Large doses  . Quetiapine Other (See Comments)    REACTION: nausea/dizzy  . Acetaminophen Nausea And Vomiting and Rash      ROS: As per HPI, remainder of ROS negative.   OBJECTIVE:   Vitals:   06/25/17 1103  BP: 93/70  Pulse: 88  Resp: (!) 24  Temp: 98.5 F (36.9 C)  TempSrc: Oral  SpO2: 93%     General appearance: alert; no distress Eyes: PERRL; EOMI; conjunctiva normal HENT: normocephalic; atraumatic; l; oral mucosa normal Neck: supple Lungs: Diffuse wheezes and rhonchi in both lung fields, anterior and posterior, with some rales in the posterior area. Heart: regular rate and rhythm Back: no CVA tenderness Extremities: no cyanosis; symmetrical with no gross deformities; 1+ edema both pretibial areas Skin: warm and dry Neurologic: normal gait; grossly normal Psychological: alert and cooperative; normal mood and affect      Labs:  Results for orders placed or performed during the hospital encounter of 02/77/41  Basic metabolic panel  Result Value Ref Range   Sodium 135 135 - 145 mmol/L   Potassium 3.2 (L) 3.5 - 5.1 mmol/L   Chloride 97 (L) 101 - 111 mmol/L   CO2 25 22 - 32 mmol/L   Glucose, Bld 85 65 - 99 mg/dL   BUN <5 (L) 6 - 20 mg/dL   Creatinine, Ser 0.67 0.44 - 1.00 mg/dL   Calcium 9.7 8.9 - 10.3 mg/dL   GFR calc non Af Amer >60 >60 mL/min   GFR calc Af Amer >60 >60 mL/min   Anion gap 13 5 - 15  CBC  Result Value Ref Range   WBC 9.8 4.0 - 10.5 K/uL   RBC 4.47 3.87 - 5.11 MIL/uL   Hemoglobin 14.1 12.0 - 15.0 g/dL   HCT 43.0 36.0 - 46.0 %   MCV 96.2 78.0 - 100.0 fL   MCH 31.5 26.0 -  34.0 pg   MCHC 32.8 30.0 - 36.0 g/dL   RDW 13.4 11.5 - 15.5 %   Platelets 249 150 - 400 K/uL  I-stat troponin, ED  Result Value Ref Range   Troponin i, poc 0.00 0.00 - 0.08 ng/mL   Comment 3            Labs Reviewed - No data to display  No results found.     ASSESSMENT & PLAN:  1. COPD exacerbation ([place])     Meds ordered this encounter  Medications  . furosemide (LASIX) 20 MG tablet    Sig: TAKE 1 OR 2 TABLETS BY MOUTH AS NEEDED    Dispense:  60 tablet    Refill:  0    This prescription was filled today(05/12/2015). Any refills authorized will be placed on file.  . predniSONE (DELTASONE) 20 MG tablet    Sig: Two daily with food    Dispense:  10 tablet    Refill:  0    Reviewed expectations re: course of current medical issues. Questions answered. Outlined signs and symptoms indicating need for more acute intervention. Patient verbalized understanding. After Visit Summary given.    Procedures:  Out of work for next two days and needs to be seen prior to returning.     Robyn Haber, MD 06/25/17 1110

## 2017-06-25 NOTE — ED Triage Notes (Signed)
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

## 2017-06-25 NOTE — Discharge Instructions (Signed)
You need to be seen in 2 days to decide if it is okay to go back to work.

## 2017-06-26 DIAGNOSIS — F068 Other specified mental disorders due to known physiological condition: Secondary | ICD-10-CM | POA: Diagnosis not present

## 2017-06-27 ENCOUNTER — Encounter (HOSPITAL_COMMUNITY): Payer: Self-pay | Admitting: Emergency Medicine

## 2017-06-27 ENCOUNTER — Ambulatory Visit (HOSPITAL_COMMUNITY)
Admission: EM | Admit: 2017-06-27 | Discharge: 2017-06-27 | Disposition: A | Discharge status: Home / Self Care | Payer: Medicare HMO | Attending: Family Medicine | Admitting: Family Medicine

## 2017-06-27 ENCOUNTER — Other Ambulatory Visit: Payer: Self-pay

## 2017-06-27 DIAGNOSIS — J441 Chronic obstructive pulmonary disease with (acute) exacerbation: Secondary | ICD-10-CM

## 2017-06-27 DIAGNOSIS — F068 Other specified mental disorders due to known physiological condition: Secondary | ICD-10-CM | POA: Diagnosis not present

## 2017-06-27 NOTE — ED Triage Notes (Signed)
Pt unable to pick up prednisone.

## 2017-06-27 NOTE — ED Triage Notes (Signed)
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

## 2017-06-28 DIAGNOSIS — F068 Other specified mental disorders due to known physiological condition: Secondary | ICD-10-CM | POA: Diagnosis not present

## 2017-06-28 NOTE — ED Provider Notes (Signed)
[place]   [id number] 06/27/17 Arrival Time: 9211  ASSESSMENT & PLAN:  1. COPD exacerbation ([place])    She feels like she is slowly improving. Will fill previous Rx for prednisone tonight or tomorrow. Discussed typical duration of symptoms once she begins prednisone. Work note given. OTC symptom care as needed. Ensure adequate fluid intake and rest. May f/u with PCP or here as needed.  Reviewed expectations re: course of current medical issues. Questions answered. Outlined signs and symptoms indicating need for more acute intervention. Patient verbalized understanding. After Visit Summary given.   SUBJECTIVE: History from: patient.  Kim [name] is a 62 y.o. female who presents with COPD exacerbation. Recent visit here reviewed. She has been unable to fill prednisone secondary to financial constraints. No worsening. Still wheezing but not as SOB. Afebrile. Requests extended work note. "Still feel worn out." Symptoms worse with exertion. OTC treatment: none.  Social History   Tobacco Use  Smoking Status Current Every Day Smoker  . Packs/day: 1.00  . Years: 21.00  . Pack years: 21.00  . Types: Cigarettes  Smokeless Tobacco Never Used  Tobacco Comment   smoking 1/2-1ppd     ROS: As per HPI.   OBJECTIVE:  Vitals:   06/27/17 1345  BP: 118/83  Pulse: 71  Resp: 20  Temp: 98.2 F (36.8 C)  SpO2: 94%     General appearance: alert; appears fatigued HEENT: nasal congestion; clear runny nose; throat irritation secondary to post-nasal drainage Neck: supple without LAD Lungs: unlabored respirations, symmetrical air entry with bilateral wheezing; cough: moderate; no respiratory distress; able to speak in full sentences Skin: warm and dry Psychological: alert and cooperative; normal mood and affect  Allergies  Allergen Reactions  . Asa [Aspirin] Other (See Comments)    Stomach pain  . Metformin And Related Other (See Comments)    STOMACH PROBLEMS  .  Penicillins Nausea And Vomiting    Large doses  . Quetiapine Other (See Comments)    REACTION: nausea/dizzy  . Acetaminophen Nausea And Vomiting and Rash    Past Medical History:  Diagnosis Date  . CHF (congestive heart failure) ([place])   . COPD (chronic obstructive pulmonary disease) ([place])   . Diabetes mellitus without complication ([place])   . GERD (gastroesophageal reflux disease)   . History of DVT (deep vein thrombosis)   . Hypercholesteremia   . Hyperlipemia   . Normal coronary arteries    by cardiac catheterization which I performed September 2015  . Schizophrenia ([place])   . Tobacco abuse    No family history on file. Social History   Socioeconomic History  . Marital status: Single    Spouse name: Not on file  . Number of children: Not on file  . Years of education: Not on file  . Highest education level: Not on file  Social Needs  . Financial resource strain: Not on file  . Food insecurity - worry: Not on file  . Food insecurity - inability: Not on file  . Transportation needs - medical: Not on file  . Transportation needs - non-medical: Not on file  Occupational History  . Not on file  Tobacco Use  . Smoking status: Current Every Day Smoker    Packs/day: 1.00    Years: 21.00    Pack years: 21.00    Types: Cigarettes  . Smokeless tobacco: Never Used  . Tobacco comment: smoking 1/2-1ppd   Substance and Sexual Activity  . Alcohol use: No  . Drug use:  No  . Sexual activity: Yes    Birth control/protection: None  Other Topics Concern  . Not on file  Social History Narrative   Resident at [facility],   [address] [place], [place] [address]           Vanessa Kick, MD 06/28/17 [phone]

## 2017-06-29 DIAGNOSIS — F068 Other specified mental disorders due to known physiological condition: Secondary | ICD-10-CM | POA: Diagnosis not present

## 2017-06-30 DIAGNOSIS — F068 Other specified mental disorders due to known physiological condition: Secondary | ICD-10-CM | POA: Diagnosis not present

## 2017-07-01 DIAGNOSIS — F068 Other specified mental disorders due to known physiological condition: Secondary | ICD-10-CM | POA: Diagnosis not present

## 2017-07-02 DIAGNOSIS — F068 Other specified mental disorders due to known physiological condition: Secondary | ICD-10-CM | POA: Diagnosis not present

## 2017-07-03 DIAGNOSIS — F068 Other specified mental disorders due to known physiological condition: Secondary | ICD-10-CM | POA: Diagnosis not present

## 2017-07-04 DIAGNOSIS — F068 Other specified mental disorders due to known physiological condition: Secondary | ICD-10-CM | POA: Diagnosis not present

## 2017-07-05 DIAGNOSIS — F068 Other specified mental disorders due to known physiological condition: Secondary | ICD-10-CM | POA: Diagnosis not present

## 2017-07-06 DIAGNOSIS — F068 Other specified mental disorders due to known physiological condition: Secondary | ICD-10-CM | POA: Diagnosis not present

## 2017-07-07 DIAGNOSIS — F068 Other specified mental disorders due to known physiological condition: Secondary | ICD-10-CM | POA: Diagnosis not present

## 2017-07-08 DIAGNOSIS — F068 Other specified mental disorders due to known physiological condition: Secondary | ICD-10-CM | POA: Diagnosis not present

## 2017-07-09 DIAGNOSIS — F068 Other specified mental disorders due to known physiological condition: Secondary | ICD-10-CM | POA: Diagnosis not present

## 2017-07-10 DIAGNOSIS — F068 Other specified mental disorders due to known physiological condition: Secondary | ICD-10-CM | POA: Diagnosis not present

## 2017-07-11 DIAGNOSIS — F068 Other specified mental disorders due to known physiological condition: Secondary | ICD-10-CM | POA: Diagnosis not present

## 2017-07-12 DIAGNOSIS — F209 Schizophrenia, unspecified: Secondary | ICD-10-CM | POA: Diagnosis not present

## 2017-07-12 DIAGNOSIS — F068 Other specified mental disorders due to known physiological condition: Secondary | ICD-10-CM | POA: Diagnosis not present

## 2017-07-13 DIAGNOSIS — F068 Other specified mental disorders due to known physiological condition: Secondary | ICD-10-CM | POA: Diagnosis not present

## 2017-07-14 DIAGNOSIS — F068 Other specified mental disorders due to known physiological condition: Secondary | ICD-10-CM | POA: Diagnosis not present

## 2017-07-15 DIAGNOSIS — F068 Other specified mental disorders due to known physiological condition: Secondary | ICD-10-CM | POA: Diagnosis not present

## 2017-07-16 DIAGNOSIS — F068 Other specified mental disorders due to known physiological condition: Secondary | ICD-10-CM | POA: Diagnosis not present

## 2017-07-17 DIAGNOSIS — F068 Other specified mental disorders due to known physiological condition: Secondary | ICD-10-CM | POA: Diagnosis not present

## 2017-07-18 DIAGNOSIS — F068 Other specified mental disorders due to known physiological condition: Secondary | ICD-10-CM | POA: Diagnosis not present

## 2017-07-19 DIAGNOSIS — F068 Other specified mental disorders due to known physiological condition: Secondary | ICD-10-CM | POA: Diagnosis not present

## 2017-07-20 DIAGNOSIS — F068 Other specified mental disorders due to known physiological condition: Secondary | ICD-10-CM | POA: Diagnosis not present

## 2017-07-21 DIAGNOSIS — F068 Other specified mental disorders due to known physiological condition: Secondary | ICD-10-CM | POA: Diagnosis not present

## 2017-07-22 DIAGNOSIS — F068 Other specified mental disorders due to known physiological condition: Secondary | ICD-10-CM | POA: Diagnosis not present

## 2017-07-23 DIAGNOSIS — F068 Other specified mental disorders due to known physiological condition: Secondary | ICD-10-CM | POA: Diagnosis not present

## 2017-07-24 DIAGNOSIS — F068 Other specified mental disorders due to known physiological condition: Secondary | ICD-10-CM | POA: Diagnosis not present

## 2017-07-25 DIAGNOSIS — F068 Other specified mental disorders due to known physiological condition: Secondary | ICD-10-CM | POA: Diagnosis not present

## 2017-07-26 DIAGNOSIS — D119 Benign neoplasm of major salivary gland, unspecified: Secondary | ICD-10-CM | POA: Diagnosis not present

## 2017-07-26 DIAGNOSIS — F068 Other specified mental disorders due to known physiological condition: Secondary | ICD-10-CM | POA: Diagnosis not present

## 2017-07-27 DIAGNOSIS — F068 Other specified mental disorders due to known physiological condition: Secondary | ICD-10-CM | POA: Diagnosis not present

## 2017-07-28 DIAGNOSIS — F068 Other specified mental disorders due to known physiological condition: Secondary | ICD-10-CM | POA: Diagnosis not present

## 2017-07-29 DIAGNOSIS — F068 Other specified mental disorders due to known physiological condition: Secondary | ICD-10-CM | POA: Diagnosis not present

## 2017-07-30 DIAGNOSIS — F068 Other specified mental disorders due to known physiological condition: Secondary | ICD-10-CM | POA: Diagnosis not present

## 2017-07-31 DIAGNOSIS — F068 Other specified mental disorders due to known physiological condition: Secondary | ICD-10-CM | POA: Diagnosis not present

## 2017-08-01 DIAGNOSIS — F068 Other specified mental disorders due to known physiological condition: Secondary | ICD-10-CM | POA: Diagnosis not present

## 2017-08-02 DIAGNOSIS — F068 Other specified mental disorders due to known physiological condition: Secondary | ICD-10-CM | POA: Diagnosis not present

## 2017-08-03 DIAGNOSIS — F068 Other specified mental disorders due to known physiological condition: Secondary | ICD-10-CM | POA: Diagnosis not present

## 2017-08-04 DIAGNOSIS — F068 Other specified mental disorders due to known physiological condition: Secondary | ICD-10-CM | POA: Diagnosis not present

## 2017-08-05 DIAGNOSIS — F068 Other specified mental disorders due to known physiological condition: Secondary | ICD-10-CM | POA: Diagnosis not present

## 2017-08-06 DIAGNOSIS — F068 Other specified mental disorders due to known physiological condition: Secondary | ICD-10-CM | POA: Diagnosis not present

## 2017-08-07 DIAGNOSIS — F068 Other specified mental disorders due to known physiological condition: Secondary | ICD-10-CM | POA: Diagnosis not present

## 2017-08-08 DIAGNOSIS — F068 Other specified mental disorders due to known physiological condition: Secondary | ICD-10-CM | POA: Diagnosis not present

## 2017-08-09 DIAGNOSIS — F068 Other specified mental disorders due to known physiological condition: Secondary | ICD-10-CM | POA: Diagnosis not present

## 2017-08-10 DIAGNOSIS — F068 Other specified mental disorders due to known physiological condition: Secondary | ICD-10-CM | POA: Diagnosis not present

## 2017-08-11 DIAGNOSIS — F068 Other specified mental disorders due to known physiological condition: Secondary | ICD-10-CM | POA: Diagnosis not present

## 2017-08-12 DIAGNOSIS — F068 Other specified mental disorders due to known physiological condition: Secondary | ICD-10-CM | POA: Diagnosis not present

## 2017-08-13 DIAGNOSIS — F068 Other specified mental disorders due to known physiological condition: Secondary | ICD-10-CM | POA: Diagnosis not present

## 2017-08-14 DIAGNOSIS — F068 Other specified mental disorders due to known physiological condition: Secondary | ICD-10-CM | POA: Diagnosis not present

## 2017-08-15 DIAGNOSIS — F068 Other specified mental disorders due to known physiological condition: Secondary | ICD-10-CM | POA: Diagnosis not present

## 2017-08-16 DIAGNOSIS — F068 Other specified mental disorders due to known physiological condition: Secondary | ICD-10-CM | POA: Diagnosis not present

## 2017-08-17 DIAGNOSIS — F068 Other specified mental disorders due to known physiological condition: Secondary | ICD-10-CM | POA: Diagnosis not present

## 2017-08-20 DIAGNOSIS — F068 Other specified mental disorders due to known physiological condition: Secondary | ICD-10-CM | POA: Diagnosis not present

## 2017-08-21 DIAGNOSIS — F068 Other specified mental disorders due to known physiological condition: Secondary | ICD-10-CM | POA: Diagnosis not present

## 2017-08-22 DIAGNOSIS — F068 Other specified mental disorders due to known physiological condition: Secondary | ICD-10-CM | POA: Diagnosis not present

## 2017-08-23 DIAGNOSIS — F068 Other specified mental disorders due to known physiological condition: Secondary | ICD-10-CM | POA: Diagnosis not present

## 2017-08-24 DIAGNOSIS — F068 Other specified mental disorders due to known physiological condition: Secondary | ICD-10-CM | POA: Diagnosis not present

## 2017-08-25 DIAGNOSIS — F068 Other specified mental disorders due to known physiological condition: Secondary | ICD-10-CM | POA: Diagnosis not present

## 2017-08-26 DIAGNOSIS — F068 Other specified mental disorders due to known physiological condition: Secondary | ICD-10-CM | POA: Diagnosis not present

## 2017-08-27 DIAGNOSIS — F068 Other specified mental disorders due to known physiological condition: Secondary | ICD-10-CM | POA: Diagnosis not present

## 2017-08-28 DIAGNOSIS — F068 Other specified mental disorders due to known physiological condition: Secondary | ICD-10-CM | POA: Diagnosis not present

## 2017-08-29 DIAGNOSIS — F068 Other specified mental disorders due to known physiological condition: Secondary | ICD-10-CM | POA: Diagnosis not present

## 2017-08-30 DIAGNOSIS — F068 Other specified mental disorders due to known physiological condition: Secondary | ICD-10-CM | POA: Diagnosis not present

## 2017-08-31 DIAGNOSIS — F068 Other specified mental disorders due to known physiological condition: Secondary | ICD-10-CM | POA: Diagnosis not present

## 2017-09-01 DIAGNOSIS — F068 Other specified mental disorders due to known physiological condition: Secondary | ICD-10-CM | POA: Diagnosis not present

## 2017-09-02 DIAGNOSIS — F068 Other specified mental disorders due to known physiological condition: Secondary | ICD-10-CM | POA: Diagnosis not present

## 2017-09-03 DIAGNOSIS — F068 Other specified mental disorders due to known physiological condition: Secondary | ICD-10-CM | POA: Diagnosis not present

## 2017-09-04 DIAGNOSIS — F068 Other specified mental disorders due to known physiological condition: Secondary | ICD-10-CM | POA: Diagnosis not present

## 2017-09-05 DIAGNOSIS — F068 Other specified mental disorders due to known physiological condition: Secondary | ICD-10-CM | POA: Diagnosis not present

## 2017-09-06 DIAGNOSIS — F068 Other specified mental disorders due to known physiological condition: Secondary | ICD-10-CM | POA: Diagnosis not present

## 2017-09-07 DIAGNOSIS — F068 Other specified mental disorders due to known physiological condition: Secondary | ICD-10-CM | POA: Diagnosis not present

## 2017-09-08 DIAGNOSIS — F068 Other specified mental disorders due to known physiological condition: Secondary | ICD-10-CM | POA: Diagnosis not present

## 2017-09-09 DIAGNOSIS — F068 Other specified mental disorders due to known physiological condition: Secondary | ICD-10-CM | POA: Diagnosis not present

## 2017-09-10 DIAGNOSIS — F068 Other specified mental disorders due to known physiological condition: Secondary | ICD-10-CM | POA: Diagnosis not present

## 2017-09-11 DIAGNOSIS — F068 Other specified mental disorders due to known physiological condition: Secondary | ICD-10-CM | POA: Diagnosis not present

## 2017-09-12 DIAGNOSIS — F068 Other specified mental disorders due to known physiological condition: Secondary | ICD-10-CM | POA: Diagnosis not present

## 2017-09-13 DIAGNOSIS — F068 Other specified mental disorders due to known physiological condition: Secondary | ICD-10-CM | POA: Diagnosis not present

## 2017-09-14 DIAGNOSIS — F068 Other specified mental disorders due to known physiological condition: Secondary | ICD-10-CM | POA: Diagnosis not present

## 2017-09-15 DIAGNOSIS — F068 Other specified mental disorders due to known physiological condition: Secondary | ICD-10-CM | POA: Diagnosis not present

## 2017-09-16 DIAGNOSIS — F068 Other specified mental disorders due to known physiological condition: Secondary | ICD-10-CM | POA: Diagnosis not present

## 2017-09-19 DIAGNOSIS — F068 Other specified mental disorders due to known physiological condition: Secondary | ICD-10-CM | POA: Diagnosis not present

## 2017-09-20 DIAGNOSIS — F068 Other specified mental disorders due to known physiological condition: Secondary | ICD-10-CM | POA: Diagnosis not present

## 2017-09-21 DIAGNOSIS — F068 Other specified mental disorders due to known physiological condition: Secondary | ICD-10-CM | POA: Diagnosis not present

## 2017-09-22 DIAGNOSIS — F068 Other specified mental disorders due to known physiological condition: Secondary | ICD-10-CM | POA: Diagnosis not present

## 2017-09-23 DIAGNOSIS — F068 Other specified mental disorders due to known physiological condition: Secondary | ICD-10-CM | POA: Diagnosis not present

## 2017-09-24 DIAGNOSIS — F068 Other specified mental disorders due to known physiological condition: Secondary | ICD-10-CM | POA: Diagnosis not present

## 2017-09-25 DIAGNOSIS — F068 Other specified mental disorders due to known physiological condition: Secondary | ICD-10-CM | POA: Diagnosis not present

## 2017-09-26 DIAGNOSIS — F209 Schizophrenia, unspecified: Secondary | ICD-10-CM | POA: Diagnosis not present

## 2017-09-26 DIAGNOSIS — F068 Other specified mental disorders due to known physiological condition: Secondary | ICD-10-CM | POA: Diagnosis not present

## 2017-09-27 DIAGNOSIS — F068 Other specified mental disorders due to known physiological condition: Secondary | ICD-10-CM | POA: Diagnosis not present

## 2017-09-28 DIAGNOSIS — F068 Other specified mental disorders due to known physiological condition: Secondary | ICD-10-CM | POA: Diagnosis not present

## 2017-09-29 DIAGNOSIS — F068 Other specified mental disorders due to known physiological condition: Secondary | ICD-10-CM | POA: Diagnosis not present

## 2017-09-30 DIAGNOSIS — F068 Other specified mental disorders due to known physiological condition: Secondary | ICD-10-CM | POA: Diagnosis not present

## 2017-10-01 DIAGNOSIS — F068 Other specified mental disorders due to known physiological condition: Secondary | ICD-10-CM | POA: Diagnosis not present

## 2017-10-02 DIAGNOSIS — F068 Other specified mental disorders due to known physiological condition: Secondary | ICD-10-CM | POA: Diagnosis not present

## 2017-10-03 DIAGNOSIS — F068 Other specified mental disorders due to known physiological condition: Secondary | ICD-10-CM | POA: Diagnosis not present

## 2017-10-04 DIAGNOSIS — F068 Other specified mental disorders due to known physiological condition: Secondary | ICD-10-CM | POA: Diagnosis not present

## 2017-10-05 DIAGNOSIS — F068 Other specified mental disorders due to known physiological condition: Secondary | ICD-10-CM | POA: Diagnosis not present

## 2017-10-06 DIAGNOSIS — F068 Other specified mental disorders due to known physiological condition: Secondary | ICD-10-CM | POA: Diagnosis not present

## 2017-10-07 DIAGNOSIS — F068 Other specified mental disorders due to known physiological condition: Secondary | ICD-10-CM | POA: Diagnosis not present

## 2017-10-08 DIAGNOSIS — F068 Other specified mental disorders due to known physiological condition: Secondary | ICD-10-CM | POA: Diagnosis not present

## 2017-10-09 DIAGNOSIS — F068 Other specified mental disorders due to known physiological condition: Secondary | ICD-10-CM | POA: Diagnosis not present

## 2017-10-10 DIAGNOSIS — F068 Other specified mental disorders due to known physiological condition: Secondary | ICD-10-CM | POA: Diagnosis not present

## 2017-10-11 DIAGNOSIS — F068 Other specified mental disorders due to known physiological condition: Secondary | ICD-10-CM | POA: Diagnosis not present

## 2017-10-12 DIAGNOSIS — F068 Other specified mental disorders due to known physiological condition: Secondary | ICD-10-CM | POA: Diagnosis not present

## 2017-10-13 DIAGNOSIS — F068 Other specified mental disorders due to known physiological condition: Secondary | ICD-10-CM | POA: Diagnosis not present

## 2017-10-14 DIAGNOSIS — F068 Other specified mental disorders due to known physiological condition: Secondary | ICD-10-CM | POA: Diagnosis not present

## 2017-10-15 DIAGNOSIS — F068 Other specified mental disorders due to known physiological condition: Secondary | ICD-10-CM | POA: Diagnosis not present

## 2017-10-16 DIAGNOSIS — F068 Other specified mental disorders due to known physiological condition: Secondary | ICD-10-CM | POA: Diagnosis not present

## 2017-10-17 ENCOUNTER — Ambulatory Visit: Payer: Medicare HMO | Admitting: Adult Health

## 2017-10-20 DIAGNOSIS — F068 Other specified mental disorders due to known physiological condition: Secondary | ICD-10-CM | POA: Diagnosis not present

## 2017-10-21 DIAGNOSIS — F068 Other specified mental disorders due to known physiological condition: Secondary | ICD-10-CM | POA: Diagnosis not present

## 2017-10-22 DIAGNOSIS — F068 Other specified mental disorders due to known physiological condition: Secondary | ICD-10-CM | POA: Diagnosis not present

## 2017-10-23 ENCOUNTER — Ambulatory Visit (INDEPENDENT_AMBULATORY_CARE_PROVIDER_SITE_OTHER): Payer: Medicare HMO | Admitting: Adult Health

## 2017-10-23 ENCOUNTER — Encounter: Payer: Self-pay | Admitting: Adult Health

## 2017-10-23 DIAGNOSIS — F068 Other specified mental disorders due to known physiological condition: Secondary | ICD-10-CM | POA: Diagnosis not present

## 2017-10-23 DIAGNOSIS — J439 Emphysema, unspecified: Secondary | ICD-10-CM

## 2017-10-23 DIAGNOSIS — Z720 Tobacco use: Secondary | ICD-10-CM | POA: Diagnosis not present

## 2017-10-23 MED ORDER — FLUTICASONE-SALMETEROL 250-50 MCG/DOSE IN AEPB: 1.0000 | INHALATION_SPRAY | Freq: Two times a day (BID) | RESPIRATORY_TRACT | 5 refills | Status: DC

## 2017-10-23 MED ORDER — TIOTROPIUM BROMIDE MONOHYDRATE 18 MCG IN CAPS: 18.0000 ug | ORAL_CAPSULE | Freq: Every day | RESPIRATORY_TRACT | 5 refills | Status: DC

## 2017-10-23 MED ORDER — ALBUTEROL SULFATE HFA 108 (90 BASE) MCG/ACT IN AERS: 2.0000 | INHALATION_SPRAY | RESPIRATORY_TRACT | 5 refills | Status: DC | PRN

## 2017-10-23 NOTE — Progress Notes (Signed)
@Patient  ID: Kim [name], female    DOB: [birth year], 62 y.o.   MRN: [id number]  Chief Complaint  Patient presents with  . Follow-up    Referring provider: [name], FNP  HPI: 62 yo female smoker followed for COPD  Hx of DVT prev on Xarelto x 6 mon . 2015 History of a left pleural effusion. 2010, most likely from a parapneumonic process.  She has Schizophrenia and lives in a group home.  TEST  She presented with Left Pleural effusion 07/2008 -Underwent Left thoracentesis x 1050 09/18/2008> ex with 79% lymphs, 16% eos WBC 3610 Last seen 6/10 >>eos 10.5 % with ESR only 17 not specific but this probably represents a late parapneumonic process with organization/ fibrotic change.  2015 2-D echo showed normal systolic function with an ejection fracture 55%, and a grade 1 diastolic dysfunction.  -Duplex 03/2014 LLE DVT Spirometry 2015 FEV1 69%, FVC 78%, ratio 71 2015 Did not desaturate on walking Spirometry 09/05/2016 FEV1 47%, ratio 62, FVC 59%   10/23/2017 Follow up : COPD  Pt returns for a six-month follow-up.  Patient has underlying COPD and is prone to recurrent exacerbations.  Continues to smoke 1 pack of cigarettes a day.  Smoking cessation was discussed.  Last visit she had been instructed that she could discontinue Spiriva and remain on Advair.  However she had to exacerbations in February of this year.  She was seen in the emergency room.  And instructed to restart Spiriva.  Patient says since then she is been doing okay.  She had no flare of cough or wheezing. He denies any hemoptysis, chest pain, orthopnea. Pneumovax vaccine is up-to-date.  Discussed Prevnar.  She declined. Discussed low-dose CT chest screening program.  She is interested.    Allergies  Allergen Reactions  . Asa [Aspirin] Other (See Comments)    Stomach pain  . Metformin And Related Other (See Comments)    STOMACH PROBLEMS  . Penicillins Nausea And Vomiting    Large doses  . Quetiapine Other  (See Comments)    REACTION: nausea/dizzy  . Acetaminophen Nausea And Vomiting and Rash    Immunization History  Administered Date(s) Administered  . Influenza,inj,Quad PF,6+ Mos 02/05/2014  . Pneumococcal Polysaccharide-23 02/05/2014    Past Medical History:  Diagnosis Date  . CHF (congestive heart failure) ([place])   . COPD (chronic obstructive pulmonary disease) ([place])   . Diabetes mellitus without complication ([place])   . GERD (gastroesophageal reflux disease)   . History of DVT (deep vein thrombosis)   . Hypercholesteremia   . Hyperlipemia   . Normal coronary arteries    by cardiac catheterization which I performed September 2015  . Schizophrenia ([place])   . Tobacco abuse     Tobacco History: Social History   Tobacco Use  Smoking Status Current Every Day Smoker  . Packs/day: 1.00  . Years: 21.00  . Pack years: 21.00  . Types: Cigarettes  Smokeless Tobacco Never Used  Tobacco Comment   smoking 1/2-1ppd    Ready to quit: Not Answered Counseling given: Not Answered Comment: smoking 1/2-1ppd    Outpatient Encounter Medications as of 10/23/2017  Medication Sig  . albuterol (VENTOLIN HFA) 108 (90 Base) MCG/ACT inhaler Inhale 2 puffs into the lungs every 4 (four) hours as needed for wheezing or shortness of breath.  . Fluticasone-Salmeterol (ADVAIR) 250-50 MCG/DOSE AEPB Inhale 1 puff into the lungs 2 (two) times daily.  . furosemide (LASIX) 20 MG tablet TAKE 1 OR 2 TABLETS BY  MOUTH AS NEEDED  . risperiDONE (RISPERDAL) 2 MG tablet Take 2 mg by mouth at bedtime.  [name] tiotropium (SPIRIVA) 18 MCG inhalation capsule Place 18 mcg into inhaler and inhale daily.  [name] aspirin 81 MG chewable tablet Chew by mouth daily.  . cholecalciferol (VITAMIN D) 1000 units tablet Take 1,000 Units by mouth daily.  . [DISCONTINUED] predniSONE (DELTASONE) 20 MG tablet Two daily with food (Patient not taking: Reported on 10/23/2017)   No facility-administered encounter medications on file as of 10/23/2017.        Review of Systems  Constitutional:   No  weight loss, night sweats,  Fevers, chills, f +fatigue, or  lassitude.  HEENT:   No headaches,  Difficulty swallowing,  Tooth/dental problems, or  Sore throat,                No sneezing, itching, ear ache, nasal congestion, post nasal drip,   CV:  No chest pain,  Orthopnea, PND, swelling in lower extremities, anasarca, dizziness, palpitations, syncope.   GI  No heartburn, indigestion, abdominal pain, nausea, vomiting, diarrhea, change in bowel habits, loss of appetite, bloody stools.   Resp:  No chest wall deformity  Skin: no rash or lesions.  GU: no dysuria, change in color of urine, no urgency or frequency.  No flank pain, no hematuria   MS:  No joint pain or swelling.  No decreased range of motion.  No back pain.    Physical Exam  BP 132/84 (BP Location: Right Arm, Cuff Size: Normal)   Pulse 63   Ht 5' 4.5" (1.638 m)   Wt 174 lb 9.6 oz (79.2 kg)   SpO2 95%   BMI 29.51 kg/m   GEN: A/Ox3; pleasant , NAD, well nourished    HEENT:  [place]/AT,  EACs-clear, TMs-wnl, NOSE-clear, THROAT-clear, no lesions, no postnasal drip or exudate noted.   NECK:  Supple w/ fair ROM; no JVD; normal carotid impulses w/o bruits; no thyromegaly or nodules palpated; no lymphadenopathy.    RESP decreased breath sounds in the bases no accessory muscle use, no dullness to percussion  CARD:  RRR, no m/r/g, no peripheral edema, pulses intact, no cyanosis or clubbing.  GI:   Soft & nt; nml bowel sounds; no organomegaly or masses detected.   Musco: Warm bil, no deformities or joint swelling noted.   Neuro: alert, no focal deficits noted.    Skin: Warm, no lesions or rashes    Lab Results:  CBC    BMET    BNP No results found for: BNP  ProBNPlImaging: No results found.   Assessment & Plan:   COPD with emphysema Prone to recurrent exacerbation and active smoker.  Patient is improved on Advair and Spiriva. Continue current  regimen. Smoking cessation discussed. Refer for low-dose CT screening  Plan  Patient Instructions  Continue on Advair and Spiriva . Rinse after use.  Refer to [facility] for LDCT chest screening program .  Work on not smoking .  Follow up with Dr. Elsworth Soho  In 6 months and As needed         Tobacco abuse Smoking cessation      Rexene Edison, NP 10/23/2017

## 2017-10-23 NOTE — Addendum Note (Signed)
Addended by: Parke Poisson E on: 10/23/2017 02:52 PM   Modules accepted: Orders

## 2017-10-23 NOTE — Patient Instructions (Addendum)
Continue on Advair and Spiriva . Rinse after use.  Refer to [facility] for LDCT chest screening program .  Work on not smoking .  Follow up with Dr. Elsworth Soho  In 6 months and As needed

## 2017-10-23 NOTE — Assessment & Plan Note (Signed)
Smoking cessation  

## 2017-10-23 NOTE — Assessment & Plan Note (Signed)
Prone to recurrent exacerbation and active smoker.  Patient is improved on Advair and Spiriva. Continue current regimen. Smoking cessation discussed. Refer for low-dose CT screening  Plan  Patient Instructions  Continue on Advair and Spiriva . Rinse after use.  Refer to [facility] for LDCT chest screening program .  Work on not smoking .  Follow up with Dr. Elsworth Soho  In 6 months and As needed

## 2017-10-24 DIAGNOSIS — F068 Other specified mental disorders due to known physiological condition: Secondary | ICD-10-CM | POA: Diagnosis not present

## 2017-10-25 DIAGNOSIS — F068 Other specified mental disorders due to known physiological condition: Secondary | ICD-10-CM | POA: Diagnosis not present

## 2017-10-26 DIAGNOSIS — F068 Other specified mental disorders due to known physiological condition: Secondary | ICD-10-CM | POA: Diagnosis not present

## 2017-10-27 DIAGNOSIS — F068 Other specified mental disorders due to known physiological condition: Secondary | ICD-10-CM | POA: Diagnosis not present

## 2017-10-28 DIAGNOSIS — F068 Other specified mental disorders due to known physiological condition: Secondary | ICD-10-CM | POA: Diagnosis not present

## 2017-10-29 DIAGNOSIS — F068 Other specified mental disorders due to known physiological condition: Secondary | ICD-10-CM | POA: Diagnosis not present

## 2017-10-30 DIAGNOSIS — F068 Other specified mental disorders due to known physiological condition: Secondary | ICD-10-CM | POA: Diagnosis not present

## 2017-10-31 DIAGNOSIS — F068 Other specified mental disorders due to known physiological condition: Secondary | ICD-10-CM | POA: Diagnosis not present

## 2017-11-01 DIAGNOSIS — F068 Other specified mental disorders due to known physiological condition: Secondary | ICD-10-CM | POA: Diagnosis not present

## 2017-11-02 DIAGNOSIS — F068 Other specified mental disorders due to known physiological condition: Secondary | ICD-10-CM | POA: Diagnosis not present

## 2017-11-03 DIAGNOSIS — F068 Other specified mental disorders due to known physiological condition: Secondary | ICD-10-CM | POA: Diagnosis not present

## 2017-11-04 DIAGNOSIS — F068 Other specified mental disorders due to known physiological condition: Secondary | ICD-10-CM | POA: Diagnosis not present

## 2017-11-05 DIAGNOSIS — F068 Other specified mental disorders due to known physiological condition: Secondary | ICD-10-CM | POA: Diagnosis not present

## 2017-11-06 DIAGNOSIS — F068 Other specified mental disorders due to known physiological condition: Secondary | ICD-10-CM | POA: Diagnosis not present

## 2017-11-07 DIAGNOSIS — F068 Other specified mental disorders due to known physiological condition: Secondary | ICD-10-CM | POA: Diagnosis not present

## 2017-11-08 DIAGNOSIS — F068 Other specified mental disorders due to known physiological condition: Secondary | ICD-10-CM | POA: Diagnosis not present

## 2017-11-09 DIAGNOSIS — F068 Other specified mental disorders due to known physiological condition: Secondary | ICD-10-CM | POA: Diagnosis not present

## 2017-11-10 DIAGNOSIS — F068 Other specified mental disorders due to known physiological condition: Secondary | ICD-10-CM | POA: Diagnosis not present

## 2017-11-11 DIAGNOSIS — F068 Other specified mental disorders due to known physiological condition: Secondary | ICD-10-CM | POA: Diagnosis not present

## 2017-11-12 DIAGNOSIS — F068 Other specified mental disorders due to known physiological condition: Secondary | ICD-10-CM | POA: Diagnosis not present

## 2017-11-13 DIAGNOSIS — F068 Other specified mental disorders due to known physiological condition: Secondary | ICD-10-CM | POA: Diagnosis not present

## 2017-11-14 DIAGNOSIS — F068 Other specified mental disorders due to known physiological condition: Secondary | ICD-10-CM | POA: Diagnosis not present

## 2017-11-15 DIAGNOSIS — F068 Other specified mental disorders due to known physiological condition: Secondary | ICD-10-CM | POA: Diagnosis not present

## 2017-11-16 ENCOUNTER — Telehealth: Payer: Self-pay | Admitting: Acute Care

## 2017-11-16 DIAGNOSIS — F068 Other specified mental disorders due to known physiological condition: Secondary | ICD-10-CM | POA: Diagnosis not present

## 2017-11-19 DIAGNOSIS — F068 Other specified mental disorders due to known physiological condition: Secondary | ICD-10-CM | POA: Diagnosis not present

## 2017-11-19 NOTE — Telephone Encounter (Signed)
Spoke with Josephine (Pt contact at group home). She is going to talk with pt to clarify her smoking history. Will call back. Will close this message and refer to referral notes.

## 2017-11-20 DIAGNOSIS — F068 Other specified mental disorders due to known physiological condition: Secondary | ICD-10-CM | POA: Diagnosis not present

## 2017-11-21 DIAGNOSIS — F068 Other specified mental disorders due to known physiological condition: Secondary | ICD-10-CM | POA: Diagnosis not present

## 2017-11-22 DIAGNOSIS — F068 Other specified mental disorders due to known physiological condition: Secondary | ICD-10-CM | POA: Diagnosis not present

## 2017-11-23 DIAGNOSIS — F068 Other specified mental disorders due to known physiological condition: Secondary | ICD-10-CM | POA: Diagnosis not present

## 2017-11-24 DIAGNOSIS — F068 Other specified mental disorders due to known physiological condition: Secondary | ICD-10-CM | POA: Diagnosis not present

## 2017-11-25 DIAGNOSIS — F068 Other specified mental disorders due to known physiological condition: Secondary | ICD-10-CM | POA: Diagnosis not present

## 2017-11-26 DIAGNOSIS — F068 Other specified mental disorders due to known physiological condition: Secondary | ICD-10-CM | POA: Diagnosis not present

## 2017-11-27 DIAGNOSIS — F068 Other specified mental disorders due to known physiological condition: Secondary | ICD-10-CM | POA: Diagnosis not present

## 2017-11-28 DIAGNOSIS — F068 Other specified mental disorders due to known physiological condition: Secondary | ICD-10-CM | POA: Diagnosis not present

## 2017-11-29 ENCOUNTER — Other Ambulatory Visit: Payer: Self-pay | Admitting: Acute Care

## 2017-11-29 DIAGNOSIS — Z122 Encounter for screening for malignant neoplasm of respiratory organs: Secondary | ICD-10-CM

## 2017-11-29 DIAGNOSIS — F17210 Nicotine dependence, cigarettes, uncomplicated: Principal | ICD-10-CM

## 2017-11-29 DIAGNOSIS — F068 Other specified mental disorders due to known physiological condition: Secondary | ICD-10-CM | POA: Diagnosis not present

## 2017-11-30 DIAGNOSIS — F068 Other specified mental disorders due to known physiological condition: Secondary | ICD-10-CM | POA: Diagnosis not present

## 2017-12-01 DIAGNOSIS — F068 Other specified mental disorders due to known physiological condition: Secondary | ICD-10-CM | POA: Diagnosis not present

## 2017-12-02 DIAGNOSIS — F068 Other specified mental disorders due to known physiological condition: Secondary | ICD-10-CM | POA: Diagnosis not present

## 2017-12-03 DIAGNOSIS — F068 Other specified mental disorders due to known physiological condition: Secondary | ICD-10-CM | POA: Diagnosis not present

## 2017-12-04 DIAGNOSIS — F068 Other specified mental disorders due to known physiological condition: Secondary | ICD-10-CM | POA: Diagnosis not present

## 2017-12-05 DIAGNOSIS — F068 Other specified mental disorders due to known physiological condition: Secondary | ICD-10-CM | POA: Diagnosis not present

## 2017-12-06 DIAGNOSIS — F068 Other specified mental disorders due to known physiological condition: Secondary | ICD-10-CM | POA: Diagnosis not present

## 2017-12-07 ENCOUNTER — Inpatient Hospital Stay: Admission: RE | Admit: 2017-12-07 | Payer: Medicare HMO | Source: Ambulatory Visit

## 2017-12-07 ENCOUNTER — Encounter: Payer: Medicare HMO | Admitting: Acute Care

## 2017-12-07 DIAGNOSIS — F068 Other specified mental disorders due to known physiological condition: Secondary | ICD-10-CM | POA: Diagnosis not present

## 2017-12-08 DIAGNOSIS — F068 Other specified mental disorders due to known physiological condition: Secondary | ICD-10-CM | POA: Diagnosis not present

## 2017-12-09 DIAGNOSIS — F068 Other specified mental disorders due to known physiological condition: Secondary | ICD-10-CM | POA: Diagnosis not present

## 2017-12-10 DIAGNOSIS — F068 Other specified mental disorders due to known physiological condition: Secondary | ICD-10-CM | POA: Diagnosis not present

## 2017-12-11 DIAGNOSIS — F068 Other specified mental disorders due to known physiological condition: Secondary | ICD-10-CM | POA: Diagnosis not present

## 2017-12-12 DIAGNOSIS — F068 Other specified mental disorders due to known physiological condition: Secondary | ICD-10-CM | POA: Diagnosis not present

## 2017-12-13 DIAGNOSIS — F068 Other specified mental disorders due to known physiological condition: Secondary | ICD-10-CM | POA: Diagnosis not present

## 2017-12-14 DIAGNOSIS — F068 Other specified mental disorders due to known physiological condition: Secondary | ICD-10-CM | POA: Diagnosis not present

## 2017-12-15 DIAGNOSIS — F068 Other specified mental disorders due to known physiological condition: Secondary | ICD-10-CM | POA: Diagnosis not present

## 2017-12-16 DIAGNOSIS — F068 Other specified mental disorders due to known physiological condition: Secondary | ICD-10-CM | POA: Diagnosis not present

## 2017-12-20 DIAGNOSIS — F068 Other specified mental disorders due to known physiological condition: Secondary | ICD-10-CM | POA: Diagnosis not present

## 2017-12-21 DIAGNOSIS — F068 Other specified mental disorders due to known physiological condition: Secondary | ICD-10-CM | POA: Diagnosis not present

## 2017-12-22 DIAGNOSIS — F068 Other specified mental disorders due to known physiological condition: Secondary | ICD-10-CM | POA: Diagnosis not present

## 2017-12-23 DIAGNOSIS — F068 Other specified mental disorders due to known physiological condition: Secondary | ICD-10-CM | POA: Diagnosis not present

## 2017-12-24 DIAGNOSIS — F068 Other specified mental disorders due to known physiological condition: Secondary | ICD-10-CM | POA: Diagnosis not present

## 2017-12-25 DIAGNOSIS — F068 Other specified mental disorders due to known physiological condition: Secondary | ICD-10-CM | POA: Diagnosis not present

## 2017-12-26 DIAGNOSIS — F068 Other specified mental disorders due to known physiological condition: Secondary | ICD-10-CM | POA: Diagnosis not present

## 2017-12-27 DIAGNOSIS — F068 Other specified mental disorders due to known physiological condition: Secondary | ICD-10-CM | POA: Diagnosis not present

## 2017-12-28 DIAGNOSIS — F068 Other specified mental disorders due to known physiological condition: Secondary | ICD-10-CM | POA: Diagnosis not present

## 2017-12-29 DIAGNOSIS — F068 Other specified mental disorders due to known physiological condition: Secondary | ICD-10-CM | POA: Diagnosis not present

## 2017-12-30 DIAGNOSIS — F068 Other specified mental disorders due to known physiological condition: Secondary | ICD-10-CM | POA: Diagnosis not present

## 2017-12-31 ENCOUNTER — Telehealth: Payer: Self-pay | Admitting: Adult Health

## 2017-12-31 DIAGNOSIS — F068 Other specified mental disorders due to known physiological condition: Secondary | ICD-10-CM | POA: Diagnosis not present

## 2017-12-31 NOTE — Telephone Encounter (Signed)
FYI - I spoke with Reginold Agent (group home coordinator).  She spoke with Kim [name] regarding lung cancer screening and she states that Kim [name] does not wish to participate at this time.  Referral has been cancelled.

## 2018-01-01 DIAGNOSIS — F068 Other specified mental disorders due to known physiological condition: Secondary | ICD-10-CM | POA: Diagnosis not present

## 2018-01-02 DIAGNOSIS — F068 Other specified mental disorders due to known physiological condition: Secondary | ICD-10-CM | POA: Diagnosis not present

## 2018-01-03 DIAGNOSIS — F068 Other specified mental disorders due to known physiological condition: Secondary | ICD-10-CM | POA: Diagnosis not present

## 2018-01-04 DIAGNOSIS — F068 Other specified mental disorders due to known physiological condition: Secondary | ICD-10-CM | POA: Diagnosis not present

## 2018-01-05 DIAGNOSIS — F068 Other specified mental disorders due to known physiological condition: Secondary | ICD-10-CM | POA: Diagnosis not present

## 2018-01-06 DIAGNOSIS — F068 Other specified mental disorders due to known physiological condition: Secondary | ICD-10-CM | POA: Diagnosis not present

## 2018-01-07 DIAGNOSIS — F068 Other specified mental disorders due to known physiological condition: Secondary | ICD-10-CM | POA: Diagnosis not present

## 2018-01-08 DIAGNOSIS — F068 Other specified mental disorders due to known physiological condition: Secondary | ICD-10-CM | POA: Diagnosis not present

## 2018-01-09 DIAGNOSIS — F068 Other specified mental disorders due to known physiological condition: Secondary | ICD-10-CM | POA: Diagnosis not present

## 2018-01-10 DIAGNOSIS — F068 Other specified mental disorders due to known physiological condition: Secondary | ICD-10-CM | POA: Diagnosis not present

## 2018-01-11 DIAGNOSIS — F068 Other specified mental disorders due to known physiological condition: Secondary | ICD-10-CM | POA: Diagnosis not present

## 2018-01-12 DIAGNOSIS — F068 Other specified mental disorders due to known physiological condition: Secondary | ICD-10-CM | POA: Diagnosis not present

## 2018-01-13 DIAGNOSIS — F068 Other specified mental disorders due to known physiological condition: Secondary | ICD-10-CM | POA: Diagnosis not present

## 2018-01-14 DIAGNOSIS — F068 Other specified mental disorders due to known physiological condition: Secondary | ICD-10-CM | POA: Diagnosis not present

## 2018-01-15 DIAGNOSIS — F068 Other specified mental disorders due to known physiological condition: Secondary | ICD-10-CM | POA: Diagnosis not present

## 2018-01-16 DIAGNOSIS — F068 Other specified mental disorders due to known physiological condition: Secondary | ICD-10-CM | POA: Diagnosis not present

## 2018-01-17 DIAGNOSIS — F068 Other specified mental disorders due to known physiological condition: Secondary | ICD-10-CM | POA: Diagnosis not present

## 2018-01-18 DIAGNOSIS — F068 Other specified mental disorders due to known physiological condition: Secondary | ICD-10-CM | POA: Diagnosis not present

## 2018-01-19 DIAGNOSIS — F068 Other specified mental disorders due to known physiological condition: Secondary | ICD-10-CM | POA: Diagnosis not present

## 2018-01-20 DIAGNOSIS — F068 Other specified mental disorders due to known physiological condition: Secondary | ICD-10-CM | POA: Diagnosis not present

## 2018-01-21 DIAGNOSIS — F068 Other specified mental disorders due to known physiological condition: Secondary | ICD-10-CM | POA: Diagnosis not present

## 2018-01-22 DIAGNOSIS — F068 Other specified mental disorders due to known physiological condition: Secondary | ICD-10-CM | POA: Diagnosis not present

## 2018-01-23 DIAGNOSIS — F068 Other specified mental disorders due to known physiological condition: Secondary | ICD-10-CM | POA: Diagnosis not present

## 2018-01-24 DIAGNOSIS — F068 Other specified mental disorders due to known physiological condition: Secondary | ICD-10-CM | POA: Diagnosis not present

## 2018-01-25 DIAGNOSIS — F068 Other specified mental disorders due to known physiological condition: Secondary | ICD-10-CM | POA: Diagnosis not present

## 2018-01-26 DIAGNOSIS — F068 Other specified mental disorders due to known physiological condition: Secondary | ICD-10-CM | POA: Diagnosis not present

## 2018-01-27 DIAGNOSIS — F068 Other specified mental disorders due to known physiological condition: Secondary | ICD-10-CM | POA: Diagnosis not present

## 2018-01-28 DIAGNOSIS — F068 Other specified mental disorders due to known physiological condition: Secondary | ICD-10-CM | POA: Diagnosis not present

## 2018-01-29 DIAGNOSIS — F068 Other specified mental disorders due to known physiological condition: Secondary | ICD-10-CM | POA: Diagnosis not present

## 2018-01-30 DIAGNOSIS — F068 Other specified mental disorders due to known physiological condition: Secondary | ICD-10-CM | POA: Diagnosis not present

## 2018-01-31 DIAGNOSIS — F068 Other specified mental disorders due to known physiological condition: Secondary | ICD-10-CM | POA: Diagnosis not present

## 2018-02-01 DIAGNOSIS — F068 Other specified mental disorders due to known physiological condition: Secondary | ICD-10-CM | POA: Diagnosis not present

## 2018-02-09 DIAGNOSIS — F068 Other specified mental disorders due to known physiological condition: Secondary | ICD-10-CM | POA: Diagnosis not present

## 2018-02-10 DIAGNOSIS — F068 Other specified mental disorders due to known physiological condition: Secondary | ICD-10-CM | POA: Diagnosis not present

## 2018-02-11 DIAGNOSIS — F068 Other specified mental disorders due to known physiological condition: Secondary | ICD-10-CM | POA: Diagnosis not present

## 2018-02-12 DIAGNOSIS — F068 Other specified mental disorders due to known physiological condition: Secondary | ICD-10-CM | POA: Diagnosis not present

## 2018-02-13 DIAGNOSIS — F068 Other specified mental disorders due to known physiological condition: Secondary | ICD-10-CM | POA: Diagnosis not present

## 2018-02-14 DIAGNOSIS — F068 Other specified mental disorders due to known physiological condition: Secondary | ICD-10-CM | POA: Diagnosis not present

## 2018-02-15 DIAGNOSIS — F068 Other specified mental disorders due to known physiological condition: Secondary | ICD-10-CM | POA: Diagnosis not present

## 2018-02-16 DIAGNOSIS — F068 Other specified mental disorders due to known physiological condition: Secondary | ICD-10-CM | POA: Diagnosis not present

## 2018-02-19 DIAGNOSIS — F068 Other specified mental disorders due to known physiological condition: Secondary | ICD-10-CM | POA: Diagnosis not present

## 2018-02-20 DIAGNOSIS — F068 Other specified mental disorders due to known physiological condition: Secondary | ICD-10-CM | POA: Diagnosis not present

## 2018-02-21 DIAGNOSIS — F068 Other specified mental disorders due to known physiological condition: Secondary | ICD-10-CM | POA: Diagnosis not present

## 2018-02-22 DIAGNOSIS — F068 Other specified mental disorders due to known physiological condition: Secondary | ICD-10-CM | POA: Diagnosis not present

## 2018-02-23 DIAGNOSIS — F068 Other specified mental disorders due to known physiological condition: Secondary | ICD-10-CM | POA: Diagnosis not present

## 2018-02-24 DIAGNOSIS — F068 Other specified mental disorders due to known physiological condition: Secondary | ICD-10-CM | POA: Diagnosis not present

## 2018-02-25 DIAGNOSIS — F068 Other specified mental disorders due to known physiological condition: Secondary | ICD-10-CM | POA: Diagnosis not present

## 2018-02-26 DIAGNOSIS — F068 Other specified mental disorders due to known physiological condition: Secondary | ICD-10-CM | POA: Diagnosis not present

## 2018-02-27 DIAGNOSIS — F068 Other specified mental disorders due to known physiological condition: Secondary | ICD-10-CM | POA: Diagnosis not present

## 2018-02-28 DIAGNOSIS — F068 Other specified mental disorders due to known physiological condition: Secondary | ICD-10-CM | POA: Diagnosis not present

## 2018-03-01 DIAGNOSIS — F068 Other specified mental disorders due to known physiological condition: Secondary | ICD-10-CM | POA: Diagnosis not present

## 2018-03-02 DIAGNOSIS — F068 Other specified mental disorders due to known physiological condition: Secondary | ICD-10-CM | POA: Diagnosis not present

## 2018-03-03 DIAGNOSIS — F068 Other specified mental disorders due to known physiological condition: Secondary | ICD-10-CM | POA: Diagnosis not present

## 2018-03-04 DIAGNOSIS — F068 Other specified mental disorders due to known physiological condition: Secondary | ICD-10-CM | POA: Diagnosis not present

## 2018-03-05 DIAGNOSIS — F068 Other specified mental disorders due to known physiological condition: Secondary | ICD-10-CM | POA: Diagnosis not present

## 2018-03-06 DIAGNOSIS — F068 Other specified mental disorders due to known physiological condition: Secondary | ICD-10-CM | POA: Diagnosis not present

## 2018-03-07 DIAGNOSIS — F068 Other specified mental disorders due to known physiological condition: Secondary | ICD-10-CM | POA: Diagnosis not present

## 2018-03-08 DIAGNOSIS — F068 Other specified mental disorders due to known physiological condition: Secondary | ICD-10-CM | POA: Diagnosis not present

## 2018-03-09 DIAGNOSIS — F068 Other specified mental disorders due to known physiological condition: Secondary | ICD-10-CM | POA: Diagnosis not present

## 2018-03-10 DIAGNOSIS — F068 Other specified mental disorders due to known physiological condition: Secondary | ICD-10-CM | POA: Diagnosis not present

## 2018-03-11 DIAGNOSIS — F068 Other specified mental disorders due to known physiological condition: Secondary | ICD-10-CM | POA: Diagnosis not present

## 2018-03-12 DIAGNOSIS — F068 Other specified mental disorders due to known physiological condition: Secondary | ICD-10-CM | POA: Diagnosis not present

## 2018-03-13 ENCOUNTER — Ambulatory Visit (INDEPENDENT_AMBULATORY_CARE_PROVIDER_SITE_OTHER): Payer: Medicare HMO | Admitting: Nurse Practitioner

## 2018-03-13 ENCOUNTER — Encounter: Payer: Self-pay | Admitting: Nurse Practitioner

## 2018-03-13 VITALS — BP 100/70 | HR 68 | Temp 97.5°F | Ht 64.0 in | Wt 175.0 lb

## 2018-03-13 DIAGNOSIS — I509 Heart failure, unspecified: Secondary | ICD-10-CM

## 2018-03-13 DIAGNOSIS — R7303 Prediabetes: Secondary | ICD-10-CM | POA: Diagnosis not present

## 2018-03-13 DIAGNOSIS — Z720 Tobacco use: Secondary | ICD-10-CM | POA: Diagnosis not present

## 2018-03-13 DIAGNOSIS — J449 Chronic obstructive pulmonary disease, unspecified: Secondary | ICD-10-CM

## 2018-03-13 DIAGNOSIS — F068 Other specified mental disorders due to known physiological condition: Secondary | ICD-10-CM | POA: Diagnosis not present

## 2018-03-13 MED ORDER — TIOTROPIUM BROMIDE MONOHYDRATE 18 MCG IN CAPS: 18.0000 ug | ORAL_CAPSULE | Freq: Every day | RESPIRATORY_TRACT | 5 refills | Status: DC

## 2018-03-13 MED ORDER — ASPIRIN 81 MG PO CHEW: 81.0000 mg | CHEWABLE_TABLET | Freq: Every day | ORAL | 1 refills | Status: DC

## 2018-03-13 MED ORDER — FLUTICASONE-SALMETEROL 250-50 MCG/DOSE IN AEPB: 1.0000 | INHALATION_SPRAY | Freq: Two times a day (BID) | RESPIRATORY_TRACT | 5 refills | Status: DC

## 2018-03-13 MED ORDER — FUROSEMIDE 20 MG PO TABS
ORAL_TABLET | ORAL | 1 refills | Status: DC
Start: 2018-03-13 — End: 2018-08-08

## 2018-03-13 NOTE — Progress Notes (Signed)
Subjective:     Patient ID: Kim [name] , female    DOB: Nov 16, 1955 , 62 y.o.   MRN: [id number]   Diabetes  She presents for her follow-up diabetic visit. She has type 2 diabetes mellitus. Pertinent negatives for hypoglycemia include no dizziness or headaches. Pertinent negatives for diabetes include no chest pain, no polydipsia, no polyphagia and no polyuria. There are no hypoglycemic complications. There are no diabetic complications. Risk factors for coronary artery disease include diabetes mellitus, obesity, sedentary lifestyle and tobacco exposure. Current diabetic treatment includes diet. She is compliant with treatment some of the time. She is following a generally healthy diet. She has not had a previous visit with a dietitian. She rarely participates in exercise. There is no change in her home blood glucose trend. (Last week was 90- her glucometer is no longer working needs a new one.) An ACE inhibitor/angiotensin II receptor blocker is not being taken. Eye exam is not current.     Past Medical History:  Diagnosis Date  . CHF (congestive heart failure) ([place])   . COPD (chronic obstructive pulmonary disease) ([place])   . Diabetes mellitus without complication ([place])   . GERD (gastroesophageal reflux disease)   . History of DVT (deep vein thrombosis)   . Hypercholesteremia   . Hyperlipemia   . Normal coronary arteries    by cardiac catheterization which I performed September 2015  . Schizophrenia ([place])   . Tobacco abuse       Current Outpatient Medications:  .  albuterol (VENTOLIN HFA) 108 (90 Base) MCG/ACT inhaler, Inhale 2 puffs into the lungs every 4 (four) hours as needed for wheezing or shortness of breath., Disp: 18 g, Rfl: 5 .  aspirin 81 MG chewable tablet, Chew by mouth daily., Disp: , Rfl:  .  cholecalciferol (VITAMIN D) 1000 units tablet, Take 1,000 Units by mouth daily., Disp: , Rfl:  .  Fluticasone-Salmeterol (ADVAIR) 250-50 MCG/DOSE AEPB, Inhale 1 puff into the lungs 2  (two) times daily., Disp: 60 each, Rfl: 5 .  furosemide (LASIX) 20 MG tablet, TAKE 1 OR 2 TABLETS BY MOUTH AS NEEDED, Disp: 60 tablet, Rfl: 0 .  risperiDONE (RISPERDAL) 2 MG tablet, Take 2 mg by mouth at bedtime., Disp: , Rfl:  .  tiotropium (SPIRIVA) 18 MCG inhalation capsule, Place 1 capsule (18 mcg total) into inhaler and inhale daily., Disp: 30 capsule, Rfl: 5   Allergies  Allergen Reactions  . Asa [Aspirin] Other (See Comments)    Stomach pain  . Metformin And Related Other (See Comments)    STOMACH PROBLEMS  . Penicillins Nausea And Vomiting    Large doses  . Quetiapine Other (See Comments)    REACTION: nausea/dizzy  . Acetaminophen Nausea And Vomiting and Rash     Review of Systems  Constitutional: Negative.   HENT: Negative.   Respiratory: Negative.   Cardiovascular: Negative.  Negative for chest pain.  Endocrine: Negative for polydipsia, polyphagia and polyuria.  Musculoskeletal: Negative.   Neurological: Negative for dizziness and headaches.     Today's Vitals   03/13/18 1039  BP: 100/70  Pulse: 68  Temp: (!) 97.5 F (36.4 C)  TempSrc: Oral  SpO2: 92%  Weight: 175 lb (79.4 kg)  Height: 5' 4" (1.626 m)  PainSc: 0-No pain   Body mass index is 30.04 kg/m.   Objective:  Physical Exam  Constitutional: She is oriented to person, place, and time. She appears well-developed and well-nourished.  Cardiovascular: Normal rate, regular rhythm and normal  heart sounds.  Pulmonary/Chest: Effort normal and breath sounds normal.  Neurological: She is alert and oriented to person, place, and time.  Skin: Skin is warm and dry. Capillary refill takes less than 2 seconds.  Psychiatric: She has a normal mood and affect. Her behavior is normal. Judgment and thought content normal.        Assessment And Plan:     1. Prediabetes  Chronic, controlled with diet and exercise  No current medications - aspirin 81 MG chewable tablet; Chew 1 tablet (81 mg total) by mouth daily.   Dispense: 90 tablet; Refill: 1 - BMP8+eGFR - Hemoglobin A1c  2. Chronic obstructive pulmonary disease, unspecified COPD type ([place])  Chronic, controlled  Continue with current medications - CBC no Diff - tiotropium (SPIRIVA) 18 MCG inhalation capsule; Place 1 capsule (18 mcg total) into inhaler and inhale daily.  Dispense: 30 capsule; Refill: 5 - Fluticasone-Salmeterol (ADVAIR) 250-50 MCG/DOSE AEPB; Inhale 1 puff into the lungs 2 (two) times daily.  Dispense: 60 each; Refill: 5  3. Chronic congestive heart failure, unspecified heart failure type ([facility])  Chronic, controlled  Continue with current medications  No edema noted at this time - furosemide (LASIX) 20 MG tablet; TAKE 1 OR 2 TABLETS BY MOUTH AS NEEDED  Dispense: 90 tablet; Refill: [place], FNP

## 2018-03-13 NOTE — Patient Instructions (Signed)
   Patient refuses a medication for her allergies like Claritin.

## 2018-03-14 DIAGNOSIS — F068 Other specified mental disorders due to known physiological condition: Secondary | ICD-10-CM | POA: Diagnosis not present

## 2018-03-14 LAB — CBC
Hematocrit: 45.7 % (ref 34.0–46.6)
Hemoglobin: 15.3 g/dL (ref 11.1–15.9)
MCH: 32.2 pg (ref 26.6–33.0)
MCHC: 33.5 g/dL (ref 31.5–35.7)
MCV: 96 fL (ref 79–97)
Platelets: 233 10*3/uL (ref 150–450)
RBC: 4.75 x10E6/uL (ref 3.77–5.28)
RDW: 12.8 % (ref 12.3–15.4)
WBC: 8.3 10*3/uL (ref 3.4–10.8)

## 2018-03-14 LAB — BMP8+EGFR
BUN/Creatinine Ratio: 9 — ABNORMAL LOW (ref 12–28)
BUN: 7 mg/dL — ABNORMAL LOW (ref 8–27)
CALCIUM: 9.7 mg/dL (ref 8.7–10.3)
CO2: 26 mmol/L (ref 20–29)
CREATININE: 0.79 mg/dL (ref 0.57–1.00)
Chloride: 103 mmol/L (ref 96–106)
GFR calc Af Amer: 92 mL/min/{1.73_m2} (ref >59)
GFR calc non Af Amer: 80 mL/min/{1.73_m2} (ref >59)
GLUCOSE: 69 mg/dL (ref 65–99)
Potassium: 4.4 mmol/L (ref 3.5–5.2)
SODIUM: 143 mmol/L (ref 134–144)

## 2018-03-14 LAB — HEMOGLOBIN A1C
ESTIMATED AVERAGE GLUCOSE: 117 mg/dL
HEMOGLOBIN A1C: 5.7 % — AB (ref 4.8–5.6)

## 2018-03-15 DIAGNOSIS — F068 Other specified mental disorders due to known physiological condition: Secondary | ICD-10-CM | POA: Diagnosis not present

## 2018-03-16 DIAGNOSIS — F068 Other specified mental disorders due to known physiological condition: Secondary | ICD-10-CM | POA: Diagnosis not present

## 2018-03-17 DIAGNOSIS — F068 Other specified mental disorders due to known physiological condition: Secondary | ICD-10-CM | POA: Diagnosis not present

## 2018-03-18 DIAGNOSIS — F068 Other specified mental disorders due to known physiological condition: Secondary | ICD-10-CM | POA: Diagnosis not present

## 2018-03-22 DIAGNOSIS — F068 Other specified mental disorders due to known physiological condition: Secondary | ICD-10-CM | POA: Diagnosis not present

## 2018-03-23 DIAGNOSIS — F068 Other specified mental disorders due to known physiological condition: Secondary | ICD-10-CM | POA: Diagnosis not present

## 2018-03-24 DIAGNOSIS — F068 Other specified mental disorders due to known physiological condition: Secondary | ICD-10-CM | POA: Diagnosis not present

## 2018-03-25 DIAGNOSIS — F068 Other specified mental disorders due to known physiological condition: Secondary | ICD-10-CM | POA: Diagnosis not present

## 2018-03-26 DIAGNOSIS — F068 Other specified mental disorders due to known physiological condition: Secondary | ICD-10-CM | POA: Diagnosis not present

## 2018-03-27 DIAGNOSIS — F068 Other specified mental disorders due to known physiological condition: Secondary | ICD-10-CM | POA: Diagnosis not present

## 2018-03-28 DIAGNOSIS — F068 Other specified mental disorders due to known physiological condition: Secondary | ICD-10-CM | POA: Diagnosis not present

## 2018-03-29 DIAGNOSIS — F068 Other specified mental disorders due to known physiological condition: Secondary | ICD-10-CM | POA: Diagnosis not present

## 2018-03-30 DIAGNOSIS — F068 Other specified mental disorders due to known physiological condition: Secondary | ICD-10-CM | POA: Diagnosis not present

## 2018-03-31 DIAGNOSIS — F068 Other specified mental disorders due to known physiological condition: Secondary | ICD-10-CM | POA: Diagnosis not present

## 2018-04-01 DIAGNOSIS — F068 Other specified mental disorders due to known physiological condition: Secondary | ICD-10-CM | POA: Diagnosis not present

## 2018-04-02 DIAGNOSIS — F068 Other specified mental disorders due to known physiological condition: Secondary | ICD-10-CM | POA: Diagnosis not present

## 2018-04-03 ENCOUNTER — Other Ambulatory Visit: Payer: Self-pay

## 2018-04-03 ENCOUNTER — Emergency Department (HOSPITAL_COMMUNITY): Payer: Medicare HMO

## 2018-04-03 ENCOUNTER — Encounter (HOSPITAL_COMMUNITY): Payer: Self-pay | Admitting: *Deleted

## 2018-04-03 ENCOUNTER — Emergency Department (HOSPITAL_COMMUNITY)
Admission: EM | Admit: 2018-04-03 | Discharge: 2018-04-03 | Disposition: A | Discharge status: Home / Self Care | Payer: Medicare HMO | Attending: Emergency Medicine | Admitting: Emergency Medicine

## 2018-04-03 DIAGNOSIS — J069 Acute upper respiratory infection, unspecified: Secondary | ICD-10-CM | POA: Diagnosis not present

## 2018-04-03 DIAGNOSIS — I509 Heart failure, unspecified: Secondary | ICD-10-CM | POA: Diagnosis not present

## 2018-04-03 DIAGNOSIS — Z79899 Other long term (current) drug therapy: Secondary | ICD-10-CM | POA: Insufficient documentation

## 2018-04-03 DIAGNOSIS — F068 Other specified mental disorders due to known physiological condition: Secondary | ICD-10-CM | POA: Diagnosis not present

## 2018-04-03 DIAGNOSIS — I25119 Atherosclerotic heart disease of native coronary artery with unspecified angina pectoris: Secondary | ICD-10-CM | POA: Diagnosis not present

## 2018-04-03 DIAGNOSIS — I110 Hypertensive heart disease with heart failure: Secondary | ICD-10-CM | POA: Diagnosis not present

## 2018-04-03 DIAGNOSIS — E119 Type 2 diabetes mellitus without complications: Secondary | ICD-10-CM | POA: Insufficient documentation

## 2018-04-03 DIAGNOSIS — J988 Other specified respiratory disorders: Secondary | ICD-10-CM | POA: Diagnosis not present

## 2018-04-03 DIAGNOSIS — R05 Cough: Secondary | ICD-10-CM | POA: Diagnosis not present

## 2018-04-03 DIAGNOSIS — Z7982 Long term (current) use of aspirin: Secondary | ICD-10-CM | POA: Insufficient documentation

## 2018-04-03 DIAGNOSIS — J441 Chronic obstructive pulmonary disease with (acute) exacerbation: Secondary | ICD-10-CM | POA: Diagnosis not present

## 2018-04-03 MED ORDER — AZITHROMYCIN 250 MG PO TABS: 250.0000 mg | ORAL_TABLET | Freq: Every day | ORAL | 0 refills | Status: DC

## 2018-04-03 MED ORDER — IPRATROPIUM-ALBUTEROL 0.5-2.5 (3) MG/3ML IN SOLN
3.0000 mL | Freq: Once | RESPIRATORY_TRACT | Status: AC
  Administered 2018-04-03: 3 mL via RESPIRATORY_TRACT
  Filled 2018-04-03: qty 3

## 2018-04-03 MED ORDER — BENZONATATE 100 MG PO CAPS: 100.0000 mg | ORAL_CAPSULE | Freq: Three times a day (TID) | ORAL | 0 refills | Status: DC

## 2018-04-03 NOTE — Discharge Instructions (Addendum)
Please read attached information. If you experience any new or worsening signs or symptoms please return to the emergency room for evaluation. Please follow-up with your primary care provider or specialist as discussed.  °

## 2018-04-03 NOTE — ED Triage Notes (Signed)
Pt reports a productive  For several days.

## 2018-04-03 NOTE — ED Provider Notes (Signed)
[facility] EMERGENCY DEPARTMENT Provider Note   CSN: [id number] Arrival date & time: 04/03/18  1223   History   Chief Complaint Chief Complaint  Patient presents with  . Cough    HPI ILEA Kim [name] is a 62 y.o. female.  HPI   62 year old female presents today with complaints of cough.  Patient notes symptoms started 2 days ago with rhinorrhea, nasal congestion and productive cough.  Patient has very minimal associated shortness of breath, she denies any fever, chest pain, tightness of breath, or any lower extremity swelling or edema.  She denies any recent antibiotic exposures or hospitalizations.  She does note a history of COPD using Spiriva, Advair and albuterol at home.  She continues to smoke.   Past Medical History:  Diagnosis Date  . CHF (congestive heart failure) ([place])   . COPD (chronic obstructive pulmonary disease) ([place])   . Diabetes mellitus without complication ([place])   . GERD (gastroesophageal reflux disease)   . History of DVT (deep vein thrombosis)   . Hypercholesteremia   . Hyperlipemia   . Normal coronary arteries    by cardiac catheterization which I performed September 2015  . Schizophrenia ([place])   . Tobacco abuse     Patient Active Problem List   Diagnosis Date Noted  . COPD with emphysema ([place]) 04/07/2014  . Acute on chronic diastolic heart failure ([place]) 03/18/2014  . DVT, popliteal, acute ([place]) 03/18/2014  . Tobacco abuse 03/18/2014  . Coronary artery disease, non-occlusive 02/10/2014  . Angina, class III - related to Acute Diastolic HF & COPD Exacerbation; Non-obstructive CAD 02/09/2014  . Hyperlipidemia 08/28/2008    Past Surgical History:  Procedure Laterality Date  . LEFT HEART CATHETERIZATION WITH CORONARY ANGIOGRAM N/A 02/09/2014   Procedure: LEFT HEART CATHETERIZATION WITH CORONARY ANGIOGRAM;  Surgeon: Lorretta Harp, MD;  Location: [facility] CATH LAB;  Service: Cardiovascular;  Laterality: N/A;  . none       OB History    None      Home Medications    Prior to Admission medications   Medication Sig Start Date End Date Taking? Authorizing Provider  albuterol (VENTOLIN HFA) 108 (90 Base) MCG/ACT inhaler Inhale 2 puffs into the lungs every 4 (four) hours as needed for wheezing or shortness of breath. 10/23/17   Rigoberto Noel, MD  aspirin 81 MG chewable tablet Chew 1 tablet (81 mg total) by mouth daily. 03/13/18   [name], FNP  azithromycin (ZITHROMAX) 250 MG tablet Take 1 tablet (250 mg total) by mouth daily. Take first 2 tablets together, then 1 every day until finished. 04/03/18   Armando Bukhari, Dellis Filbert, PA-C  benzonatate (TESSALON) 100 MG capsule Take 1 capsule (100 mg total) by mouth every 8 (eight) hours. 04/03/18   Sheri Gatchel, Dellis Filbert, PA-C  cholecalciferol (VITAMIN D) 1000 units tablet Take 1,000 Units by mouth daily.    [provider]  Fluticasone-Salmeterol (ADVAIR) 250-50 MCG/DOSE AEPB Inhale 1 puff into the lungs 2 (two) times daily. 03/13/18   [name], FNP  furosemide (LASIX) 20 MG tablet TAKE 1 OR 2 TABLETS BY MOUTH AS NEEDED 03/13/18   [name], FNP  risperiDONE (RISPERDAL) 2 MG tablet Take 2 mg by mouth at bedtime.    [provider]  tiotropium (SPIRIVA) 18 MCG inhalation capsule Place 1 capsule (18 mcg total) into inhaler and inhale daily. 03/13/18   [name], FNP    Family History History reviewed. No pertinent family history.  Social History Social History  Tobacco Use  . Smoking status: Current Every Day Smoker    Packs/day: 1.00    Years: 21.00    Pack years: 21.00    Types: Cigarettes  . Smokeless tobacco: Never Used  . Tobacco comment: smoking 1/2-1ppd   Substance Use Topics  . Alcohol use: No  . Drug use: No     Allergies   Asa [aspirin]; Metformin and related; Penicillins; Quetiapine; and Acetaminophen   Review of Systems Review of Systems  All other systems reviewed and are negative.    Physical Exam Updated Vital Signs BP (!)  147/92 (BP Location: Right Arm)   Pulse 70   Temp 98.2 F (36.8 C) (Oral)   Resp 18   Ht 5\' 4"  (1.626 m)   Wt 80.7 kg   SpO2 98%   BMI 30.55 kg/m   Physical Exam  Constitutional: She is oriented to person, place, and time. She appears well-developed and well-nourished.  HENT:  Head: Normocephalic and atraumatic.  Rhinorrhea  Eyes: Pupils are equal, round, and reactive to light. Conjunctivae are normal. Right eye exhibits no discharge. Left eye exhibits no discharge. No scleral icterus.  Neck: Normal range of motion. No JVD present. No tracheal deviation present.  Pulmonary/Chest: Effort normal. No stridor.  Minor bilateral expiratory wheeze, no crackles  Musculoskeletal: She exhibits no edema.  Neurological: She is alert and oriented to person, place, and time. Coordination normal.  Psychiatric: She has a normal mood and affect. Her behavior is normal. Judgment and thought content normal.  Nursing note and vitals reviewed.    ED Treatments / Results  Labs (all labs ordered are listed, but only abnormal results are displayed) Labs Reviewed - No data to display  EKG None  Radiology Dg Chest 2 View  Result Date: 04/03/2018 CLINICAL DATA:  Dry cough for a few days. EXAM: CHEST - 2 VIEW COMPARISON:  06/24/2016 FINDINGS: Cardiac silhouette is mildly enlarged. No mediastinal or hilar masses. There is no evidence of adenopathy. There are prominent bronchovascular markings. Few minor reticular opacities noted at the bases consistent with scarring. No evidence of pneumonia or pulmonary edema. No pleural effusion or pneumothorax. Skeletal structures are intact. IMPRESSION: 1. No acute cardiopulmonary disease. 2. Mild cardiomegaly. Electronically Signed   By: Lajean Manes M.D.   On: 04/03/2018 13:34    Procedures Procedures (including critical care time)  Medications Ordered in ED Medications  ipratropium-albuterol (DUONEB) 0.5-2.5 (3) MG/3ML nebulizer solution 3 mL (3 mLs  Nebulization Given 04/03/18 1244)     Initial Impression / Assessment and Plan / ED Course  I have reviewed the triage vital signs and the nursing notes.  Pertinent labs & imaging results that were available during my care of the patient were reviewed by me and considered in my medical decision making (see chart for details).     62 year old female presents today with likely upper respiratory infection and COPD exacerbation.  Patient does have increased sputum and minor increased shortness of breath.  Given her comorbidities antibiotics will be initiated, patient will follow-up as an outpatient for repeat assessment she will return immediately if she develops any new or worsening signs or symptoms.  She verbalized understanding and agreement to today's plan had no further questions or concerns.  Final Clinical Impressions(s) / ED Diagnoses   Final diagnoses:  COPD exacerbation ([place])  Respiratory infection    ED Discharge Orders         Ordered    azithromycin (ZITHROMAX) 250 MG tablet  Daily  04/03/18 1405    benzonatate (TESSALON) 100 MG capsule  Every 8 hours,   Status:  Discontinued     04/03/18 1408    benzonatate (TESSALON) 100 MG capsule  Every 8 hours     04/03/18 1409           Okey Regal, PA-C 04/05/18 5894    Blanchie Dessert, MD 04/09/18 1600

## 2018-04-04 DIAGNOSIS — F209 Schizophrenia, unspecified: Secondary | ICD-10-CM | POA: Diagnosis not present

## 2018-04-04 DIAGNOSIS — F068 Other specified mental disorders due to known physiological condition: Secondary | ICD-10-CM | POA: Diagnosis not present

## 2018-04-05 DIAGNOSIS — F068 Other specified mental disorders due to known physiological condition: Secondary | ICD-10-CM | POA: Diagnosis not present

## 2018-04-06 DIAGNOSIS — F068 Other specified mental disorders due to known physiological condition: Secondary | ICD-10-CM | POA: Diagnosis not present

## 2018-04-07 DIAGNOSIS — F068 Other specified mental disorders due to known physiological condition: Secondary | ICD-10-CM | POA: Diagnosis not present

## 2018-04-08 DIAGNOSIS — F068 Other specified mental disorders due to known physiological condition: Secondary | ICD-10-CM | POA: Diagnosis not present

## 2018-04-09 DIAGNOSIS — F068 Other specified mental disorders due to known physiological condition: Secondary | ICD-10-CM | POA: Diagnosis not present

## 2018-04-10 DIAGNOSIS — F068 Other specified mental disorders due to known physiological condition: Secondary | ICD-10-CM | POA: Diagnosis not present

## 2018-04-11 DIAGNOSIS — F068 Other specified mental disorders due to known physiological condition: Secondary | ICD-10-CM | POA: Diagnosis not present

## 2018-04-12 DIAGNOSIS — F068 Other specified mental disorders due to known physiological condition: Secondary | ICD-10-CM | POA: Diagnosis not present

## 2018-04-13 DIAGNOSIS — F068 Other specified mental disorders due to known physiological condition: Secondary | ICD-10-CM | POA: Diagnosis not present

## 2018-04-14 DIAGNOSIS — F068 Other specified mental disorders due to known physiological condition: Secondary | ICD-10-CM | POA: Diagnosis not present

## 2018-04-15 DIAGNOSIS — F068 Other specified mental disorders due to known physiological condition: Secondary | ICD-10-CM | POA: Diagnosis not present

## 2018-04-16 DIAGNOSIS — F068 Other specified mental disorders due to known physiological condition: Secondary | ICD-10-CM | POA: Diagnosis not present

## 2018-04-17 DIAGNOSIS — F068 Other specified mental disorders due to known physiological condition: Secondary | ICD-10-CM | POA: Diagnosis not present

## 2018-04-18 DIAGNOSIS — F068 Other specified mental disorders due to known physiological condition: Secondary | ICD-10-CM | POA: Diagnosis not present

## 2018-04-21 DIAGNOSIS — F068 Other specified mental disorders due to known physiological condition: Secondary | ICD-10-CM | POA: Diagnosis not present

## 2018-04-22 DIAGNOSIS — F068 Other specified mental disorders due to known physiological condition: Secondary | ICD-10-CM | POA: Diagnosis not present

## 2018-04-23 DIAGNOSIS — F068 Other specified mental disorders due to known physiological condition: Secondary | ICD-10-CM | POA: Diagnosis not present

## 2018-04-24 DIAGNOSIS — F068 Other specified mental disorders due to known physiological condition: Secondary | ICD-10-CM | POA: Diagnosis not present

## 2018-04-25 DIAGNOSIS — F068 Other specified mental disorders due to known physiological condition: Secondary | ICD-10-CM | POA: Diagnosis not present

## 2018-04-26 DIAGNOSIS — F068 Other specified mental disorders due to known physiological condition: Secondary | ICD-10-CM | POA: Diagnosis not present

## 2018-05-03 ENCOUNTER — Ambulatory Visit: Payer: Self-pay | Admitting: Nurse Practitioner

## 2018-05-04 DIAGNOSIS — F068 Other specified mental disorders due to known physiological condition: Secondary | ICD-10-CM | POA: Diagnosis not present

## 2018-05-05 DIAGNOSIS — F068 Other specified mental disorders due to known physiological condition: Secondary | ICD-10-CM | POA: Diagnosis not present

## 2018-05-06 DIAGNOSIS — F068 Other specified mental disorders due to known physiological condition: Secondary | ICD-10-CM | POA: Diagnosis not present

## 2018-05-07 ENCOUNTER — Ambulatory Visit: Payer: Self-pay | Admitting: Internal Medicine

## 2018-05-07 DIAGNOSIS — F068 Other specified mental disorders due to known physiological condition: Secondary | ICD-10-CM | POA: Diagnosis not present

## 2018-05-08 DIAGNOSIS — F068 Other specified mental disorders due to known physiological condition: Secondary | ICD-10-CM | POA: Diagnosis not present

## 2018-05-09 DIAGNOSIS — F068 Other specified mental disorders due to known physiological condition: Secondary | ICD-10-CM | POA: Diagnosis not present

## 2018-05-10 DIAGNOSIS — F068 Other specified mental disorders due to known physiological condition: Secondary | ICD-10-CM | POA: Diagnosis not present

## 2018-05-11 DIAGNOSIS — F068 Other specified mental disorders due to known physiological condition: Secondary | ICD-10-CM | POA: Diagnosis not present

## 2018-05-12 DIAGNOSIS — F068 Other specified mental disorders due to known physiological condition: Secondary | ICD-10-CM | POA: Diagnosis not present

## 2018-05-13 DIAGNOSIS — F068 Other specified mental disorders due to known physiological condition: Secondary | ICD-10-CM | POA: Diagnosis not present

## 2018-05-14 DIAGNOSIS — F068 Other specified mental disorders due to known physiological condition: Secondary | ICD-10-CM | POA: Diagnosis not present

## 2018-05-15 DIAGNOSIS — F068 Other specified mental disorders due to known physiological condition: Secondary | ICD-10-CM | POA: Diagnosis not present

## 2018-05-16 DIAGNOSIS — F068 Other specified mental disorders due to known physiological condition: Secondary | ICD-10-CM | POA: Diagnosis not present

## 2018-05-17 DIAGNOSIS — F068 Other specified mental disorders due to known physiological condition: Secondary | ICD-10-CM | POA: Diagnosis not present

## 2018-05-18 DIAGNOSIS — F068 Other specified mental disorders due to known physiological condition: Secondary | ICD-10-CM | POA: Diagnosis not present

## 2018-05-19 DIAGNOSIS — F068 Other specified mental disorders due to known physiological condition: Secondary | ICD-10-CM | POA: Diagnosis not present

## 2018-05-22 DIAGNOSIS — F068 Other specified mental disorders due to known physiological condition: Secondary | ICD-10-CM | POA: Diagnosis not present

## 2018-05-23 ENCOUNTER — Ambulatory Visit: Payer: Medicare HMO | Admitting: Internal Medicine

## 2018-05-23 DIAGNOSIS — F068 Other specified mental disorders due to known physiological condition: Secondary | ICD-10-CM | POA: Diagnosis not present

## 2018-05-24 DIAGNOSIS — F068 Other specified mental disorders due to known physiological condition: Secondary | ICD-10-CM | POA: Diagnosis not present

## 2018-05-25 DIAGNOSIS — F068 Other specified mental disorders due to known physiological condition: Secondary | ICD-10-CM | POA: Diagnosis not present

## 2018-05-26 DIAGNOSIS — F068 Other specified mental disorders due to known physiological condition: Secondary | ICD-10-CM | POA: Diagnosis not present

## 2018-05-27 DIAGNOSIS — F068 Other specified mental disorders due to known physiological condition: Secondary | ICD-10-CM | POA: Diagnosis not present

## 2018-05-28 DIAGNOSIS — F068 Other specified mental disorders due to known physiological condition: Secondary | ICD-10-CM | POA: Diagnosis not present

## 2018-05-29 DIAGNOSIS — F068 Other specified mental disorders due to known physiological condition: Secondary | ICD-10-CM | POA: Diagnosis not present

## 2018-05-30 DIAGNOSIS — F068 Other specified mental disorders due to known physiological condition: Secondary | ICD-10-CM | POA: Diagnosis not present

## 2018-05-31 DIAGNOSIS — F068 Other specified mental disorders due to known physiological condition: Secondary | ICD-10-CM | POA: Diagnosis not present

## 2018-06-01 DIAGNOSIS — F068 Other specified mental disorders due to known physiological condition: Secondary | ICD-10-CM | POA: Diagnosis not present

## 2018-06-02 DIAGNOSIS — F068 Other specified mental disorders due to known physiological condition: Secondary | ICD-10-CM | POA: Diagnosis not present

## 2018-06-03 DIAGNOSIS — F068 Other specified mental disorders due to known physiological condition: Secondary | ICD-10-CM | POA: Diagnosis not present

## 2018-06-04 ENCOUNTER — Telehealth: Payer: Self-pay | Admitting: Nurse Practitioner

## 2018-06-04 ENCOUNTER — Inpatient Hospital Stay (HOSPITAL_COMMUNITY): Payer: Medicare HMO

## 2018-06-04 ENCOUNTER — Inpatient Hospital Stay (HOSPITAL_COMMUNITY)
Admission: EM | Admit: 2018-06-04 | Discharge: 2018-06-12 | DRG: 208 | Disposition: A | Discharge status: Skilled Nursing Facility | Payer: Medicare HMO | Attending: Internal Medicine | Admitting: Internal Medicine

## 2018-06-04 ENCOUNTER — Emergency Department (HOSPITAL_COMMUNITY): Payer: Medicare HMO

## 2018-06-04 DIAGNOSIS — J9602 Acute respiratory failure with hypercapnia: Secondary | ICD-10-CM | POA: Diagnosis not present

## 2018-06-04 DIAGNOSIS — J9811 Atelectasis: Secondary | ICD-10-CM | POA: Diagnosis not present

## 2018-06-04 DIAGNOSIS — K219 Gastro-esophageal reflux disease without esophagitis: Secondary | ICD-10-CM | POA: Diagnosis present

## 2018-06-04 DIAGNOSIS — Z886 Allergy status to analgesic agent status: Secondary | ICD-10-CM

## 2018-06-04 DIAGNOSIS — Z888 Allergy status to other drugs, medicaments and biological substances status: Secondary | ICD-10-CM

## 2018-06-04 DIAGNOSIS — Z86718 Personal history of other venous thrombosis and embolism: Secondary | ICD-10-CM | POA: Diagnosis not present

## 2018-06-04 DIAGNOSIS — I700 Atherosclerosis of aorta: Secondary | ICD-10-CM | POA: Diagnosis not present

## 2018-06-04 DIAGNOSIS — E7800 Pure hypercholesterolemia, unspecified: Secondary | ICD-10-CM | POA: Diagnosis present

## 2018-06-04 DIAGNOSIS — E1165 Type 2 diabetes mellitus with hyperglycemia: Secondary | ICD-10-CM | POA: Diagnosis present

## 2018-06-04 DIAGNOSIS — I10 Essential (primary) hypertension: Secondary | ICD-10-CM | POA: Diagnosis not present

## 2018-06-04 DIAGNOSIS — F17210 Nicotine dependence, cigarettes, uncomplicated: Secondary | ICD-10-CM | POA: Diagnosis present

## 2018-06-04 DIAGNOSIS — J441 Chronic obstructive pulmonary disease with (acute) exacerbation: Secondary | ICD-10-CM | POA: Diagnosis not present

## 2018-06-04 DIAGNOSIS — Z978 Presence of other specified devices: Secondary | ICD-10-CM

## 2018-06-04 DIAGNOSIS — J440 Chronic obstructive pulmonary disease with acute lower respiratory infection: Secondary | ICD-10-CM | POA: Diagnosis present

## 2018-06-04 DIAGNOSIS — Z23 Encounter for immunization: Secondary | ICD-10-CM

## 2018-06-04 DIAGNOSIS — J9601 Acute respiratory failure with hypoxia: Secondary | ICD-10-CM | POA: Diagnosis present

## 2018-06-04 DIAGNOSIS — G931 Anoxic brain damage, not elsewhere classified: Secondary | ICD-10-CM | POA: Diagnosis present

## 2018-06-04 DIAGNOSIS — F209 Schizophrenia, unspecified: Secondary | ICD-10-CM | POA: Diagnosis present

## 2018-06-04 DIAGNOSIS — J439 Emphysema, unspecified: Secondary | ICD-10-CM | POA: Diagnosis not present

## 2018-06-04 DIAGNOSIS — Z7982 Long term (current) use of aspirin: Secondary | ICD-10-CM

## 2018-06-04 DIAGNOSIS — I361 Nonrheumatic tricuspid (valve) insufficiency: Secondary | ICD-10-CM | POA: Diagnosis not present

## 2018-06-04 DIAGNOSIS — I110 Hypertensive heart disease with heart failure: Secondary | ICD-10-CM | POA: Diagnosis present

## 2018-06-04 DIAGNOSIS — J181 Lobar pneumonia, unspecified organism: Secondary | ICD-10-CM | POA: Diagnosis not present

## 2018-06-04 DIAGNOSIS — I5032 Chronic diastolic (congestive) heart failure: Secondary | ICD-10-CM | POA: Diagnosis present

## 2018-06-04 DIAGNOSIS — Z880 Allergy status to penicillin: Secondary | ICD-10-CM

## 2018-06-04 DIAGNOSIS — I469 Cardiac arrest, cause unspecified: Secondary | ICD-10-CM

## 2018-06-04 DIAGNOSIS — E871 Hypo-osmolality and hyponatremia: Secondary | ICD-10-CM | POA: Diagnosis not present

## 2018-06-04 DIAGNOSIS — E874 Mixed disorder of acid-base balance: Secondary | ICD-10-CM | POA: Diagnosis not present

## 2018-06-04 DIAGNOSIS — F068 Other specified mental disorders due to known physiological condition: Secondary | ICD-10-CM | POA: Diagnosis not present

## 2018-06-04 DIAGNOSIS — I2510 Atherosclerotic heart disease of native coronary artery without angina pectoris: Secondary | ICD-10-CM | POA: Diagnosis present

## 2018-06-04 DIAGNOSIS — G4733 Obstructive sleep apnea (adult) (pediatric): Secondary | ICD-10-CM | POA: Diagnosis not present

## 2018-06-04 DIAGNOSIS — I468 Cardiac arrest due to other underlying condition: Secondary | ICD-10-CM | POA: Diagnosis not present

## 2018-06-04 DIAGNOSIS — Z452 Encounter for adjustment and management of vascular access device: Secondary | ICD-10-CM | POA: Diagnosis not present

## 2018-06-04 DIAGNOSIS — J14 Pneumonia due to Hemophilus influenzae: Secondary | ICD-10-CM | POA: Diagnosis not present

## 2018-06-04 DIAGNOSIS — E785 Hyperlipidemia, unspecified: Secondary | ICD-10-CM | POA: Diagnosis present

## 2018-06-04 DIAGNOSIS — Z9289 Personal history of other medical treatment: Secondary | ICD-10-CM

## 2018-06-04 DIAGNOSIS — R000 Tachycardia, unspecified: Secondary | ICD-10-CM | POA: Diagnosis not present

## 2018-06-04 DIAGNOSIS — R0602 Shortness of breath: Secondary | ICD-10-CM | POA: Diagnosis not present

## 2018-06-04 DIAGNOSIS — G9340 Encephalopathy, unspecified: Secondary | ICD-10-CM | POA: Diagnosis not present

## 2018-06-04 DIAGNOSIS — J449 Chronic obstructive pulmonary disease, unspecified: Secondary | ICD-10-CM | POA: Diagnosis not present

## 2018-06-04 DIAGNOSIS — Z4682 Encounter for fitting and adjustment of non-vascular catheter: Secondary | ICD-10-CM | POA: Diagnosis not present

## 2018-06-04 DIAGNOSIS — J810 Acute pulmonary edema: Secondary | ICD-10-CM | POA: Diagnosis not present

## 2018-06-04 DIAGNOSIS — R569 Unspecified convulsions: Secondary | ICD-10-CM | POA: Diagnosis not present

## 2018-06-04 DIAGNOSIS — S199XXA Unspecified injury of neck, initial encounter: Secondary | ICD-10-CM | POA: Diagnosis not present

## 2018-06-04 DIAGNOSIS — I509 Heart failure, unspecified: Secondary | ICD-10-CM | POA: Diagnosis not present

## 2018-06-04 DIAGNOSIS — R404 Transient alteration of awareness: Secondary | ICD-10-CM | POA: Diagnosis not present

## 2018-06-04 DIAGNOSIS — S0990XA Unspecified injury of head, initial encounter: Secondary | ICD-10-CM | POA: Diagnosis not present

## 2018-06-04 DIAGNOSIS — R0689 Other abnormalities of breathing: Secondary | ICD-10-CM | POA: Diagnosis not present

## 2018-06-04 DIAGNOSIS — R32 Unspecified urinary incontinence: Secondary | ICD-10-CM | POA: Diagnosis not present

## 2018-06-04 LAB — BASIC METABOLIC PANEL
Anion gap: 18 — ABNORMAL HIGH (ref 5–15)
BUN: 17 mg/dL (ref 8–23)
CHLORIDE: 79 mmol/L — AB (ref 98–111)
CO2: 20 mmol/L — ABNORMAL LOW (ref 22–32)
CREATININE: 1.55 mg/dL — AB (ref 0.44–1.00)
Calcium: 9.2 mg/dL (ref 8.9–10.3)
GFR calc non Af Amer: 35 mL/min — ABNORMAL LOW (ref >60)
GFR, EST AFRICAN AMERICAN: 41 mL/min — AB (ref >60)
Glucose, Bld: 228 mg/dL — ABNORMAL HIGH (ref 70–99)
Potassium: 4.4 mmol/L (ref 3.5–5.1)
Sodium: 117 mmol/L — CL (ref 135–145)

## 2018-06-04 LAB — RESPIRATORY PANEL BY PCR
ADENOVIRUS-RVPPCR: NOT DETECTED
Bordetella pertussis: NOT DETECTED
CHLAMYDOPHILA PNEUMONIAE-RVPPCR: NOT DETECTED
Coronavirus 229E: NOT DETECTED
Coronavirus HKU1: NOT DETECTED
Coronavirus NL63: NOT DETECTED
Coronavirus OC43: NOT DETECTED
Influenza A: NOT DETECTED
Influenza B: NOT DETECTED
Metapneumovirus: NOT DETECTED
Mycoplasma pneumoniae: NOT DETECTED
Parainfluenza Virus 1: NOT DETECTED
Parainfluenza Virus 2: NOT DETECTED
Parainfluenza Virus 3: NOT DETECTED
Parainfluenza Virus 4: NOT DETECTED
RHINOVIRUS / ENTEROVIRUS - RVPPCR: DETECTED — AB
Respiratory Syncytial Virus: NOT DETECTED

## 2018-06-04 LAB — I-STAT ARTERIAL BLOOD GAS, ED
Acid-Base Excess: 2 mmol/L (ref 0.0–2.0)
Bicarbonate: 32.4 mmol/L — ABNORMAL HIGH (ref 20.0–28.0)
Bicarbonate: 30.8 mmol/L — ABNORMAL HIGH (ref 20.0–28.0)
O2 Saturation: 100 %
O2 Saturation: 93 %
Patient temperature: 97.9
Patient temperature: 97.7
TCO2: 35 mmol/L — AB (ref 22–32)
TCO2: 33 mmol/L — ABNORMAL HIGH (ref 22–32)
pCO2 arterial: 87.2 mmHg (ref 32.0–48.0)
pCO2 arterial: 60.5 mmHg — ABNORMAL HIGH (ref 32.0–48.0)
pH, Arterial: 7.176 — CL (ref 7.350–7.450)
pH, Arterial: 7.313 — ABNORMAL LOW (ref 7.350–7.450)
pO2, Arterial: 316 mmHg — ABNORMAL HIGH (ref 83.0–108.0)
pO2, Arterial: 72 mmHg — ABNORMAL LOW (ref 83.0–108.0)

## 2018-06-04 LAB — CBG MONITORING, ED: Glucose-Capillary: 151 mg/dL — ABNORMAL HIGH (ref 70–99)

## 2018-06-04 LAB — COMPREHENSIVE METABOLIC PANEL
ALT: 28 U/L (ref 0–44)
AST: 40 U/L (ref 15–41)
Albumin: 2.2 g/dL — ABNORMAL LOW (ref 3.5–5.0)
Alkaline Phosphatase: 84 U/L (ref 38–126)
Anion gap: 11 (ref 5–15)
BUN: 16 mg/dL (ref 8–23)
CO2: 24 mmol/L (ref 22–32)
Calcium: 7.8 mg/dL — ABNORMAL LOW (ref 8.9–10.3)
Chloride: 87 mmol/L — ABNORMAL LOW (ref 98–111)
Creatinine, Ser: 0.8 mg/dL (ref 0.44–1.00)
GFR calc Af Amer: >60 mL/min (ref >60)
GFR calc non Af Amer: >60 mL/min (ref >60)
Glucose, Bld: 129 mg/dL — ABNORMAL HIGH (ref 70–99)
POTASSIUM: 4.4 mmol/L (ref 3.5–5.1)
Sodium: 122 mmol/L — ABNORMAL LOW (ref 135–145)
Total Bilirubin: 1.3 mg/dL — ABNORMAL HIGH (ref 0.3–1.2)
Total Protein: 4.8 g/dL — ABNORMAL LOW (ref 6.5–8.1)

## 2018-06-04 LAB — CBC
HCT: 53.8 % — ABNORMAL HIGH (ref 36.0–46.0)
HCT: 44.8 % (ref 36.0–46.0)
Hemoglobin: 17.2 g/dL — ABNORMAL HIGH (ref 12.0–15.0)
Hemoglobin: 15.5 g/dL — ABNORMAL HIGH (ref 12.0–15.0)
MCH: 31.2 pg (ref 26.0–34.0)
MCH: 32.4 pg (ref 26.0–34.0)
MCHC: 32 g/dL (ref 30.0–36.0)
MCHC: 34.6 g/dL (ref 30.0–36.0)
MCV: 97.5 fL (ref 80.0–100.0)
MCV: 93.5 fL (ref 80.0–100.0)
PLATELETS: 122 10*3/uL — AB (ref 150–400)
Platelets: 238 10*3/uL (ref 150–400)
RBC: 5.52 MIL/uL — ABNORMAL HIGH (ref 3.87–5.11)
RBC: 4.79 MIL/uL (ref 3.87–5.11)
RDW: 11.9 % (ref 11.5–15.5)
RDW: 12 % (ref 11.5–15.5)
WBC: 22.8 10*3/uL — ABNORMAL HIGH (ref 4.0–10.5)
WBC: 24.4 10*3/uL — ABNORMAL HIGH (ref 4.0–10.5)
nRBC: 0.2 % (ref 0.0–0.2)
nRBC: 0.1 % (ref 0.0–0.2)

## 2018-06-04 LAB — RAPID URINE DRUG SCREEN, HOSP PERFORMED
Amphetamines: NOT DETECTED
Barbiturates: NOT DETECTED
Benzodiazepines: NOT DETECTED
Cocaine: NOT DETECTED
Opiates: NOT DETECTED
TETRAHYDROCANNABINOL: NOT DETECTED

## 2018-06-04 LAB — PROTIME-INR
INR: 1.39
INR: 1.7
Prothrombin Time: 16.9 seconds — ABNORMAL HIGH (ref 11.4–15.2)
Prothrombin Time: 19.8 seconds — ABNORMAL HIGH (ref 11.4–15.2)

## 2018-06-04 LAB — ECHOCARDIOGRAM COMPLETE

## 2018-06-04 LAB — GLUCOSE, CAPILLARY
GLUCOSE-CAPILLARY: 133 mg/dL — AB (ref 70–99)
Glucose-Capillary: 81 mg/dL (ref 70–99)
Glucose-Capillary: 103 mg/dL — ABNORMAL HIGH (ref 70–99)

## 2018-06-04 LAB — APTT
APTT: 49 s — AB (ref 24–36)
aPTT: 35 seconds (ref 24–36)

## 2018-06-04 LAB — I-STAT CG4 LACTIC ACID, ED: Lactic Acid, Venous: 8.7 mmol/L (ref 0.5–1.9)

## 2018-06-04 LAB — PHOSPHORUS: Phosphorus: 3.4 mg/dL (ref 2.5–4.6)

## 2018-06-04 LAB — I-STAT TROPONIN, ED: Troponin i, poc: 0.03 ng/mL (ref 0.00–0.08)

## 2018-06-04 LAB — CORTISOL: CORTISOL PLASMA: 23 ug/dL

## 2018-06-04 LAB — LACTIC ACID, PLASMA: Lactic Acid, Venous: 1.5 mmol/L (ref 0.5–1.9)

## 2018-06-04 LAB — MRSA PCR SCREENING: MRSA by PCR: NEGATIVE

## 2018-06-04 LAB — TROPONIN I
Troponin I: 0.11 ng/mL (ref <0.03)
Troponin I: 0.16 ng/mL (ref <0.03)
Troponin I: 0.11 ng/mL (ref <0.03)

## 2018-06-04 LAB — AMYLASE: Amylase: 195 U/L — ABNORMAL HIGH (ref 28–100)

## 2018-06-04 LAB — LIPASE, BLOOD: Lipase: 18 U/L (ref 11–51)

## 2018-06-04 LAB — PROCALCITONIN: PROCALCITONIN: 0.36 ng/mL

## 2018-06-04 LAB — STREP PNEUMONIAE URINARY ANTIGEN: STREP PNEUMO URINARY ANTIGEN: NEGATIVE

## 2018-06-04 LAB — MAGNESIUM: Magnesium: 1.3 mg/dL — ABNORMAL LOW (ref 1.7–2.4)

## 2018-06-04 LAB — ETHANOL: Alcohol, Ethyl (B): <10 mg/dL (ref <10)

## 2018-06-04 MED ORDER — FENTANYL 2500MCG IN NS 250ML (10MCG/ML) PREMIX INFUSION
25.0000 ug/h | INTRAVENOUS | Status: DC
  Administered 2018-06-05: 50 ug/h via INTRAVENOUS
  Filled 2018-06-04: qty 250

## 2018-06-04 MED ORDER — FOLIC ACID 5 MG/ML IJ SOLN
1.0000 mg | Freq: Every day | INTRAMUSCULAR | Status: DC
  Administered 2018-06-04 – 2018-06-09 (×6): 1 mg via INTRAVENOUS
  Filled 2018-06-04 (×7): qty 0.2

## 2018-06-04 MED ORDER — IOPAMIDOL (ISOVUE-370) INJECTION 76%
INTRAVENOUS | Status: AC
  Administered 2018-06-04: 60 mL
  Filled 2018-06-04: qty 100

## 2018-06-04 MED ORDER — SODIUM CHLORIDE 0.9 % IV SOLN
250.0000 mL | INTRAVENOUS | Status: DC
  Administered 2018-06-06 – 2018-06-08 (×2): 250 mL via INTRAVENOUS

## 2018-06-04 MED ORDER — NOREPINEPHRINE-SODIUM CHLORIDE 4-0.9 MG/250ML-% IV SOLN
0.0000 ug/min | INTRAVENOUS | Status: DC
  Administered 2018-06-04: 10 ug/min via INTRAVENOUS
  Filled 2018-06-04: qty 250

## 2018-06-04 MED ORDER — NOREPINEPHRINE-SODIUM CHLORIDE 4-0.9 MG/250ML-% IV SOLN
2.0000 ug/min | INTRAVENOUS | Status: DC
  Filled 2018-06-04: qty 250

## 2018-06-04 MED ORDER — IPRATROPIUM-ALBUTEROL 0.5-2.5 (3) MG/3ML IN SOLN
3.0000 mL | Freq: Four times a day (QID) | RESPIRATORY_TRACT | Status: DC
  Administered 2018-06-04 – 2018-06-09 (×20): 3 mL via RESPIRATORY_TRACT
  Filled 2018-06-04 (×20): qty 3

## 2018-06-04 MED ORDER — THIAMINE HCL 100 MG/ML IJ SOLN
100.0000 mg | Freq: Every day | INTRAMUSCULAR | Status: DC
  Administered 2018-06-04 – 2018-06-09 (×6): 100 mg via INTRAVENOUS
  Filled 2018-06-04 (×6): qty 2

## 2018-06-04 MED ORDER — SENNOSIDES 8.8 MG/5ML PO SYRP
5.0000 mL | ORAL_SOLUTION | Freq: Two times a day (BID) | ORAL | Status: DC | PRN
  Filled 2018-06-04: qty 5

## 2018-06-04 MED ORDER — ETOMIDATE 2 MG/ML IV SOLN
INTRAVENOUS | Status: AC | PRN
  Administered 2018-06-04: 20 mg via INTRAVENOUS

## 2018-06-04 MED ORDER — PANTOPRAZOLE SODIUM 40 MG IV SOLR
40.0000 mg | Freq: Every day | INTRAVENOUS | Status: DC
  Administered 2018-06-04 – 2018-06-08 (×5): 40 mg via INTRAVENOUS
  Filled 2018-06-04 (×5): qty 40

## 2018-06-04 MED ORDER — MIDAZOLAM HCL 2 MG/2ML IJ SOLN
2.0000 mg | INTRAMUSCULAR | Status: DC | PRN
  Administered 2018-06-07: 2 mg via INTRAVENOUS
  Filled 2018-06-04: qty 2

## 2018-06-04 MED ORDER — SODIUM CHLORIDE 0.9 % IV SOLN
1.0000 g | Freq: Two times a day (BID) | INTRAVENOUS | Status: DC
  Administered 2018-06-04: 1 g via INTRAVENOUS
  Filled 2018-06-04 (×2): qty 1

## 2018-06-04 MED ORDER — SODIUM CHLORIDE 0.9 % IV SOLN
INTRAVENOUS | Status: DC
  Administered 2018-06-04 (×2): via INTRAVENOUS

## 2018-06-04 MED ORDER — MIDAZOLAM HCL 2 MG/2ML IJ SOLN: 2.0000 mg | INTRAMUSCULAR | Status: DC | PRN

## 2018-06-04 MED ORDER — FENTANYL CITRATE (PF) 100 MCG/2ML IJ SOLN
100.0000 ug | Freq: Once | INTRAMUSCULAR | Status: AC
  Administered 2018-06-04: 100 ug via INTRAVENOUS

## 2018-06-04 MED ORDER — FENTANYL BOLUS VIA INFUSION
50.0000 ug | INTRAVENOUS | Status: DC | PRN
  Administered 2018-06-06: 50 ug via INTRAVENOUS
  Filled 2018-06-04: qty 50

## 2018-06-04 MED ORDER — FENTANYL 2500MCG IN NS 250ML (10MCG/ML) PREMIX INFUSION
0.0000 ug/h | INTRAVENOUS | Status: DC
  Administered 2018-06-04: 25 ug/h via INTRAVENOUS
  Filled 2018-06-04: qty 250

## 2018-06-04 MED ORDER — VANCOMYCIN HCL 10 G IV SOLR
1250.0000 mg | INTRAVENOUS | Status: DC
  Filled 2018-06-04: qty 1250

## 2018-06-04 MED ORDER — SODIUM CHLORIDE 0.9% FLUSH: 10.0000 mL | INTRAVENOUS | Status: DC | PRN

## 2018-06-04 MED ORDER — CHLORHEXIDINE GLUCONATE 0.12% ORAL RINSE (MEDLINE KIT)
15.0000 mL | Freq: Two times a day (BID) | OROMUCOSAL | Status: DC
  Administered 2018-06-04 – 2018-06-07 (×6): 15 mL via OROMUCOSAL

## 2018-06-04 MED ORDER — FENTANYL CITRATE (PF) 100 MCG/2ML IJ SOLN: 50.0000 ug | Freq: Once | INTRAMUSCULAR | Status: DC

## 2018-06-04 MED ORDER — SODIUM CHLORIDE 0.9 % IV SOLN
1.0000 g | INTRAVENOUS | Status: DC
  Administered 2018-06-04: 1 g via INTRAVENOUS
  Filled 2018-06-04: qty 1

## 2018-06-04 MED ORDER — VANCOMYCIN HCL 10 G IV SOLR
1500.0000 mg | Freq: Once | INTRAVENOUS | Status: AC
  Administered 2018-06-04: 1500 mg via INTRAVENOUS
  Filled 2018-06-04: qty 1500

## 2018-06-04 MED ORDER — ORAL CARE MOUTH RINSE
15.0000 mL | OROMUCOSAL | Status: DC
  Administered 2018-06-04 – 2018-06-07 (×30): 15 mL via OROMUCOSAL

## 2018-06-04 MED ORDER — SODIUM CHLORIDE 0.9% FLUSH
10.0000 mL | Freq: Two times a day (BID) | INTRAVENOUS | Status: DC
  Administered 2018-06-04: 10 mL
  Administered 2018-06-04: 20 mL
  Administered 2018-06-05 – 2018-06-08 (×5): 10 mL
  Administered 2018-06-08: 20 mL
  Administered 2018-06-08 – 2018-06-10 (×4): 10 mL
  Administered 2018-06-10: 3 mL
  Administered 2018-06-11 – 2018-06-12 (×3): 10 mL

## 2018-06-04 MED ORDER — FENTANYL CITRATE (PF) 100 MCG/2ML IJ SOLN
INTRAMUSCULAR | Status: AC
  Filled 2018-06-04: qty 2

## 2018-06-04 MED ORDER — SUCCINYLCHOLINE CHLORIDE 20 MG/ML IJ SOLN
INTRAMUSCULAR | Status: AC | PRN
  Administered 2018-06-04: 100 mg via INTRAVENOUS

## 2018-06-04 MED ORDER — INSULIN ASPART 100 UNIT/ML ~~LOC~~ SOLN
0.0000 [IU] | SUBCUTANEOUS | Status: DC
  Administered 2018-06-04 – 2018-06-09 (×8): 1 [IU] via SUBCUTANEOUS

## 2018-06-04 MED ORDER — CHLORHEXIDINE GLUCONATE CLOTH 2 % EX PADS
6.0000 | MEDICATED_PAD | Freq: Every day | CUTANEOUS | Status: DC
  Administered 2018-06-04 – 2018-06-09 (×6): 6 via TOPICAL

## 2018-06-04 NOTE — Progress Notes (Signed)
  Echocardiogram 2D Echocardiogram has been attempted. Patient being moved to new unit. Nurse stated to reattempt in an hour.  Kim [name] 06/04/2018, 9:34 AM

## 2018-06-04 NOTE — ED Provider Notes (Signed)
[facility] EMERGENCY DEPARTMENT Provider Note   CSN: [id number] Arrival date & time: 06/04/18  9417     History   Chief Complaint No chief complaint on file.   HPI Kim [name] is a 63 y.o. female.  Patient presents to the ER after cardiac arrest.  Patient reportedly is staying at a local shelter.  She reportedly went outside with some friends and collapsed.  Friends told EMS that she suddenly stated that she did not feel well right before she collapsed.  Witness as called 911.  Witnesses report that she did have an episode where she was shaking, possibly like a seizure.  There was a 5-minute or less response time for first responders.  When they arrived on the scene patient was pulseless and they initiated CPR.  They placed a King's airway and began to bag her.  They placed her on AED which did not recommend shocking.  EMS report asystole.  They continued CPR, administered 2 doses of epi and then regained pulses. Level V Caveat due to acuity.     Past Medical History:  Diagnosis Date  . CHF (congestive heart failure) ([place])   . COPD (chronic obstructive pulmonary disease) ([place])   . Diabetes mellitus without complication ([place])   . GERD (gastroesophageal reflux disease)   . History of DVT (deep vein thrombosis)   . Hypercholesteremia   . Hyperlipemia   . Normal coronary arteries    by cardiac catheterization which I performed September 2015  . Schizophrenia ([place])   . Tobacco abuse     Patient Active Problem List   Diagnosis Date Noted  . COPD with emphysema ([place]) 04/07/2014  . Acute on chronic diastolic heart failure ([place]) 03/18/2014  . DVT, popliteal, acute ([place]) 03/18/2014  . Tobacco abuse 03/18/2014  . Coronary artery disease, non-occlusive 02/10/2014  . Angina, class III - related to Acute Diastolic HF & COPD Exacerbation; Non-obstructive CAD 02/09/2014  . Hyperlipidemia 08/28/2008    Past Surgical History:  Procedure Laterality Date  . LEFT  HEART CATHETERIZATION WITH CORONARY ANGIOGRAM N/A 02/09/2014   Procedure: LEFT HEART CATHETERIZATION WITH CORONARY ANGIOGRAM;  Surgeon: Lorretta Harp, MD;  Location: [facility] CATH LAB;  Service: Cardiovascular;  Laterality: N/A;  . none       OB History   No obstetric history on file.      Home Medications    Prior to Admission medications   Medication Sig Start Date End Date Taking? Authorizing Provider  albuterol (VENTOLIN HFA) 108 (90 Base) MCG/ACT inhaler Inhale 2 puffs into the lungs every 4 (four) hours as needed for wheezing or shortness of breath. 10/23/17   Rigoberto Noel, MD  aspirin 81 MG chewable tablet Chew 1 tablet (81 mg total) by mouth daily. 03/13/18   [name], FNP  azithromycin (ZITHROMAX) 250 MG tablet Take 1 tablet (250 mg total) by mouth daily. Take first 2 tablets together, then 1 every day until finished. 04/03/18   Hedges, Dellis Filbert, PA-C  benzonatate (TESSALON) 100 MG capsule Take 1 capsule (100 mg total) by mouth every 8 (eight) hours. 04/03/18   Hedges, Dellis Filbert, PA-C  cholecalciferol (VITAMIN D) 1000 units tablet Take 1,000 Units by mouth daily.    [provider]  Fluticasone-Salmeterol (ADVAIR) 250-50 MCG/DOSE AEPB Inhale 1 puff into the lungs 2 (two) times daily. 03/13/18   [name], FNP  furosemide (LASIX) 20 MG tablet TAKE 1 OR 2 TABLETS BY MOUTH AS NEEDED 03/13/18   [name], FNP  risperiDONE (RISPERDAL) 2 MG tablet Take 2 mg by mouth at bedtime.    [provider]  tiotropium (SPIRIVA) 18 MCG inhalation capsule Place 1 capsule (18 mcg total) into inhaler and inhale daily. 03/13/18   [name], FNP    Family History No family history on file.  Social History Social History   Tobacco Use  . Smoking status: Current Every Day Smoker    Packs/day: 1.00    Years: 21.00    Pack years: 21.00    Types: Cigarettes  . Smokeless tobacco: Never Used  . Tobacco comment: smoking 1/2-1ppd   Substance Use Topics  . Alcohol use:  No  . Drug use: No     Allergies   Asa [aspirin]; Metformin and related; Penicillins; Quetiapine; and Acetaminophen   Review of Systems Review of Systems  Unable to perform ROS: Acuity of condition     Physical Exam Updated Vital Signs BP 90/70   Pulse (!) 104   Resp 18   SpO2 100%   Physical Exam Constitutional:      General: She is in acute distress.     Appearance: She is ill-appearing.  HENT:     Head: Atraumatic.  Neck:     Musculoskeletal: Neck supple.  Cardiovascular:     Rate and Rhythm: Tachycardia present.     Heart sounds: Normal heart sounds.  Pulmonary:     Comments: Coarse breath sounds bilateral, bagged via King's airway Abdominal:     Palpations: Abdomen is soft.  Musculoskeletal:        General: No swelling or deformity.  Skin:    General: Skin is dry.  Neurological:     Comments: Unresponsive      ED Treatments / Results  Labs (all labs ordered are listed, but only abnormal results are displayed) Labs Reviewed  I-STAT CG4 LACTIC ACID, ED - Abnormal; Notable for the following components:      Result Value   Lactic Acid, Venous 8.70 (*)    All other components within normal limits  BLOOD GAS, ARTERIAL  BASIC METABOLIC PANEL  CBC  APTT  PROTIME-INR  I-STAT TROPONIN, ED  CBG MONITORING, ED    EKG None  Radiology No results found.  Procedures Procedure Name: Intubation Date/Time: 06/04/2018 7:33 AM Performed by: Orpah Greek, MD Pre-anesthesia Checklist: Patient identified, Emergency Drugs available, Suction available, Patient being monitored and Timeout performed Oxygen Delivery Method: Ambu bag Preoxygenation: Pre-oxygenation with 100% oxygen Induction Type: Rapid sequence Ventilation: Mask ventilation without difficulty Laryngoscope Size: Glidescope and 3 Grade View: Grade I Tube size: 7.5 mm Number of attempts: 1 Placement Confirmation: ETT inserted through vocal cords under direct vision,  Breath sounds  checked- equal and bilateral and CO2 detector Secured at: 24 cm Tube secured with: ETT holder Dental Injury: Teeth and Oropharynx as per pre-operative assessment       (including critical care time)  Medications Ordered in ED Medications  0.9 %  sodium chloride infusion (has no administration in time range)  fentaNYL (SUBLIMAZE) 100 MCG/2ML injection (has no administration in time range)  succinylcholine (ANECTINE) injection (100 mg Intravenous Given 06/04/18 0646)  etomidate (AMIDATE) injection (20 mg Intravenous Given 06/04/18 0647)  fentaNYL (SUBLIMAZE) injection 100 mcg (100 mcg Intravenous Given 06/04/18 0708)     Initial Impression / Assessment and Plan / ED Course  I have reviewed the triage vital signs and the nursing notes.  Pertinent labs & imaging results that were available during my care of the patient  were reviewed by me and considered in my medical decision making (see chart for details).     Presents to the emergency department for evaluation of cardiac arrest.  Patient suddenly collapsed while talking to friends.  There may have been as much as 5 minutes of downtime prior to arrival of first responders who initiated CPR.  EMS continued CPR and administered epi, regained pulses.  Patient brought to the ER with spontaneous circulation.  EKG does not show any ischemia or infarct.  X-ray with mild edema.  Will obtain CT head and cervical spine as patient did fall.  Reviewing records reveals she does have a history of DVT, consider PE.     Patient initially normotensive, has become more hypotensive over time.  Administering IV fluids and initiate Levophed.  Will consult critical care medicine admission.  CRITICAL CARE Performed by: Orpah Greek   Total critical care time: 40 minutes  Critical care time was exclusive of separately billable procedures and treating other patients.  Critical care was necessary to treat or prevent imminent or life-threatening  deterioration.  Critical care was time spent personally by me on the following activities: development of treatment plan with patient and/or surrogate as well as nursing, discussions with consultants, evaluation of patient's response to treatment, examination of patient, obtaining history from patient or surrogate, ordering and performing treatments and interventions, ordering and review of laboratory studies, ordering and review of radiographic studies, pulse oximetry and re-evaluation of patient's condition.   Final Clinical Impressions(s) / ED Diagnoses   Final diagnoses:  Cardiac arrest [facility])    ED Discharge Orders    None       Orpah Greek, MD 06/04/18 [phone]

## 2018-06-04 NOTE — Progress Notes (Signed)
EEG Completed; Results Pending  

## 2018-06-04 NOTE — Telephone Encounter (Signed)
Called to schedule AWV-s with patient and person that answered states this person no longer resides at the home phone number in computer [phone].  lec

## 2018-06-04 NOTE — ED Notes (Signed)
SUGAR 151

## 2018-06-04 NOTE — Procedures (Signed)
Central Venous Catheter Insertion Procedure Note Kim [name] [id number] May 27, 1955  Procedure: Insertion of Central Venous Catheter Indications: Assessment of intravascular volume, Drug and/or fluid administration and Frequent blood sampling  Procedure Details Consent: Unable to obtain consent because of altered level of consciousness. Time Out: Verified patient identification, verified procedure, site/side was marked, verified correct patient position, special equipment/implants available, medications/allergies/relevent history reviewed, required imaging and test results available.  Performed  Maximum sterile technique was used including antiseptics, cap, gloves, gown, hand hygiene, mask and sheet. Skin prep: Chlorhexidine; local anesthetic administered A antimicrobial bonded/coated triple lumen catheter was placed in the left internal jugular vein using the Seldinger technique. Ultrasound guidance used.Yes.   Catheter placed to 20 cm. Blood aspirated via all 3 ports and then flushed x 3. Line sutured x 2 and dressing applied.  Evaluation Blood flow good Complications: No apparent complications Patient did tolerate procedure well. Chest X-ray ordered to verify placement.  CXR: pending.  Richardson Landry Minor ACNP Maryanna Shape PCCM Pager [phone] till 1 pm If no answer page [phone] 06/04/2018, 8:49 AM  l

## 2018-06-04 NOTE — Progress Notes (Signed)
  Echocardiogram 2D Echocardiogram has been performed.  Lasalle Abee G Jameika Kinn 06/04/2018, 11:15 AM

## 2018-06-04 NOTE — Code Documentation (Signed)
Ice packs applied

## 2018-06-04 NOTE — H&P (Addendum)
NAME:  Kim [name], MRN:  [id number], DOB:  10-Aug-1955, LOS: 0 ADMISSION DATE:  06/04/2018 REFERRING MD: Emergency department physician, CHIEF COMPLAINT: Status post cardiac arrest  Brief History   10 female admitted 06/04/2018 post cardiac arrest  History of present illness   63 year old female who is to the homeless shelter went outside to smoke with her friends noted to be short of breath and collapsed to the ground.  EMS was activated they arrived on scene shock was not advised he did CPR and 2 rounds of epinephrine with return of spontaneous circulation within 2 minutes.  She was transferred to [facility] he had been intubated.  Pulmonary critical care was called to the bedside.  She was deemed not to be a candidate for hypothermia as she had been purposeful and with short downtime duration.  Should be transferred to intensive care unit.  He does have a mixed metabolic respiratory acidosis was addressed with the ventilator.  Repeat arterial blood gases were ordered.  She had an elevated white count therefore empiric antimicrobial therapy and cultures were ordered.  Past Medical History    Significant Hospital Events   06/04/2018 admitted for presumed cardiac arrest  Consults:    Procedures:  06/04/2018 intubation 06/04/2018 left IJ CVL placed  Significant Diagnostic Tests:  06/04/2018 CT of the head 06/04/2018 EKG 06/04/2018 EEG 06/04/2018 2D echo  Micro Data:  06/04/2018 blood cultures x2 06/04/2018 sputum culture 06/04/2018 urine culture  Antimicrobials:  06/04/2018 vancomycin 06/04/2018 cefepime  Interim history/subjective:  63 year old female who is to the homeless shelter went outside to smoke with her friends noted to be short of breath and collapsed to the ground.  EMS was activated they arrived on scene shock was not advised he did CPR and 2 rounds of epinephrine with return of spontaneous circulation within 2 minutes.  She was transferred to [facility] he  had been intubated.  Pulmonary critical care was called to the bedside.  She was deemed not to be a candidate for hypothermia as she had been purposeful and with short downtime duration.  Should be transferred to intensive care unit.  He does have a mixed metabolic respiratory acidosis was addressed with the ventilator.  Repeat arterial blood gases were ordered.  She had an elevated white count therefore empiric antimicrobial therapy and cultures were ordered.  Objective   Blood pressure 99/82, pulse 62, temperature 97.7 F (36.5 C), resp. rate (!) 23, SpO2 92 %.    Vent Mode: PRVC FiO2 (%):  [100 %] 100 % Set Rate:  [18 bmp] 18 bmp Vt Set:  [500 mL] 500 mL PEEP:  [5 cmH20] 5 cmH20 Plateau Pressure:  [21 cmH20] 21 cmH20  No intake or output data in the 24 hours ending 06/04/18 0852 There were no vitals filed for this visit.  Examination: General: 63 year old female sedated on ventilator HENT: Tracheal tube in place, orogastric tube in place.  Pupils equal reactive light.  Positive JVD noted Lungs: Rhonchi with wheezing bilaterally Cardiovascular: Sounds are regular regular rate and rhythm sinus rhythm 66 currently on Levophed at 10 mics Abdomen: Be soft nontender Extremities: Warm and dry Neuro: Withdraws to noxious stimuli, reported to be purposeful. GU: Foley in place  Resolved Hospital Problem list     Assessment & Plan:  Vent dependent respiratory failure secondary to presumed cardiac arrest.  She was outside with friends noted to become unresponsive, complained of shortness of breath.  EMS found her asystole no shock via received  2 epi CPR return of spontaneous circulation within 5 minutes. Mixed metabolic respiratory acidosis Bronchospastic Thick sputum Elevated white count  -Vent bundle is noted -Bronchodilators secondary to bronchospasm -No steroids at this time -Empiric antibiotics -Culture -Follow chest x-ray Increased respiratory rate 20 6 repeat ABGs in 1  hour -Serial ABG  Status post cardiac arrest 06/04/2018 -Check EKG -Serial cardiac enzyme -2D echo for completeness -Cardiology consult near future -Does not need hypothermia protocol due to following commands. -Check drug screen for completeness  Altered mental status -CT of the head -Drug screen  Mixed metabolic respiratory acidosis, lactic acid 8.7 -Increase respiratory rate on ventilator -Repeat ABG -May need sodium bicarbonate -Trend lactic acid for clearing  Hyponatremia -Continue to monitor -No need for 3% saline at this time  Elevated glucose -Sliding scale insulin    Best practice:  Diet: N.p.o. Pain/Anxiety/Delirium protocol (if indicated): Fentanyl Versed VAP protocol (if indicated): Yes DVT prophylaxis: Sequential compression device will add heparin if CT of head negative GI prophylaxis: PPI Glucose control: Sliding scale insulin Mobility: Bedrest Code Status: Full Family Communication: 06/04/2018 no family at bedside Disposition: 06/04/2018 transfer to intensive care unit to heart  Labs   CBC: Recent Labs  Lab 06/04/18 0652  WBC 22.8*  HGB 17.2*  HCT 53.8*  MCV 97.5  PLT 329    Basic Metabolic Panel: Recent Labs  Lab 06/04/18 0652  NA 117*  K 4.4  CL 79*  CO2 20*  GLUCOSE 228*  BUN 17  CREATININE 1.55*  CALCIUM 9.2   GFR: CrCl cannot be calculated (Unknown ideal weight.). Recent Labs  Lab 06/04/18 0652 06/04/18 0658  WBC 22.8*  --   LATICACIDVEN  --  8.70*    Liver Function Tests: No results for input(s): AST, ALT, ALKPHOS, BILITOT, PROT, ALBUMIN in the last 168 hours. No results for input(s): LIPASE, AMYLASE in the last 168 hours. No results for input(s): AMMONIA in the last 168 hours.  ABG    Component Value Date/Time   PHART 7.176 (LL) 06/04/2018 0805   PCO2ART 87.2 (HH) 06/04/2018 0805   PO2ART 316.0 (H) 06/04/2018 0805   HCO3 32.4 (H) 06/04/2018 0805   TCO2 35 (H) 06/04/2018 0805   O2SAT 100.0 06/04/2018 0805      Coagulation Profile: Recent Labs  Lab 06/04/18 0652  INR 1.39    Cardiac Enzymes: No results for input(s): CKTOTAL, CKMB, CKMBINDEX, TROPONINI in the last 168 hours.  HbA1C: Hgb A1c MFr Bld  Date/Time Value Ref Range Status  03/13/2018 11:56 AM 5.7 (H) 4.8 - 5.6 % Final    Comment:             Prediabetes: 5.7 - 6.4          Diabetes: >6.4          Glycemic control for adults with diabetes: <7.0     CBG: Recent Labs  Lab 06/04/18 0812  GLUCAP 151*    Review of Systems:   Not available secondary to all needle status with intubation and sedation.  Past Medical History  She,  has a past medical history of CHF (congestive heart failure) ([place]), COPD (chronic obstructive pulmonary disease) ([place]), Diabetes mellitus without complication ([place]), GERD (gastroesophageal reflux disease), History of DVT (deep vein thrombosis), Hypercholesteremia, Hyperlipemia, Normal coronary arteries, Schizophrenia ([place]), and Tobacco abuse.   Surgical History    Past Surgical History:  Procedure Laterality Date  . LEFT HEART CATHETERIZATION WITH CORONARY ANGIOGRAM N/A 02/09/2014   Procedure: LEFT HEART CATHETERIZATION WITH  CORONARY ANGIOGRAM;  Surgeon: Lorretta Harp, MD;  Location: [facility] CATH LAB;  Service: Cardiovascular;  Laterality: N/A;  . none       Social History   reports that she has been smoking cigarettes. She has a 21.00 pack-year smoking history. She has never used smokeless tobacco. She reports that she does not drink alcohol or use drugs.   Family History   Her family history is not on file.   Allergies Allergies  Allergen Reactions  . Asa [Aspirin] Other (See Comments)    Stomach pain  . Metformin And Related Other (See Comments)    STOMACH PROBLEMS  . Penicillins Nausea And Vomiting    Large doses  . Quetiapine Other (See Comments)    REACTION: nausea/dizzy  . Acetaminophen Nausea And Vomiting and Rash     Home Medications  Prior to Admission medications    Medication Sig Start Date End Date Taking? Authorizing Provider  albuterol (VENTOLIN HFA) 108 (90 Base) MCG/ACT inhaler Inhale 2 puffs into the lungs every 4 (four) hours as needed for wheezing or shortness of breath. 10/23/17   Rigoberto Noel, MD  aspirin 81 MG chewable tablet Chew 1 tablet (81 mg total) by mouth daily. 03/13/18   [name], FNP  azithromycin (ZITHROMAX) 250 MG tablet Take 1 tablet (250 mg total) by mouth daily. Take first 2 tablets together, then 1 every day until finished. 04/03/18   Hedges, Dellis Filbert, PA-C  benzonatate (TESSALON) 100 MG capsule Take 1 capsule (100 mg total) by mouth every 8 (eight) hours. 04/03/18   Hedges, Dellis Filbert, PA-C  cholecalciferol (VITAMIN D) 1000 units tablet Take 1,000 Units by mouth daily.    [provider]  Fluticasone-Salmeterol (ADVAIR) 250-50 MCG/DOSE AEPB Inhale 1 puff into the lungs 2 (two) times daily. 03/13/18   [name], FNP  furosemide (LASIX) 20 MG tablet TAKE 1 OR 2 TABLETS BY MOUTH AS NEEDED 03/13/18   [name], FNP  risperiDONE (RISPERDAL) 2 MG tablet Take 2 mg by mouth at bedtime.    [provider]  tiotropium (SPIRIVA) 18 MCG inhalation capsule Place 1 capsule (18 mcg total) into inhaler and inhale daily. 03/13/18   [name], FNP     Critical care time: 55 min    Richardson Landry Minor ACNP Maryanna Shape PCCM Pager [phone] till 1 pm If no answer page [phone] 06/04/2018, 8:52 AM  Attending Note:  63 year old female with a respiratory arrest resulting in asystolic cardiac arrest.  Patient was resuscitated quickly via EMS and while in the ER was purposeful but was hypotensive.  On exam, decreased lung sounds in all fields.  I reviewed CXR myself, ETT in place and pulmonary edema noted.  Discussed with PCCM-NP.  Will admit to the ICU, place TLC and a-line.  Pressors support as ordered.  Adjust vent for ABG.  EEG and CT of the brain.  Continue full support for now and address hemodynamics and will re-evaluate  in AM the respiratory and neuro status.  No cooling since was purposeful post resuscitation.  The patient is critically ill with multiple organ systems failure and requires high complexity decision making for assessment and support, frequent evaluation and titration of therapies, application of advanced monitoring technologies and extensive interpretation of multiple databases.   Critical Care Time devoted to patient care services described in this note is  45  Minutes. This time reflects time of care of this signee Dr Jennet Maduro. This critical care time does not reflect procedure  time, or teaching time or supervisory time of PA/NP/Med student/Med Resident etc but could involve care discussion time.  Rush Farmer, M.D. [facility] Pulmonary/Critical Care Medicine. Pager: [phone]. After hours pager: [phone].

## 2018-06-04 NOTE — ED Notes (Signed)
Critical lab value: sodium 117 Time reported to this RN: 2878 Time Dr. Betsey Holiday made aware: [phone]

## 2018-06-04 NOTE — Progress Notes (Signed)
Dr. Nelda Marseille notified of the patient's critical Troponin 0.11.

## 2018-06-04 NOTE — Procedures (Signed)
History: 63 year old female status post cardiac arrest  Sedation: Fentanyl 50 mcg/h  Technique: This is a 21 channel routine scalp EEG performed at the bedside with bipolar and monopolar montages arranged in accordance to the international 10/20 system of electrode placement. One channel was dedicated to EKG recording.    Background: The background is disorganized consisting of diffuse irregular delta and theta activities.  There was no posterior dominant rhythm or sleep structures seen.  Photic stimulation: Physiologic driving is not performed  EEG Abnormalities: 1) generalized irregular slow activity 2) absent PDR  Clinical Interpretation: This EEG is consistent with a moderate to severe generalized nonspecific cerebral dysfunction (encephalopathy).   There was no seizure or seizure predisposition recorded on this study. Please note that lack of epileptiform activity on EEG does not preclude the possibility of epilepsy.   Roland Rack, MD [facility] [phone]  If 7pm- 7am, please page neurology on call as listed in [place].

## 2018-06-04 NOTE — Progress Notes (Signed)
Pt brought from ED to [place] on ventilator.

## 2018-06-04 NOTE — Telephone Encounter (Signed)
Called to schedule Medicare Annual Wellness Visit with the Nurse Health Advisor.   If patient returns call, please note: their last AWV was on12/11//18 please schedule AWV with [facility] any date AFTER 05/01/2018.   Patient also needs an office visit scheduled with [name], FNP.    Thank you! For any questions please contact: Janace Hoard at [phone] or Skype [email or web address]

## 2018-06-04 NOTE — ED Triage Notes (Signed)
Patient was at the shelter; c/o not feeling well then fell to the ground. Possible seizure-like activity.

## 2018-06-04 NOTE — Progress Notes (Addendum)
Pharmacy Antibiotic Note  Kim [name] is a 63 y.o. female admitted on 06/04/2018 with sepsis.  Pharmacy has been consulted for vancomycin and cefepime dosing.  Most recent weight in Epic = 178 lbs 03/2018.  Plan: Vancomycin 1500 mg x 1 now, then Vancomycin 1250 mg IV every 24 hours.  Goal trough 15-20 mcg/mL.  Cefepime 1g q 12 hrs for now. F/u renal function, cultures, and clinical course. Height: 5\' 5"  (165.1 cm) Weight: 180 lb 1.9 oz (81.7 kg) IBW/kg (Calculated) : 57  Temp (24hrs), Avg:98.4 F (36.9 C), Min:93.6 F (34.2 C), Max:99.5 F (37.5 C)  Recent Labs  Lab 06/04/18 0652 06/04/18 0658 06/04/18 1051  WBC 22.8*  --  24.4*  CREATININE 1.55*  --  0.80  LATICACIDVEN  --  8.70* 1.5    Estimated Creatinine Clearance: 76 mL/min (by C-G formula based on SCr of 0.8 mg/dL).    Allergies  Allergen Reactions  . Asa [Aspirin] Other (See Comments)    Stomach pain  . Metformin And Related Other (See Comments)    STOMACH PROBLEMS  . Penicillins Nausea And Vomiting    Large doses  . Quetiapine Other (See Comments)    REACTION: nausea/dizzy  . Acetaminophen Nausea And Vomiting and Rash    Antimicrobials this admission:  Vancomycin 1/14 > Cefepime 1/14 >   Dose adjustments this admission:   Microbiology results:  1/14 BCx x 2:  1/14 UCx:  1/14 Sputum:    Thank you for allowing pharmacy to be a part of this patient's care.  Marguerite Olea, [facility] Clinical Pharmacist Phone [phone]  06/04/2018 3:14 PM

## 2018-06-05 ENCOUNTER — Inpatient Hospital Stay (HOSPITAL_COMMUNITY): Payer: Medicare HMO

## 2018-06-05 ENCOUNTER — Encounter (HOSPITAL_COMMUNITY): Payer: Self-pay

## 2018-06-05 DIAGNOSIS — J9601 Acute respiratory failure with hypoxia: Secondary | ICD-10-CM

## 2018-06-05 DIAGNOSIS — E871 Hypo-osmolality and hyponatremia: Secondary | ICD-10-CM

## 2018-06-05 DIAGNOSIS — J810 Acute pulmonary edema: Secondary | ICD-10-CM

## 2018-06-05 LAB — HIV ANTIBODY (ROUTINE TESTING W REFLEX): HIV Screen 4th Generation wRfx: NONREACTIVE

## 2018-06-05 LAB — GLUCOSE, CAPILLARY
GLUCOSE-CAPILLARY: 88 mg/dL (ref 70–99)
Glucose-Capillary: 117 mg/dL — ABNORMAL HIGH (ref 70–99)
Glucose-Capillary: 136 mg/dL — ABNORMAL HIGH (ref 70–99)
Glucose-Capillary: 142 mg/dL — ABNORMAL HIGH (ref 70–99)
Glucose-Capillary: 177 mg/dL — ABNORMAL HIGH (ref 70–99)
Glucose-Capillary: 64 mg/dL — ABNORMAL LOW (ref 70–99)
Glucose-Capillary: 92 mg/dL (ref 70–99)
Glucose-Capillary: 95 mg/dL (ref 70–99)

## 2018-06-05 LAB — BLOOD GAS, ARTERIAL
Acid-Base Excess: 3.6 mmol/L — ABNORMAL HIGH (ref 0.0–2.0)
BICARBONATE: 28.4 mmol/L — AB (ref 20.0–28.0)
Drawn by: 347621
FIO2: 0.6
MECHVT: 450 mL
O2 Saturation: 97.3 %
PEEP: 5 cmH2O
Patient temperature: 100.4
RATE: 26 resp/min
pCO2 arterial: 52.1 mmHg — ABNORMAL HIGH (ref 32.0–48.0)
pH, Arterial: 7.361 (ref 7.350–7.450)
pO2, Arterial: 107 mmHg (ref 83.0–108.0)

## 2018-06-05 LAB — BASIC METABOLIC PANEL
Anion gap: 7 (ref 5–15)
BUN: 10 mg/dL (ref 8–23)
CO2: 31 mmol/L (ref 22–32)
Calcium: 8.6 mg/dL — ABNORMAL LOW (ref 8.9–10.3)
Chloride: 88 mmol/L — ABNORMAL LOW (ref 98–111)
Creatinine, Ser: 0.76 mg/dL (ref 0.44–1.00)
GFR calc non Af Amer: >60 mL/min (ref >60)
Glucose, Bld: 88 mg/dL (ref 70–99)
POTASSIUM: 4.3 mmol/L (ref 3.5–5.1)
Sodium: 126 mmol/L — ABNORMAL LOW (ref 135–145)

## 2018-06-05 LAB — URINE CULTURE: Culture: NO GROWTH

## 2018-06-05 LAB — CBC
HCT: 40.8 % (ref 36.0–46.0)
Hemoglobin: 13.6 g/dL (ref 12.0–15.0)
MCH: 31.5 pg (ref 26.0–34.0)
MCHC: 33.3 g/dL (ref 30.0–36.0)
MCV: 94.4 fL (ref 80.0–100.0)
PLATELETS: 153 10*3/uL (ref 150–400)
RBC: 4.32 MIL/uL (ref 3.87–5.11)
RDW: 12.4 % (ref 11.5–15.5)
WBC: 17.2 10*3/uL — AB (ref 4.0–10.5)
nRBC: 0 % (ref 0.0–0.2)

## 2018-06-05 LAB — PROCALCITONIN: Procalcitonin: 0.81 ng/mL

## 2018-06-05 MED ORDER — FUROSEMIDE 10 MG/ML IJ SOLN
40.0000 mg | Freq: Four times a day (QID) | INTRAMUSCULAR | Status: AC
  Administered 2018-06-05 (×3): 40 mg via INTRAVENOUS
  Filled 2018-06-05 (×3): qty 4

## 2018-06-05 MED ORDER — STERILE WATER FOR INJECTION IJ SOLN
INTRAMUSCULAR | Status: AC
  Administered 2018-06-05: 10 mL
  Filled 2018-06-05: qty 10

## 2018-06-05 MED ORDER — INFLUENZA VAC SPLIT QUAD 0.5 ML IM SUSY
0.5000 mL | PREFILLED_SYRINGE | INTRAMUSCULAR | Status: AC | PRN
  Administered 2018-06-12: 0.5 mL via INTRAMUSCULAR
  Filled 2018-06-05: qty 0.5

## 2018-06-05 MED ORDER — DEXTROSE 50 % IV SOLN
INTRAVENOUS | Status: AC
  Administered 2018-06-05: 50 mL
  Filled 2018-06-05: qty 50

## 2018-06-05 MED ORDER — METHYLPREDNISOLONE SODIUM SUCC 40 MG IJ SOLR
40.0000 mg | Freq: Four times a day (QID) | INTRAMUSCULAR | Status: AC
  Administered 2018-06-05 (×3): 40 mg via INTRAVENOUS
  Filled 2018-06-05 (×3): qty 1

## 2018-06-05 MED ORDER — SODIUM CHLORIDE 0.9 % IV SOLN
2.0000 g | Freq: Two times a day (BID) | INTRAVENOUS | Status: DC
  Administered 2018-06-05 – 2018-06-06 (×4): 2 g via INTRAVENOUS
  Filled 2018-06-05 (×5): qty 2

## 2018-06-05 MED ORDER — ENOXAPARIN SODIUM 40 MG/0.4ML ~~LOC~~ SOLN
40.0000 mg | SUBCUTANEOUS | Status: DC
  Administered 2018-06-05 – 2018-06-12 (×8): 40 mg via SUBCUTANEOUS
  Filled 2018-06-05 (×8): qty 0.4

## 2018-06-05 MED ORDER — POTASSIUM CHLORIDE 20 MEQ/15ML (10%) PO SOLN
40.0000 meq | Freq: Three times a day (TID) | ORAL | Status: AC
  Administered 2018-06-05 (×2): 40 meq
  Filled 2018-06-05 (×2): qty 30

## 2018-06-05 MED ORDER — ACETAZOLAMIDE SODIUM 500 MG IJ SOLR
250.0000 mg | Freq: Four times a day (QID) | INTRAMUSCULAR | Status: AC
  Administered 2018-06-05 (×3): 250 mg via INTRAVENOUS
  Filled 2018-06-05 (×3): qty 250

## 2018-06-05 NOTE — Progress Notes (Signed)
Hypoglycemic Event  CBG: 64   Treatment: 1 amp of Dextrose 50% solution Symptoms: UTA (patient sedated)  Follow-up CBG: [id number] CBG Result:177  Possible Reasons for Event:NPO  Comments/MD notified:Will continue to monitor    Orpah Greek

## 2018-06-05 NOTE — Progress Notes (Signed)
ANTICOAGULATION CONSULT NOTE - Initial Consult  Pharmacy Consult for heparin Indication: VTE prophylaxis  Allergies  Allergen Reactions  . Asa [Aspirin] Other (See Comments)    Stomach pain  . Metformin And Related Other (See Comments)    STOMACH PROBLEMS  . Penicillins Nausea And Vomiting    Large doses  . Quetiapine Other (See Comments)    REACTION: nausea/dizzy  . Acetaminophen Nausea And Vomiting and Rash    Patient Measurements: Height: _0  (165.1 cm) Weight: 172 lb 6.4 oz (78.2 kg) IBW/kg (Calculated) : 57  Labs: Recent Labs    06/04/18 0652 06/04/18 1051 06/04/18 1142 06/04/18 1306 06/04/18 1950 06/05/18 0357  HGB 17.2* 15.5*  --   --   --  13.6  HCT 53.8* 44.8  --   --   --  40.8  PLT 238 122*  --   --   --  153  APTT 49*  --  35  --   --   --   LABPROT 16.9*  --  19.8*  --   --   --   INR 1.39  --  1.70  --   --   --   CREATININE 1.55* 0.80  --   --   --  0.76  TROPONINI  --  0.11*  --  0.16* 0.11*  --     Estimated Creatinine Clearance: 74.4 mL/min (by C-G formula based on SCr of 0.76 mg/dL).   Medical History: Past Medical History:  Diagnosis Date  . CHF (congestive heart failure) ([place])   . COPD (chronic obstructive pulmonary disease) ([place])   . Diabetes mellitus without complication ([place])   . GERD (gastroesophageal reflux disease)   . History of DVT (deep vein thrombosis)   . Hypercholesteremia   . Hyperlipemia   . Normal coronary arteries    by cardiac catheterization which I performed September 2015  . Schizophrenia ([place])   . Tobacco abuse     Medications:  Scheduled:  . acetaZOLAMIDE  250 mg Intravenous Q6H  . chlorhexidine gluconate (MEDLINE KIT)  15 mL Mouth Rinse BID  . Chlorhexidine Gluconate Cloth  6 each Topical Daily  . enoxaparin (LOVENOX) injection  40 mg Subcutaneous Q24H  . fentaNYL (SUBLIMAZE) injection  50 mcg Intravenous Once  . folic acid  1 mg Intravenous Daily  . furosemide  40 mg Intravenous Q6H  . insulin aspart   0-9 Units Subcutaneous Q4H  . ipratropium-albuterol  3 mL Nebulization Q6H  . mouth rinse  15 mL Mouth Rinse 10 times per day  . methylPREDNISolone (SOLU-MEDROL) injection  40 mg Intravenous Q6H  . pantoprazole (PROTONIX) IV  40 mg Intravenous QHS  . potassium chloride  40 mEq Per Tube TID  . sodium chloride flush  10-40 mL Intracatheter Q12H  . thiamine injection  100 mg Intravenous Daily    Assessment: Pharmacy asked to add heparin for VTE prophylaxis.  Hgb with trend down, but no overt bleeding or complications noted.  Goal of Therapy:  Monitor platelets by anticoagulation protocol: Yes   Plan:  1. Start Lovenox 40 mg sq daily. 2. Pharmacy will sign-off and follow peripherally.  Marguerite Olea, [facility] Clinical Pharmacist Phone [phone]  06/05/2018 10:39 AM

## 2018-06-05 NOTE — Plan of Care (Signed)

## 2018-06-05 NOTE — Progress Notes (Addendum)
NAME:  Kim [name], MRN:  [id number], DOB:  1956-04-07, LOS: 1 ADMISSION DATE:  06/04/2018 REFERRING MD: Emergency department physician, CHIEF COMPLAINT: Status post cardiac arrest  Brief History   89 female admitted 06/04/2018 post cardiac arrest  History of present illness   63 year old female who is to the homeless shelter went outside to smoke with her friends noted to be short of breath and collapsed to the ground.  EMS was activated they arrived on scene shock was not advised he did CPR and 2 rounds of epinephrine with return of spontaneous circulation within 2 minutes.  She was transferred to [facility] he had been intubated.  Pulmonary critical care was called to the bedside.  She was deemed not to be a candidate for hypothermia as she had been purposeful and with short downtime duration.  Should be transferred to intensive care unit.  He does have a mixed metabolic respiratory acidosis was addressed with the ventilator.  Repeat arterial blood gases were ordered.  She had an elevated white count therefore empiric antimicrobial therapy and cultures were ordered.  Past Medical History  COPD  Significant Hospital Events   06/04/2018 admitted for presumed cardiac arrest  Consults:  none  Procedures:  06/04/2018 intubation 06/04/2018 left IJ CVL placed  Significant Diagnostic Tests:  06/04/2018 CT of the head 06/04/2018 EKG 06/04/2018 EEG 06/04/2018 2D echo  Micro Data:  06/04/2018 blood cultures x2 06/04/2018 sputum culture 06/04/2018 urine culture  Antimicrobials:  06/04/2018 vancomycin 06/04/2018 cefepime  Interim history/subjective:  Intubated, requiring slight increase in fentanyl ovenight per nursing. Patient nodding and following commands this morning.   Objective   Blood pressure (!) 95/59, pulse 83, temperature (!) 100.4 F (38 C), resp. rate (!) 21, height 5\' 5"  (1.651 m), weight 78.2 kg, SpO2 94 %.    Vent Mode: PRVC FiO2 (%):  [60 %-100 %] 60 % Set Rate:   [18 bmp-26 bmp] 26 bmp Vt Set:  [450 mL-500 mL] 450 mL PEEP:  [5 cmH20] 5 cmH20 Plateau Pressure:  [19 cmH20-22 cmH20] 20 cmH20   Intake/Output Summary (Last 24 hours) at 06/05/2018 0610 Last data filed at 06/05/2018 0500 Gross per 24 hour  Intake 2454.49 ml  Output 1893 ml  Net 561.49 ml   Filed Weights   06/04/18 1443 06/05/18 0500  Weight: 81.7 kg 78.2 kg    Examination: General: moderate sedation on ventilator, following commands  HENT: ETT tube in place, [place], AT Lungs: Rhonchi with wheezing bilaterally  Cardiovascular: RRR, no m/r/g Abdomen: +BS, soft, NTTP, non-distended Extremities: Warm and dry, trace edema  Neuro: opening eyes, following commands, able to lift arms GU: Foley in place  Resolved Hospital Problem list   Mixed metabolic acidosis   Assessment & Plan:  Vent dependent respiratory failure secondary to presumed cardiac arrest.  She was outside with friends noted to become unresponsive, complained of shortness of breath.  EMS found her asystole no shock via received 2 epi CPR return of spontaneous circulation within 5 minutes.  Mixed Respiratory Metabolic Acidosis Requiring Intubation Acute COPD Exacerbation Gram Negative Rod Multifocal Pneumonia/Enterovirus/Rhinovirus Resp culture growing moderate gram negative rods, RVP+ rhinovirus/enterovirus. Spiking fevers today. Bilateral wheezing, increased purulent sputum. CXR with b/l pleural effusions and diffuse  -WUA/SBT daily   -VAP -duonebs q6h, start steroids -Cont. cefepime -blood, urine cultures pending  -resp culture G- rods, culture and sensitivity pending  -Follow chest x-ray - will need f/u CT 3-4 weeks once pneumonia resolved due to nodular opacities  Status  post cardiac arrest 06/04/2018 Troponins peaked .16. Trending down. ECHO with EF 47-09%, grade 1 diastolic dysfunction -Cardiology consult near future -Does not need hypothermia protocol due to following commands.  Altered mental status EEG  consistent with acute encephalopathy. CT without acute abnormality. Following commands this morning. UDS negative.    Hyponatremia Significantly decreased at admission but was following commands not requiring 3%NaCl. Likely chronic. Continuing to improve from yesterday.  - cont. 75cc/hr NS -Continue to monitor -am labs  Hyperglycemia -Sliding scale insulin  Best practice:  Diet: N.p.o. Pain/Anxiety/Delirium protocol (if indicated): Fentanyl Versed VAP protocol (if indicated): Yes DVT prophylaxis: heparin GI prophylaxis: PPI Glucose control: Sliding scale insulin Mobility: Bedrest Code Status: Full Family Communication: 06/04/2018 no family at bedside Disposition: 06/04/2018 transfer to intensive care unit to heart  Labs   CBC: Recent Labs  Lab 06/04/18 0652 06/04/18 1051 06/05/18 0357  WBC 22.8* 24.4* 17.2*  HGB 17.2* 15.5* 13.6  HCT 53.8* 44.8 40.8  MCV 97.5 93.5 94.4  PLT 238 122* 628    Basic Metabolic Panel: Recent Labs  Lab 06/04/18 0652 06/04/18 1051 06/05/18 0357  NA 117* 122* 126*  K 4.4 4.4 4.3  CL 79* 87* 88*  CO2 20* 24 31  GLUCOSE 228* 129* 88  BUN 17 16 10   CREATININE 1.55* 0.80 0.76  CALCIUM 9.2 7.8* 8.6*  MG  --  1.3*  --   PHOS  --  3.4  --    GFR: Estimated Creatinine Clearance: 74.4 mL/min (by C-G formula based on SCr of 0.76 mg/dL). Recent Labs  Lab 06/04/18 0652 06/04/18 0658 06/04/18 1051 06/05/18 0357  PROCALCITON  --   --  0.36 0.81  WBC 22.8*  --  24.4* 17.2*  LATICACIDVEN  --  8.70* 1.5  --     Liver Function Tests: Recent Labs  Lab 06/04/18 1051  AST 40  ALT 28  ALKPHOS 84  BILITOT 1.3*  PROT 4.8*  ALBUMIN 2.2*   Recent Labs  Lab 06/04/18 1051  LIPASE 18  AMYLASE 195*   No results for input(s): AMMONIA in the last 168 hours.  ABG    Component Value Date/Time   PHART 7.361 06/05/2018 0500   PCO2ART 52.1 (H) 06/05/2018 0500   PO2ART 107 06/05/2018 0500   HCO3 28.4 (H) 06/05/2018 0500   TCO2 33 (H)  06/04/2018 0930   O2SAT 97.3 06/05/2018 0500     Coagulation Profile: Recent Labs  Lab 06/04/18 0652 06/04/18 1142  INR 1.39 1.70    Cardiac Enzymes: Recent Labs  Lab 06/04/18 1051 06/04/18 1306 06/04/18 1950  TROPONINI 0.11* 0.16* 0.11*    HbA1C: Hgb A1c MFr Bld  Date/Time Value Ref Range Status  03/13/2018 11:56 AM 5.7 (H) 4.8 - 5.6 % Final    Comment:             Prediabetes: 5.7 - 6.4          Diabetes: >6.4          Glycemic control for adults with diabetes: <7.0     CBG: Recent Labs  Lab 06/04/18 1528 06/04/18 1957 06/04/18 2355 06/05/18 0404 06/05/18 0441  GLUCAP 81 103* 92 64* 177*    Review of Systems:   Unable to obtain ROS due to intubation and sedation   Past Medical History  She,  has a past medical history of CHF (congestive heart failure) ([place]), COPD (chronic obstructive pulmonary disease) ([place]), Diabetes mellitus without complication ([place]), GERD (gastroesophageal reflux disease), History of  DVT (deep vein thrombosis), Hypercholesteremia, Hyperlipemia, Normal coronary arteries, Schizophrenia ([place]), and Tobacco abuse.   Surgical History    Past Surgical History:  Procedure Laterality Date  . LEFT HEART CATHETERIZATION WITH CORONARY ANGIOGRAM N/A 02/09/2014   Procedure: LEFT HEART CATHETERIZATION WITH CORONARY ANGIOGRAM;  Surgeon: Lorretta Harp, MD;  Location: [facility];  Service: Cardiovascular;  Laterality: N/A;  . none       Social History   reports that she has been smoking cigarettes. She has a 21.00 pack-year smoking history. She has never used smokeless tobacco. She reports that she does not drink alcohol or use drugs.   Family History   Her family history is not on file.   Allergies Allergies  Allergen Reactions  . Asa [Aspirin] Other (See Comments)    Stomach pain  . Metformin And Related Other (See Comments)    STOMACH PROBLEMS  . Penicillins Nausea And Vomiting    Large doses  . Quetiapine Other (See Comments)     REACTION: nausea/dizzy  . Acetaminophen Nausea And Vomiting and Rash     Home Medications  Prior to Admission medications   Medication Sig Start Date End Date Taking? Authorizing Provider  albuterol (VENTOLIN HFA) 108 (90 Base) MCG/ACT inhaler Inhale 2 puffs into the lungs every 4 (four) hours as needed for wheezing or shortness of breath. 10/23/17   Rigoberto Noel, MD  aspirin 81 MG chewable tablet Chew 1 tablet (81 mg total) by mouth daily. 03/13/18   [name], FNP  azithromycin (ZITHROMAX) 250 MG tablet Take 1 tablet (250 mg total) by mouth daily. Take first 2 tablets together, then 1 every day until finished. 04/03/18   Hedges, Dellis Filbert, PA-C  benzonatate (TESSALON) 100 MG capsule Take 1 capsule (100 mg total) by mouth every 8 (eight) hours. 04/03/18   Hedges, Dellis Filbert, PA-C  cholecalciferol (VITAMIN D) 1000 units tablet Take 1,000 Units by mouth daily.    [provider]  Fluticasone-Salmeterol (ADVAIR) 250-50 MCG/DOSE AEPB Inhale 1 puff into the lungs 2 (two) times daily. 03/13/18   [name], FNP  furosemide (LASIX) 20 MG tablet TAKE 1 OR 2 TABLETS BY MOUTH AS NEEDED 03/13/18   [name], FNP  risperiDONE (RISPERDAL) 2 MG tablet Take 2 mg by mouth at bedtime.    [provider]  tiotropium (SPIRIVA) 18 MCG inhalation capsule Place 1 capsule (18 mcg total) into inhaler and inhale daily. 03/13/18   [name], FNP         Marty Heck, DO 06/05/2018, 6:11 AM Pager: [phone]  Attending Note:  63 year old female who collapsed while smoking outside and was in PEA arrest.  Patient was brought the ED and there was a concern for seizure.  EEG was negative, no further seizure activity.  Patient was not cooled due to improvement in mental status.  This AM, patient is arousable and following some commands.  Not weaning on exam with coarse BS diffusely.  I reviewed CXR myself diffuse pulmonary edema noted.  Will KVO IVF.  Lasix 40 mg IV q6 x3 doses.  Diamox 250  mg IV q6 3 doses.  KCl replacement.  Hold off weaning today.  Titrate O2 for sat of 88-92%.  Will attempt weaning in AM.  The patient is critically ill with multiple organ systems failure and requires high complexity decision making for assessment and support, frequent evaluation and titration of therapies, application of advanced monitoring technologies and extensive interpretation of multiple databases.   Critical  Care Time devoted to patient care services described in this note is  33  Minutes. This time reflects time of care of this signee Dr Jennet Maduro. This critical care time does not reflect procedure time, or teaching time or supervisory time of PA/NP/Med student/Med Resident etc but could involve care discussion time.  Rush Farmer, M.D. [facility] Pulmonary/Critical Care Medicine. Pager: [phone]. After hours pager: [phone].

## 2018-06-05 NOTE — Progress Notes (Addendum)
Pharmacy Antibiotic Note  Kim [name] is a 63 y.o. female admitted on 06/04/2018 with sepsis.  Pharmacy has been consulted for vancomycin and cefepime dosing.  Most recent weight in Epic = 178 lbs 03/2018. Scr much improved from admission.  So far respiratory culture only with GNR, MRSA screen neg.  Plan: Change cefepime to 2g q 12 hrs. Discussed with Dr. Nelda Marseille - will stop vancomycin. F/u renal function, cultures, and clinical course.   Height: 5\' 5"  (165.1 cm) Weight: 172 lb 6.4 oz (78.2 kg) IBW/kg (Calculated) : 57  Temp (24hrs), Avg:99.7 F (37.6 C), Min:98.4 F (36.9 C), Max:100.8 F (38.2 C)  Recent Labs  Lab 06/04/18 0652 06/04/18 0658 06/04/18 1051 06/05/18 0357  WBC 22.8*  --  24.4* 17.2*  CREATININE 1.55*  --  0.80 0.76  LATICACIDVEN  --  8.70* 1.5  --     Estimated Creatinine Clearance: 74.4 mL/min (by C-G formula based on SCr of 0.76 mg/dL).    Allergies  Allergen Reactions  . Asa [Aspirin] Other (See Comments)    Stomach pain  . Metformin And Related Other (See Comments)    STOMACH PROBLEMS  . Penicillins Nausea And Vomiting    Large doses  . Quetiapine Other (See Comments)    REACTION: nausea/dizzy  . Acetaminophen Nausea And Vomiting and Rash    Antimicrobials this admission:  Vancomycin 1/14 > 1/15 Cefepime 1/14 >   Dose adjustments this admission:   Microbiology results:  1/14 BCx x 2: ngtd 1/14 UCx: neg 1/14 Sputum: reincubated 1/14 - Resp panel + rhinovirus/enterovirus 1/14 MRSA screen neg   Thank you for allowing pharmacy to be a part of this patient's care.  Marguerite Olea, [facility] Clinical Pharmacist Phone [phone]  06/05/2018 10:45 AM

## 2018-06-06 ENCOUNTER — Inpatient Hospital Stay (HOSPITAL_COMMUNITY): Payer: Medicare HMO

## 2018-06-06 DIAGNOSIS — J181 Lobar pneumonia, unspecified organism: Secondary | ICD-10-CM

## 2018-06-06 DIAGNOSIS — G9340 Encephalopathy, unspecified: Secondary | ICD-10-CM

## 2018-06-06 LAB — MAGNESIUM
Magnesium: 1.7 mg/dL (ref 1.7–2.4)
Magnesium: 1.9 mg/dL (ref 1.7–2.4)
Magnesium: 1.8 mg/dL (ref 1.7–2.4)

## 2018-06-06 LAB — CULTURE, RESPIRATORY W GRAM STAIN

## 2018-06-06 LAB — GLUCOSE, CAPILLARY
GLUCOSE-CAPILLARY: 127 mg/dL — AB (ref 70–99)
GLUCOSE-CAPILLARY: 127 mg/dL — AB (ref 70–99)
Glucose-Capillary: 117 mg/dL — ABNORMAL HIGH (ref 70–99)
Glucose-Capillary: 115 mg/dL — ABNORMAL HIGH (ref 70–99)
Glucose-Capillary: 98 mg/dL (ref 70–99)
Glucose-Capillary: 117 mg/dL — ABNORMAL HIGH (ref 70–99)

## 2018-06-06 LAB — BASIC METABOLIC PANEL
Anion gap: 7 (ref 5–15)
BUN: 9 mg/dL (ref 8–23)
CO2: 34 mmol/L — ABNORMAL HIGH (ref 22–32)
Calcium: 9.2 mg/dL (ref 8.9–10.3)
Chloride: 88 mmol/L — ABNORMAL LOW (ref 98–111)
Creatinine, Ser: 0.71 mg/dL (ref 0.44–1.00)
GFR calc Af Amer: >60 mL/min (ref >60)
Glucose, Bld: 120 mg/dL — ABNORMAL HIGH (ref 70–99)
Potassium: 3.1 mmol/L — ABNORMAL LOW (ref 3.5–5.1)
SODIUM: 129 mmol/L — AB (ref 135–145)

## 2018-06-06 LAB — CBC
HCT: 44.4 % (ref 36.0–46.0)
Hemoglobin: 14.4 g/dL (ref 12.0–15.0)
MCH: 30.9 pg (ref 26.0–34.0)
MCHC: 32.4 g/dL (ref 30.0–36.0)
MCV: 95.3 fL (ref 80.0–100.0)
Platelets: 162 10*3/uL (ref 150–400)
RBC: 4.66 MIL/uL (ref 3.87–5.11)
RDW: 12.8 % (ref 11.5–15.5)
WBC: 14.7 10*3/uL — ABNORMAL HIGH (ref 4.0–10.5)
nRBC: 0 % (ref 0.0–0.2)

## 2018-06-06 LAB — POCT I-STAT 3, ART BLOOD GAS (G3+)
Acid-Base Excess: 9 mmol/L — ABNORMAL HIGH (ref 0.0–2.0)
Bicarbonate: 36.2 mmol/L — ABNORMAL HIGH (ref 20.0–28.0)
O2 Saturation: 90 %
PH ART: 7.414 (ref 7.350–7.450)
Patient temperature: 99.5
TCO2: 38 mmol/L — ABNORMAL HIGH (ref 22–32)
pCO2 arterial: 56.9 mmHg — ABNORMAL HIGH (ref 32.0–48.0)
pO2, Arterial: 62 mmHg — ABNORMAL LOW (ref 83.0–108.0)

## 2018-06-06 LAB — PROCALCITONIN: PROCALCITONIN: 0.54 ng/mL

## 2018-06-06 LAB — PHOSPHORUS
Phosphorus: 2.3 mg/dL — ABNORMAL LOW (ref 2.5–4.6)
Phosphorus: 3.2 mg/dL (ref 2.5–4.6)
Phosphorus: 3.1 mg/dL (ref 2.5–4.6)

## 2018-06-06 LAB — CULTURE, RESPIRATORY

## 2018-06-06 MED ORDER — POTASSIUM CHLORIDE 10 MEQ/100ML IV SOLN
10.0000 meq | INTRAVENOUS | Status: AC
  Administered 2018-06-06 (×4): 10 meq via INTRAVENOUS
  Filled 2018-06-06 (×4): qty 100

## 2018-06-06 MED ORDER — SODIUM PHOSPHATES 45 MMOLE/15ML IV SOLN
10.0000 mmol | Freq: Once | INTRAVENOUS | Status: AC
  Administered 2018-06-06: 10 mmol via INTRAVENOUS
  Filled 2018-06-06: qty 3.33

## 2018-06-06 MED ORDER — POTASSIUM CHLORIDE 20 MEQ/15ML (10%) PO SOLN
20.0000 meq | ORAL | Status: AC
  Administered 2018-06-06 (×2): 20 meq via ORAL
  Filled 2018-06-06 (×2): qty 15

## 2018-06-06 MED ORDER — MAGNESIUM SULFATE 2 GM/50ML IV SOLN
2.0000 g | Freq: Once | INTRAVENOUS | Status: AC
  Administered 2018-06-06: 2 g via INTRAVENOUS
  Filled 2018-06-06: qty 50

## 2018-06-06 MED ORDER — FUROSEMIDE 10 MG/ML IJ SOLN
40.0000 mg | Freq: Four times a day (QID) | INTRAMUSCULAR | Status: DC
  Administered 2018-06-06: 40 mg via INTRAVENOUS
  Filled 2018-06-06: qty 4

## 2018-06-06 MED ORDER — FUROSEMIDE 10 MG/ML IJ SOLN
20.0000 mg | Freq: Four times a day (QID) | INTRAMUSCULAR | Status: AC
  Administered 2018-06-06 (×2): 20 mg via INTRAVENOUS
  Filled 2018-06-06 (×2): qty 2

## 2018-06-06 MED ORDER — VITAL HIGH PROTEIN PO LIQD
1000.0000 mL | ORAL | Status: DC
  Administered 2018-06-06 – 2018-06-07 (×2): 1000 mL

## 2018-06-06 MED ORDER — ACETAZOLAMIDE SODIUM 500 MG IJ SOLR
250.0000 mg | Freq: Four times a day (QID) | INTRAMUSCULAR | Status: AC
  Administered 2018-06-06 – 2018-06-07 (×3): 250 mg via INTRAVENOUS
  Filled 2018-06-06 (×3): qty 250
  Filled 2018-06-06: qty 750
  Filled 2018-06-06: qty 250

## 2018-06-06 MED ORDER — STERILE WATER FOR INJECTION IJ SOLN
INTRAMUSCULAR | Status: AC
  Administered 2018-06-06: 10 mL
  Filled 2018-06-06: qty 10

## 2018-06-06 MED ORDER — POTASSIUM CHLORIDE 20 MEQ/15ML (10%) PO SOLN
40.0000 meq | Freq: Three times a day (TID) | ORAL | Status: AC
  Administered 2018-06-06 (×2): 40 meq
  Filled 2018-06-06 (×2): qty 30

## 2018-06-06 MED ORDER — STERILE WATER FOR INJECTION IJ SOLN
INTRAMUSCULAR | Status: AC
  Administered 2018-06-06: 11:00:00
  Filled 2018-06-06: qty 10

## 2018-06-06 NOTE — Progress Notes (Signed)
Initial Nutrition Assessment  DOCUMENTATION CODES:   Not applicable  INTERVENTION:   Tube Feeding:  Vital High Protein @ 65 ml/hr Provides 1560 kcals, 137 g of protein and 1310 mL of free water Meets 100% estimated protein and calorie needs   NUTRITION DIAGNOSIS:   Inadequate oral intake related to acute illness as evidenced by NPO status.  GOAL:   Patient will meet greater than or equal to 90% of their needs  MONITOR:   TF tolerance, Vent status, Labs  REASON FOR ASSESSMENT:   Ventilator    ASSESSMENT:    63 yo female admitted 06/04/18 post cardiac arrest, intubated s/p rosc with mixed acidosis, also found to have multifocal pneumonia and COPD exacerbation. PMH includes COPD   Patient is currently intubated on ventilator support, fentanyl for sedation MV: 6.6 L/min Temp (24hrs), Avg:99.2 F (37.3 C), Min:98.1 F (36.7 C), Max:99.9 F (37.7 C)  No family at bedside, unable to obtain diet and weight history  OG tube in place  Weight trending down since admission; current wt 73.4 kg; admission wt 81.7 kg. Net negative 2L per I/O flow sheet  Labs: sodium 129 (L), potassium 3.1 (L), phosphorus 2.3 (L), Creatinine wdl Meds: folic acid, lasix, ss novolog, solumedrol, thiamine    NUTRITION - FOCUSED PHYSICAL EXAM:  Assess on follow-up  Diet Order:   Diet Order            Diet NPO time specified  Diet effective now              EDUCATION NEEDS:   Not appropriate for education at this time  Skin:  Skin Assessment: Reviewed RN Assessment  Last BM:  1/14  Height:   Ht Readings from Last 1 Encounters:  06/04/18 5\' 5"  (1.651 m)    Weight:   Wt Readings from Last 1 Encounters:  06/06/18 73.4 kg    Ideal Body Weight:     BMI:  Body mass index is 26.93 kg/m.  Estimated Nutritional Needs:   Kcal:  3338 kcals   Protein:  110-145 g  Fluid:  >/= 1.5 L   Kerman Passey MS, RD, LDN, CNSC [phone] Pager  [phone] Weekend/On-Call  Pager

## 2018-06-06 NOTE — Progress Notes (Addendum)
NAME:  Kim [name], MRN:  [id number], DOB:  December 08, 1955, LOS: 2 ADMISSION DATE:  06/04/2018 REFERRING MD: Emergency department physician, CHIEF COMPLAINT: Status post cardiac arrest  Brief History   7 female admitted 06/04/2018 post cardiac arrest, CCM consulted for intubation s/p rosc, found to have multifocal pneumonia and COPD exacerbation.   Past Medical History  COPD  Significant Hospital Events   06/04/2018 admitted for presumed cardiac arrest  Consults:  none  Procedures:  06/04/2018 intubation 06/04/2018 left IJ CVL placed  Significant Diagnostic Tests:  06/04/2018 CT of the head 06/04/2018 EKG 06/04/2018 EEG 06/04/2018 2D echo  Micro Data:  06/04/2018 blood cultures x2 06/04/2018 sputum culture 06/04/2018 urine culture  Antimicrobials:  06/04/2018 vancomycin 06/04/2018 cefepime  Interim history/subjective:  Following commands, alert and writing requests.   Objective   Blood pressure 120/75, pulse 70, temperature 99.3 F (37.4 C), resp. rate (!) 23, height 5\' 5"  (1.651 m), weight 73.4 kg, SpO2 93 %. CVP:  [4 mmHg-16 mmHg] 16 mmHg  Vent Mode: PRVC FiO2 (%):  [40 %-60 %] 50 % Set Rate:  [26 bmp] 26 bmp Vt Set:  [450 mL] 450 mL PEEP:  [5 cmH20] 5 cmH20 Pressure Support:  [5 cmH20] 5 cmH20 Plateau Pressure:  [17 cmH20-19 cmH20] 17 cmH20   Intake/Output Summary (Last 24 hours) at 06/06/2018 0645 Last data filed at 06/06/2018 0600 Gross per 24 hour  Intake 678.75 ml  Output 3775 ml  Net -3096.25 ml   Filed Weights   06/04/18 1443 06/05/18 0500 06/06/18 0420  Weight: 81.7 kg 78.2 kg 73.4 kg    Examination: General: Following commands, writing, alert HENT: ETT tube in place, [place], AT Lungs: CTA, no wheezing or rhonchi  Cardiovascular: RRR, no m/r/g Abdomen: +BS, soft, NTTP, non-distended Extremities: Warm and dry, trace edema  Neuro: opening eyes, following commands, able to lift arms GU: Foley in place  Resolved Hospital Problem list   Mixed metabolic  acidosis  Altered mental status   Assessment & Plan:  Vent dependent respiratory failure secondary to presumed cardiac arrest.  She was outside with friends noted to become unresponsive, complained of shortness of breath.  EMS found her asystole no shock via received 2 epi CPR return of spontaneous circulation within 5 minutes.  Mixed Respiratory Metabolic Acidosis Requiring Intubation Acute COPD Exacerbation H. Influenzae Multifocal Pneumonia/Enterovirus/Rhinovirus Resp culture growing moderate gram negative rods, RVP+ rhinovirus/enterovirus. Purulent sputum. Pulmonary edema improved on cxr and PE but worsening RLL consolidation. 3L off overnight. ABG showing compensated chronic respiratory acidosis. -WUA/SBT daily   -repeat ABG -VAP -cont. Diuresis  -duonebs q6h, cont. methylprednisolone -Cont. cefepime -blood, urine cultures pending - NGTD 2 days -resp culture sensitivities pending  -Follow chest x-ray - will need f/u CT 3-4 weeks once pneumonia resolved due to nodular opacities  Status post cardiac arrest 06/04/2018 Troponins peaked .16. Trending down. ECHO with EF 74-12%, grade 1 diastolic dysfunction. Sees Dr. Gwenlyn Found outpatient.  -f/u outpatient     Hyponatremia Continuing to improve with diuresis.  -cont. Lasix  -Continue to monitor -am labs  Hyperglycemia -Sliding scale insulin  Best practice:  Diet: N.p.o. Pain/Anxiety/Delirium protocol (if indicated): Fentanyl Versed VAP protocol (if indicated): Yes DVT prophylaxis: heparin GI prophylaxis: PPI Glucose control: Sliding scale insulin Mobility: Bedrest Code Status: Full Family Communication: 06/04/2018 no family at bedside Disposition: 06/04/2018 transfer to intensive care unit to heart  Labs   CBC: Recent Labs  Lab 06/04/18 0652 06/04/18 1051 06/05/18 0357 06/06/18 0421  WBC 22.8* 24.4* 17.2*  14.7*  HGB 17.2* 15.5* 13.6 14.4  HCT 53.8* 44.8 40.8 44.4  MCV 97.5 93.5 94.4 95.3  PLT 238 122* 153 162     Basic Metabolic Panel: Recent Labs  Lab 06/04/18 0652 06/04/18 1051 06/05/18 0357 06/06/18 0421  NA 117* 122* 126* 129*  K 4.4 4.4 4.3 3.1*  CL 79* 87* 88* 88*  CO2 20* 24 31 34*  GLUCOSE 228* 129* 88 120*  BUN 17 16 10 9   CREATININE 1.55* 0.80 0.76 0.71  CALCIUM 9.2 7.8* 8.6* 9.2  MG  --  1.3*  --  1.7  PHOS  --  3.4  --  2.3*   GFR: Estimated Creatinine Clearance: 72.3 mL/min (by C-G formula based on SCr of 0.71 mg/dL). Recent Labs  Lab 06/04/18 0652 06/04/18 0658 06/04/18 1051 06/05/18 0357 06/06/18 0421  PROCALCITON  --   --  0.36 0.81 0.54  WBC 22.8*  --  24.4* 17.2* 14.7*  LATICACIDVEN  --  8.70* 1.5  --   --     Liver Function Tests: Recent Labs  Lab 06/04/18 1051  AST 40  ALT 28  ALKPHOS 84  BILITOT 1.3*  PROT 4.8*  ALBUMIN 2.2*   Recent Labs  Lab 06/04/18 1051  LIPASE 18  AMYLASE 195*   No results for input(s): AMMONIA in the last 168 hours.  ABG    Component Value Date/Time   PHART 7.414 06/06/2018 0413   PCO2ART 56.9 (H) 06/06/2018 0413   PO2ART 62.0 (L) 06/06/2018 0413   HCO3 36.2 (H) 06/06/2018 0413   TCO2 38 (H) 06/06/2018 0413   O2SAT 90.0 06/06/2018 0413     Coagulation Profile: Recent Labs  Lab 06/04/18 0652 06/04/18 1142  INR 1.39 1.70    Cardiac Enzymes: Recent Labs  Lab 06/04/18 1051 06/04/18 1306 06/04/18 1950  TROPONINI 0.11* 0.16* 0.11*    HbA1C: Hgb A1c MFr Bld  Date/Time Value Ref Range Status  03/13/2018 11:56 AM 5.7 (H) 4.8 - 5.6 % Final    Comment:             Prediabetes: 5.7 - 6.4          Diabetes: >6.4          Glycemic control for adults with diabetes: <7.0     CBG: Recent Labs  Lab 06/05/18 1148 06/05/18 1514 06/05/18 1953 06/05/18 2338 06/06/18 0341  GLUCAP 88 117* 136* 142* 117*    Review of Systems:   Unable to obtain ROS due to intubation and sedation   Past Medical History  She,  has a past medical history of CHF (congestive heart failure) ([place]), COPD (chronic  obstructive pulmonary disease) ([place]), Diabetes mellitus without complication ([place]), GERD (gastroesophageal reflux disease), History of DVT (deep vein thrombosis), Hypercholesteremia, Hyperlipemia, Normal coronary arteries, Schizophrenia ([place]), and Tobacco abuse.   Surgical History    Past Surgical History:  Procedure Laterality Date  . LEFT HEART CATHETERIZATION WITH CORONARY ANGIOGRAM N/A 02/09/2014   Procedure: LEFT HEART CATHETERIZATION WITH CORONARY ANGIOGRAM;  Surgeon: Lorretta Harp, MD;  Location[facility] C[facility] LAB;  Service: Cardiovascular;  Laterality: N/A;  . none       Social History   reports that she has been smoking cigarettes. She has a 21.00 pack-year smoking history. She has never used smokeless tobacco. She reports that she does not drink alcohol or use drugs.   Family History   Her family history is not on file.   Allergies Allergies  Allergen Reactions  .  Asa [Aspirin] Other (See Comments)    Stomach pain  . Metformin And Related Other (See Comments)    STOMACH PROBLEMS  . Penicillins Nausea And Vomiting    Large doses  . Quetiapine Other (See Comments)    REACTION: nausea/dizzy  . Acetaminophen Nausea And Vomiting and Rash     Home Medications  Prior to Admission medications   Medication Sig Start Date End Date Taking? Authorizing Provider  albuterol (VENTOLIN HFA) 108 (90 Base) MCG/ACT inhaler Inhale 2 puffs into the lungs every 4 (four) hours as needed for wheezing or shortness of breath. 10/23/17   Rigoberto Noel, MD  aspirin 81 MG chewable tablet Chew 1 tablet (81 mg total) by mouth daily. 03/13/18   [name], FNP  azithromycin (ZITHROMAX) 250 MG tablet Take 1 tablet (250 mg total) by mouth daily. Take first 2 tablets together, then 1 every day until finished. 04/03/18   Hedges, Dellis Filbert, PA-C  benzonatate (TESSALON) 100 MG capsule Take 1 capsule (100 mg total) by mouth every 8 (eight) hours. 04/03/18   Hedges, Dellis Filbert, PA-C  cholecalciferol (VITAMIN D)  1000 units tablet Take 1,000 Units by mouth daily.    [provider]  Fluticasone-Salmeterol (ADVAIR) 250-50 MCG/DOSE AEPB Inhale 1 puff into the lungs 2 (two) times daily. 03/13/18   [name], FNP  furosemide (LASIX) 20 MG tablet TAKE 1 OR 2 TABLETS BY MOUTH AS NEEDED 03/13/18   [name], FNP  risperiDONE (RISPERDAL) 2 MG tablet Take 2 mg by mouth at bedtime.    [provider]  tiotropium (SPIRIVA) 18 MCG inhalation capsule Place 1 capsule (18 mcg total) into inhaler and inhale daily. 03/13/18   [name], FNP         Marty Heck, DO 06/06/2018, 6:45 AM Pager: [phone]  Attending Note:  63 year old female with PMH of COPD and CAD presenting with H flu pneumonia resulting in respiratory failure and cardiac arrest that is now much more alert and interactive on exam with clear lungs.  I reviewed CXR myself, RLL infiltrate noted, with ETT in good position.  Discussed with PCCM-NP.  Will begin weaning, continue active diureses.  Replace electrolytes as indicated.  PCCM will continue to manage in the ICU.  Continue cefepime for H flu, will await speciation.  The patient is critically ill with multiple organ systems failure and requires high complexity decision making for assessment and support, frequent evaluation and titration of therapies, application of advanced monitoring technologies and extensive interpretation of multiple databases.   Critical Care Time devoted to patient care services described in this note is  32  Minutes. This time reflects time of care of this signee Dr Jennet Maduro. This critical care time does not reflect procedure time, or teaching time or supervisory time of PA/NP/Med student/Med Resident etc but could involve care discussion time.  Rush Farmer, M.D. [facility] Pulmonary/Critical Care Medicine. Pager: [phone]. After hours pager: [phone].

## 2018-06-06 NOTE — Progress Notes (Signed)
[facility] ADULT ICU REPLACEMENT PROTOCOL FOR AM LAB REPLACEMENT ONLY  The patient does apply for the [facility] Adult ICU Electrolyte Replacment Protocol based on the criteria listed below:   1. Is GFR >/= 40 ml/min? No.  Patient's GFR today is >60 2. Is urine output >/= 0.5 ml/kg/hr for the last 6 hours? Yes.   Patient's UOP is 1.8 ml/kg/hr 3. Is BUN < 60 mg/dL? Yes.    Patient's BUN today is 9 4. Abnormal electrolyte(s): K3.1, phos2.3,Mg1.7 5. Ordered repletion with: per protocol 6. If a panic level lab has been reported, has the CCM MD in charge been notified? Yes.  .   Physician:  Lucile Shutters, MD  [place] 06/06/2018 5:51 AM

## 2018-06-06 NOTE — Progress Notes (Signed)
RN drew blood gas from central line- CCM resident came to room & reviewed results. Per MD, hold off on drawing ABG until she can discuss results w/ Dr Nelda Marseille.  RN aware.

## 2018-06-07 ENCOUNTER — Other Ambulatory Visit: Payer: Self-pay | Admitting: Nurse Practitioner

## 2018-06-07 ENCOUNTER — Inpatient Hospital Stay (HOSPITAL_COMMUNITY): Payer: Medicare HMO

## 2018-06-07 LAB — POCT I-STAT 3, ART BLOOD GAS (G3+)
Acid-Base Excess: 8 mmol/L — ABNORMAL HIGH (ref 0.0–2.0)
Acid-Base Excess: 8 mmol/L — ABNORMAL HIGH (ref 0.0–2.0)
Acid-Base Excess: 11 mmol/L — ABNORMAL HIGH (ref 0.0–2.0)
Acid-Base Excess: 12 mmol/L — ABNORMAL HIGH (ref 0.0–2.0)
Acid-Base Excess: 14 mmol/L — ABNORMAL HIGH (ref 0.0–2.0)
Bicarbonate: 35.5 mmol/L — ABNORMAL HIGH (ref 20.0–28.0)
Bicarbonate: 36 mmol/L — ABNORMAL HIGH (ref 20.0–28.0)
Bicarbonate: 41.1 mmol/L — ABNORMAL HIGH (ref 20.0–28.0)
Bicarbonate: 40.1 mmol/L — ABNORMAL HIGH (ref 20.0–28.0)
Bicarbonate: 43.5 mmol/L — ABNORMAL HIGH (ref 20.0–28.0)
O2 Saturation: 71 %
O2 Saturation: 94 %
O2 Saturation: 96 %
O2 Saturation: 98 %
O2 Saturation: 94 %
PH ART: 7.338 — AB (ref 7.350–7.450)
PH ART: 7.383 (ref 7.350–7.450)
Patient temperature: 99.1
Patient temperature: 98.6
Patient temperature: 37.2
Patient temperature: 37.6
TCO2: 37 mmol/L — ABNORMAL HIGH (ref 22–32)
TCO2: 38 mmol/L — ABNORMAL HIGH (ref 22–32)
TCO2: 43 mmol/L — ABNORMAL HIGH (ref 22–32)
TCO2: 42 mmol/L — ABNORMAL HIGH (ref 22–32)
TCO2: 46 mmol/L — ABNORMAL HIGH (ref 22–32)
pCO2 arterial: 59.8 mmHg — ABNORMAL HIGH (ref 32.0–48.0)
pCO2 arterial: 62.9 mmHg — ABNORMAL HIGH (ref 32.0–48.0)
pCO2 arterial: 76.5 mmHg (ref 32.0–48.0)
pCO2 arterial: 62.1 mmHg — ABNORMAL HIGH (ref 32.0–48.0)
pCO2 arterial: 73.4 mmHg (ref 32.0–48.0)
pH, Arterial: 7.382 (ref 7.350–7.450)
pH, Arterial: 7.367 (ref 7.350–7.450)
pH, Arterial: 7.419 (ref 7.350–7.450)
pO2, Arterial: 39 mmHg — CL (ref 83.0–108.0)
pO2, Arterial: 79 mmHg — ABNORMAL LOW (ref 83.0–108.0)
pO2, Arterial: 95 mmHg (ref 83.0–108.0)
pO2, Arterial: 119 mmHg — ABNORMAL HIGH (ref 83.0–108.0)
pO2, Arterial: 78 mmHg — ABNORMAL LOW (ref 83.0–108.0)

## 2018-06-07 LAB — CBC
HCT: 42 % (ref 36.0–46.0)
Hemoglobin: 13.7 g/dL (ref 12.0–15.0)
MCH: 31.7 pg (ref 26.0–34.0)
MCHC: 32.6 g/dL (ref 30.0–36.0)
MCV: 97.2 fL (ref 80.0–100.0)
Platelets: 175 10*3/uL (ref 150–400)
RBC: 4.32 MIL/uL (ref 3.87–5.11)
RDW: 13 % (ref 11.5–15.5)
WBC: 19.6 10*3/uL — AB (ref 4.0–10.5)
nRBC: 0 % (ref 0.0–0.2)

## 2018-06-07 LAB — POCT I-STAT 3, VENOUS BLOOD GAS (G3P V)
Acid-Base Excess: 8 mmol/L — ABNORMAL HIGH (ref 0.0–2.0)
Bicarbonate: 35.5 mmol/L — ABNORMAL HIGH (ref 20.0–28.0)
O2 SAT: 71 %
TCO2: 37 mmol/L — ABNORMAL HIGH (ref 22–32)
pCO2, Ven: 59.8 mmHg (ref 44.0–60.0)
pH, Ven: 7.382 (ref 7.250–7.430)
pO2, Ven: 39 mmHg (ref 32.0–45.0)

## 2018-06-07 LAB — BASIC METABOLIC PANEL
Anion gap: 4 — ABNORMAL LOW (ref 5–15)
BUN: 14 mg/dL (ref 8–23)
CO2: 35 mmol/L — ABNORMAL HIGH (ref 22–32)
Calcium: 9.3 mg/dL (ref 8.9–10.3)
Chloride: 92 mmol/L — ABNORMAL LOW (ref 98–111)
Creatinine, Ser: 0.68 mg/dL (ref 0.44–1.00)
GFR calc Af Amer: >60 mL/min (ref >60)
GFR calc non Af Amer: >60 mL/min (ref >60)
Glucose, Bld: 126 mg/dL — ABNORMAL HIGH (ref 70–99)
POTASSIUM: 4.1 mmol/L (ref 3.5–5.1)
Sodium: 131 mmol/L — ABNORMAL LOW (ref 135–145)

## 2018-06-07 LAB — GLUCOSE, CAPILLARY
GLUCOSE-CAPILLARY: 124 mg/dL — AB (ref 70–99)
Glucose-Capillary: 112 mg/dL — ABNORMAL HIGH (ref 70–99)
Glucose-Capillary: 122 mg/dL — ABNORMAL HIGH (ref 70–99)
Glucose-Capillary: 134 mg/dL — ABNORMAL HIGH (ref 70–99)
Glucose-Capillary: 94 mg/dL (ref 70–99)
Glucose-Capillary: 96 mg/dL (ref 70–99)
Glucose-Capillary: 98 mg/dL (ref 70–99)

## 2018-06-07 LAB — MAGNESIUM
Magnesium: 1.8 mg/dL (ref 1.7–2.4)
Magnesium: 1.5 mg/dL — ABNORMAL LOW (ref 1.7–2.4)

## 2018-06-07 LAB — PHOSPHORUS
PHOSPHORUS: 4.5 mg/dL (ref 2.5–4.6)
Phosphorus: 2.6 mg/dL (ref 2.5–4.6)

## 2018-06-07 MED ORDER — FUROSEMIDE 10 MG/ML IJ SOLN
40.0000 mg | Freq: Four times a day (QID) | INTRAMUSCULAR | Status: AC
  Administered 2018-06-07 (×3): 40 mg via INTRAVENOUS
  Filled 2018-06-07 (×3): qty 4

## 2018-06-07 MED ORDER — SODIUM CHLORIDE 0.9 % IV SOLN
2.0000 g | INTRAVENOUS | Status: AC
  Administered 2018-06-08 – 2018-06-10 (×3): 2 g via INTRAVENOUS
  Filled 2018-06-07 (×3): qty 20

## 2018-06-07 MED ORDER — METOLAZONE 5 MG PO TABS
5.0000 mg | ORAL_TABLET | Freq: Every day | ORAL | Status: AC
  Administered 2018-06-07: 5 mg via ORAL
  Filled 2018-06-07: qty 1

## 2018-06-07 MED ORDER — CHLORHEXIDINE GLUCONATE 0.12 % MT SOLN
15.0000 mL | Freq: Two times a day (BID) | OROMUCOSAL | Status: DC
  Administered 2018-06-07 – 2018-06-12 (×10): 15 mL via OROMUCOSAL
  Filled 2018-06-07 (×7): qty 15

## 2018-06-07 MED ORDER — ORAL CARE MOUTH RINSE
15.0000 mL | Freq: Two times a day (BID) | OROMUCOSAL | Status: DC
  Administered 2018-06-07 – 2018-06-11 (×6): 15 mL via OROMUCOSAL

## 2018-06-07 MED ORDER — FENTANYL CITRATE (PF) 100 MCG/2ML IJ SOLN: 100.0000 ug | Freq: Once | INTRAMUSCULAR | Status: DC

## 2018-06-07 MED ORDER — SODIUM CHLORIDE 0.9 % IV SOLN
2.0000 g | INTRAVENOUS | Status: DC
  Administered 2018-06-07: 2 g via INTRAVENOUS
  Filled 2018-06-07 (×2): qty 20

## 2018-06-07 MED ORDER — MIDAZOLAM HCL 2 MG/2ML IJ SOLN: 2.0000 mg | Freq: Once | INTRAMUSCULAR | Status: DC

## 2018-06-07 MED ORDER — ACETAZOLAMIDE SODIUM 500 MG IJ SOLR
250.0000 mg | Freq: Four times a day (QID) | INTRAMUSCULAR | Status: AC
  Administered 2018-06-07 (×3): 250 mg via INTRAVENOUS
  Filled 2018-06-07 (×3): qty 250

## 2018-06-07 NOTE — Plan of Care (Signed)
PCCM Plan of Care Note I evaluated Ms. Ector at bedside.  She is a 63 year old female with history significant for COPD who was admitted on 114 post cardiac arrest.  She was intubated and treated for possible pneumonia and COPD exacerbation.  She had good neurological outcome and did not require targeted temperature management.  She was extubated this morning.  Evaluated at bedside for concern for increased lethargy.  Nurse at bedside does state that patient was mostly awake all of last night.  On my evaluation patient is laying quietly in bed.  She has BiPAP on.  She is somnolent but easily arouses and answers all questions appropriately.  She also obeys and follows complex commands. She is oriented to time person and place. ABG done at 4 PM showed chronic respiratory acidosis with pH of 7.41 PCO2 62 and PO2 of 119.  She is on BiPAP with PEEP of 6, FiO2 of 40% and inspiratory pressure of 6.  She is breathing about 20-25 times a minute.  Chest is clear to auscultation.  Plan -We will repeat ABG to ensure that patient is not retaining CO2.  Patient however is very easily arousable and obeys all commands thus doubtful that she has significant CO2 retention. -Continue BiPAP overnight   Oswald Hillock, M.D. [facility] Pulmonary/Critical Care Medicine. Pager:  After hours pager: [phone].

## 2018-06-07 NOTE — Progress Notes (Signed)
Placed patient on BiPAP at this time per MD order. 12/6 and 40%. Patient lethargic, but arousable. Tolerating BiPAP well. Will continue to monitor.

## 2018-06-07 NOTE — Evaluation (Signed)
Physical Therapy Evaluation Patient Details Name: Kim [name] MRN: [id number] DOB: [birth year] Today's Date: 06/07/2018   History of Present Illness  28 female admitted 06/04/2018 post cardiac arrest, CCM consulted for intubation s/p rosc, found to have multifocal pneumonia and COPD exacerbation. Extubated 1/17 in am.    Clinical Impression  Pt admitted with above diagnosis. Pt currently with functional limitations due to the deficits listed below (see PT Problem List). Pt was unable to participate more than UE and LE exercises as she was 90% on 5LO2 at rest and would desat to 87% with little UE and LE exercises.  Decided to not get pt to EOB due to desat with just exercises.  May need SNF if she continues to have slow progress. Will follow acutely.   Pt will benefit from skilled PT to increase their independence and safety with mobility to allow discharge to the venue listed below.      Follow Up Recommendations SNF;Supervision/Assistance - 24 hour    Equipment Recommendations  None recommended by PT    Recommendations for Other Services       Precautions / Restrictions Precautions Precautions: Fall Restrictions Weight Bearing Restrictions: No      Mobility  Bed Mobility Overal bed mobility: Needs Assistance Bed Mobility: Rolling Rolling: Min assist         General bed mobility comments: Did not sit up as pt desat with UE and LE movement.   Transfers                    Ambulation/Gait                Stairs            Wheelchair Mobility    Modified Rankin (Stroke Patients Only)       Balance                                             Pertinent Vitals/Pain Pain Assessment: No/denies pain    Home Living Family/patient expects to be discharged to:: Shelter/Homeless                      Prior Function Level of Independence: Independent               Hand Dominance        Extremity/Trunk  Assessment   Upper Extremity Assessment Upper Extremity Assessment: Defer to OT evaluation    Lower Extremity Assessment Lower Extremity Assessment: Generalized weakness       Communication   Communication: No difficulties  Cognition Arousal/Alertness: Lethargic Behavior During Therapy: Flat affect Overall Cognitive Status: Difficult to assess                                        General Comments General comments (skin integrity, edema, etc.): Pt on 5LO2 with sats 90% at rest.  With any movement of extermities, O2 drops to 87-89%.  BP 104/70, HR 74 bpm, RR at rest 27 and with extremity  movement incr to 40.     Exercises General Exercises - Upper Extremity Shoulder Flexion: AAROM;Both;5 reps;Supine Elbow Flexion: AAROM;Both;5 reps;Supine General Exercises - Lower Extremity Ankle Circles/Pumps: AROM;Both;10 reps;Supine Heel Slides: AROM;10 reps;Supine;5 reps   Assessment/Plan    PT  Assessment Patient needs continued PT services  PT Problem List Decreased balance;Decreased mobility;Decreased strength;Decreased knowledge of use of DME;Decreased safety awareness;Decreased knowledge of precautions;Cardiopulmonary status limiting activity       PT Treatment Interventions DME instruction;Gait training;Functional mobility training;Therapeutic activities;Therapeutic exercise;Balance training;Patient/family education    PT Goals (Current goals can be found in the Care Plan section)  Acute Rehab PT Goals Patient Stated Goal: unable to state PT Goal Formulation: Patient unable to participate in goal setting Time For Goal Achievement: 06/21/18 Potential to Achieve Goals: Fair    Frequency Min 2X/week   Barriers to discharge Decreased caregiver support(pt is homeless per chart)      Co-evaluation               AM-PAC PT "6 Clicks" Mobility  Outcome Measure Help needed turning from your back to your side while in a flat bed without using bedrails?: A  Lot Help needed moving from lying on your back to sitting on the side of a flat bed without using bedrails?: Total Help needed moving to and from a bed to a chair (including a wheelchair)?: Total Help needed standing up from a chair using your arms (e.g., wheelchair or bedside chair)?: Total Help needed to walk in hospital room?: Total Help needed climbing 3-5 steps with a railing? : Total 6 Click Score: 7    End of Session Equipment Utilized During Treatment: Oxygen(5LO2) Activity Tolerance: Patient limited by fatigue Patient left: in bed;with call bell/phone within reach;with bed alarm set Nurse Communication: Mobility status(pt desat to 87% with UE and LE exercise only, lethargic) PT Visit Diagnosis: Muscle weakness (generalized) (M62.81)    Time: 9629-5284 PT Time Calculation (min) (ACUTE ONLY): 18 min   Charges:   PT Evaluation $PT Eval Moderate Complexity: [place] Pager:  [phone]  Office:  [place] 06/07/2018, 4:15 PM

## 2018-06-07 NOTE — Procedures (Signed)
Extubation Procedure Note  Patient Details:   Name: Kim [name] DOB: 05-31-1955 MRN: [id number]   Airway Documentation:    Vent end date: 06/07/18 Vent end time: 1130   Evaluation  O2 sats: stable throughout Complications: No apparent complications Patient did tolerate procedure well. Bilateral Breath Sounds: Rhonchi   Yes   Patient extubated to [place]. RT had to place NRB due to sats in the 70's. Patient's vitals stable at this time. RN at bedside. RT will continue to monitor.  Mcneil Sober 06/07/2018, 11:48 AM

## 2018-06-07 NOTE — Progress Notes (Signed)
Pt states her PCP is [name] FNP [phone].  Called FNP's office to obtain home medications.  Spoke with FNP Laurance Flatten who states she will fax home medication list from last visit.  FNP states pt is noncompliant and missed last appointment in October.

## 2018-06-07 NOTE — Progress Notes (Signed)
Pharmacy is unable to confirm the medications the patient was taking prior to admission. All options have been exhausted and a resolution to the situation is not expected.   Where possible, their outpatient pharmacy(s) have been contacted for the last time prescriptions were filled and that information has been added to each medication in an Order Note (highlighted yellow below the medication).  Please contact pharmacy if further assistance is needed.   Romeo Rabon, PharmD. Mobile: [phone]. 06/07/2018,10:37 AM.

## 2018-06-07 NOTE — Progress Notes (Addendum)
NAME:  Kim [name], MRN:  [id number], DOB:  [birth year], LOS: 3 ADMISSION DATE:  06/04/2018 REFERRING MD: Emergency department physician, CHIEF COMPLAINT: Status post cardiac arrest  Brief History   61 female admitted 06/04/2018 post cardiac arrest, CCM consulted for intubation s/p rosc, found to have multifocal pneumonia and COPD exacerbation.   Past Medical History  COPD  Significant Hospital Events   06/04/2018 admitted for presumed cardiac arrest  Consults:  none  Procedures:  06/04/2018 intubation 06/04/2018 left IJ CVL placed  Significant Diagnostic Tests:  06/04/2018 CT of the head 06/04/2018 EKG 06/04/2018 EEG 06/04/2018 2D echo  Micro Data:  06/04/2018 blood cultures x2 06/04/2018 sputum culture 06/04/2018 urine culture  Antimicrobials:  06/04/2018 vancomycin >>  06/04/2018 cefepime >> 1/17 06/07/2018 ceftriaxone >>   Interim history/subjective:  Following commands and moving extremities. Sedation weaned down.   Objective   Blood pressure 108/74, pulse 60, temperature 99.5 F (37.5 C), resp. rate (!) 26, height 5\' 5"  (1.651 m), weight 73.7 kg, SpO2 98 %.    Vent Mode: PRVC FiO2 (%):  [50 %] 50 % Set Rate:  [25 bmp-26 bmp] 25 bmp Vt Set:  [450 mL] 450 mL PEEP:  [5 cmH20] 5 cmH20 Pressure Support:  [5 cmH20] 5 cmH20 Plateau Pressure:  [14 cmH20-18 cmH20] 17 cmH20   Intake/Output Summary (Last 24 hours) at 06/07/2018 0618 Last data filed at 06/07/2018 0600 Gross per 24 hour  Intake 2008.87 ml  Output 2405 ml  Net -396.13 ml   Filed Weights   06/05/18 0500 06/06/18 0420 06/07/18 0300  Weight: 78.2 kg 73.4 kg 73.7 kg    Examination: General: Following commands, writing, alert, moving all extremities  HENT: ETT tube in place, [place], AT Lungs: course breath sounds, left rhonchi, no wheezing or crackles Cardiovascular: RRR, no m/r/g Abdomen: +BS, soft, NTTP, non-distended Extremities: Warm and dry, trace edema  Neuro: opening eyes, following commands, able to  lift arms GU: Foley in place  Resolved Hospital Problem list   Mixed metabolic acidosis  Altered mental status   Assessment & Plan:  Vent dependent respiratory failure secondary to presumed cardiac arrest.  She was outside with friends noted to become unresponsive, complained of shortness of breath.  EMS found her asystole no shock via received 2 epi CPR return of spontaneous circulation within 5 minutes.  Hypercarbic Respiratory Failure Requiring Intubation  Pulmonary Edema  H. Influenzae Multifocal Pneumonia/Enterovirus/Rhinovirus on Ventilator H. Influenza, culture does not grow sensitivities, RVP+ rhinovirus/enterovirus. Leukocytosis slightly increased. Decreased lasix yesterday 2/2 soft BP. Net negative 400 last 24 hours. Decreased FiO2 to 40% this am. CXR similar to yesterday  -WUA/SBT daily   -VAP -cont. Lasix 40mg  tid, acetazolamide  -duonebs q6h -Cefepime switched to ceftriaxone for better coverage  -blood, urine cultures pending - NGTD 3 days -Follow chest x-ray - will need f/u CT 3-4 weeks once pneumonia resolved due to nodular opacities  Status post cardiac arrest 06/04/2018 Troponins peaked .16. Trending down. ECHO with EF 63-87%, grade 1 diastolic dysfunction. Sees Dr. Gwenlyn Found outpatient.  -f/u outpatient     Hyponatremia Continuing to improve with diuresis.  -Continue to monitor -am labs  Hyperglycemia -Sliding scale insulin  Best practice:  Diet: N.p.o. Pain/Anxiety/Delirium protocol (if indicated): Fentanyl Versed VAP protocol (if indicated): Yes DVT prophylaxis: heparin GI prophylaxis: PPI Glucose control: Sliding scale insulin Mobility: Bedrest Code Status: Full Family Communication: 06/04/2018 no family at bedside Disposition: 06/04/2018 transfer to intensive care unit to heart  Labs   CBC: Recent Labs  Lab 06/04/18 0652 06/04/18 1051 06/05/18 0357 06/06/18 0421 06/07/18 0300  WBC 22.8* 24.4* 17.2* 14.7* 19.6*  HGB 17.2* 15.5* 13.6 14.4 13.7    HCT 53.8* 44.8 40.8 44.4 42.0  MCV 97.5 93.5 94.4 95.3 97.2  PLT 238 122* 153 162 161    Basic Metabolic Panel: Recent Labs  Lab 06/04/18 0652 06/04/18 1051 06/05/18 0357 06/06/18 0421 06/06/18 1358 06/06/18 1700 06/07/18 0300  NA 117* 122* 126* 129*  --   --  131*  K 4.4 4.4 4.3 3.1*  --   --  4.1  CL 79* 87* 88* 88*  --   --  92*  CO2 20* 24 31 34*  --   --  35*  GLUCOSE 228* 129* 88 120*  --   --  126*  BUN 17 16 10 9   --   --  14  CREATININE 1.55* 0.80 0.76 0.71  --   --  0.68  CALCIUM 9.2 7.8* 8.6* 9.2  --   --  9.3  MG  --  1.3*  --  1.7 1.9 1.8 1.8  PHOS  --  3.4  --  2.3* 3.2 3.1 2.6   GFR: Estimated Creatinine Clearance: 72.4 mL/min (by C-G formula based on SCr of 0.68 mg/dL). Recent Labs  Lab 06/04/18 0658 06/04/18 1051 06/05/18 0357 06/06/18 0421 06/07/18 0300  PROCALCITON  --  0.36 0.81 0.54  --   WBC  --  24.4* 17.2* 14.7* 19.6*  LATICACIDVEN 8.70* 1.5  --   --   --     Liver Function Tests: Recent Labs  Lab 06/04/18 1051  AST 40  ALT 28  ALKPHOS 84  BILITOT 1.3*  PROT 4.8*  ALBUMIN 2.2*   Recent Labs  Lab 06/04/18 1051  LIPASE 18  AMYLASE 195*   No results for input(s): AMMONIA in the last 168 hours.  ABG    Component Value Date/Time   PHART 7.367 06/07/2018 0425   PCO2ART 62.9 (H) 06/07/2018 0425   PO2ART 79.0 (L) 06/07/2018 0425   HCO3 36.0 (H) 06/07/2018 0425   TCO2 38 (H) 06/07/2018 0425   O2SAT 94.0 06/07/2018 0425     Coagulation Profile: Recent Labs  Lab 06/04/18 0652 06/04/18 1142  INR 1.39 1.70    Cardiac Enzymes: Recent Labs  Lab 06/04/18 1051 06/04/18 1306 06/04/18 1950  TROPONINI 0.11* 0.16* 0.11*    HbA1C: Hgb A1c MFr Bld  Date/Time Value Ref Range Status  03/13/2018 11:56 AM 5.7 (H) 4.8 - 5.6 % Final    Comment:             Prediabetes: 5.7 - 6.4          Diabetes: >6.4          Glycemic control for adults with diabetes: <7.0     CBG: Recent Labs  Lab 06/06/18 1138 06/06/18 1626  06/06/18 1938 06/06/18 2313 06/07/18 0319  GLUCAP 127* 98 127* 117* 124*    Review of Systems:   Unable to obtain ROS due to intubation and sedation   Past Medical History  She,  has a past medical history of CHF (congestive heart failure) ([place]), COPD (chronic obstructive pulmonary disease) ([place]), Diabetes mellitus without complication ([place]), GERD (gastroesophageal reflux disease), History of DVT (deep vein thrombosis), Hypercholesteremia, Hyperlipemia, Normal coronary arteries, Schizophrenia ([place]), and Tobacco abuse.   Surgical History    Past Surgical History:  Procedure Laterality Date  . LEFT HEART CATHETERIZATION WITH CORONARY ANGIOGRAM N/A 02/09/2014  Procedure: LEFT HEART CATHETERIZATION WITH CORONARY ANGIOGRAM;  Surgeon: Lorretta Harp, MD;  Location: [facility] CATH LAB;  Service: Cardiovascular;  Laterality: N/A;  . none       Social History   reports that she has been smoking cigarettes. She has a 21.00 pack-year smoking history. She has never used smokeless tobacco. She reports that she does not drink alcohol or use drugs.   Family History   Her family history is not on file.   Allergies Allergies  Allergen Reactions  . Asa [Aspirin] Other (See Comments)    Stomach pain  . Metformin And Related Other (See Comments)    STOMACH PROBLEMS  . Penicillins Nausea And Vomiting    Large doses  . Quetiapine Other (See Comments)    REACTION: nausea/dizzy  . Acetaminophen Nausea And Vomiting and Rash     Home Medications  Prior to Admission medications   Medication Sig Start Date End Date Taking? Authorizing Provider  albuterol (VENTOLIN HFA) 108 (90 Base) MCG/ACT inhaler Inhale 2 puffs into the lungs every 4 (four) hours as needed for wheezing or shortness of breath. 10/23/17   Rigoberto Noel, MD  aspirin 81 MG chewable tablet Chew 1 tablet (81 mg total) by mouth daily. 03/13/18   [name], FNP  azithromycin (ZITHROMAX) 250 MG tablet Take 1 tablet (250 mg total) by mouth  daily. Take first 2 tablets together, then 1 every day until finished. 04/03/18   Hedges, Dellis Filbert, PA-C  benzonatate (TESSALON) 100 MG capsule Take 1 capsule (100 mg total) by mouth every 8 (eight) hours. 04/03/18   Hedges, Dellis Filbert, PA-C  cholecalciferol (VITAMIN D) 1000 units tablet Take 1,000 Units by mouth daily.    [provider]  Fluticasone-Salmeterol (ADVAIR) 250-50 MCG/DOSE AEPB Inhale 1 puff into the lungs 2 (two) times daily. 03/13/18   [name], FNP  furosemide (LASIX) 20 MG tablet TAKE 1 OR 2 TABLETS BY MOUTH AS NEEDED 03/13/18   [name], FNP  risperiDONE (RISPERDAL) 2 MG tablet Take 2 mg by mouth at bedtime.    [provider]  tiotropium (SPIRIVA) 18 MCG inhalation capsule Place 1 capsule (18 mcg total) into inhaler and inhale daily. 03/13/18   [name], FNP         Marty Heck, DO 06/07/2018, 6:18 AM Pager: [phone]  Attending Note:  63 year female with PMH of COPD and CAD who presents to PCCM post cardiac arrest who did not undergo hypothermia due to improved mental status.  Patient is now alert and interactive, moving all ext to command with coarse BS on exam.  I reviewed CXR myself, ETT is in a good position with pulmonary edema noted.  Discussed with resident.  Will add lasix 40 mg IV q6 x3 doses, zaroxolyn 5 mg PO x1 and acetazolamide 250 q6 x3 for diureses.  Replace electrolytes as indicated.  Begin PS trials with the hope to extubate today.  PCCM will continue to follow.  No need for steroids.  The patient is critically ill with multiple organ systems failure and requires high complexity decision making for assessment and support, frequent evaluation and titration of therapies, application of advanced monitoring technologies and extensive interpretation of multiple databases.   Critical Care Time devoted to patient care services described in this note is  32  Minutes. This time reflects time of care of this signee Dr Jennet Maduro.  This critical care time does not reflect procedure time, or teaching time or supervisory time of PA/NP/Med  student/Med Resident etc but could involve care discussion time.  Spero Gunnels G. Kaide Gage, M.D. [place] Pulmonary/Critical Care Medicine. Pager: [phone]. After hours pager: [phone]. 

## 2018-06-07 NOTE — Progress Notes (Signed)
Janece FNP called back and stated the medication listed in the pta meds are the same as what she has listed as pt taking.

## 2018-06-07 NOTE — Telephone Encounter (Signed)
Received call from [place] at [facility] requesting updated medication list, I confirmed with her the patient current outpatient list is up to date.

## 2018-06-08 ENCOUNTER — Other Ambulatory Visit: Payer: Self-pay

## 2018-06-08 ENCOUNTER — Encounter (HOSPITAL_COMMUNITY): Payer: Self-pay

## 2018-06-08 ENCOUNTER — Inpatient Hospital Stay (HOSPITAL_COMMUNITY): Payer: Medicare HMO

## 2018-06-08 DIAGNOSIS — J441 Chronic obstructive pulmonary disease with (acute) exacerbation: Secondary | ICD-10-CM

## 2018-06-08 DIAGNOSIS — F068 Other specified mental disorders due to known physiological condition: Secondary | ICD-10-CM | POA: Diagnosis not present

## 2018-06-08 LAB — CBC
HEMATOCRIT: 47.4 % — AB (ref 36.0–46.0)
Hemoglobin: 15 g/dL (ref 12.0–15.0)
MCH: 30.6 pg (ref 26.0–34.0)
MCHC: 31.6 g/dL (ref 30.0–36.0)
MCV: 96.7 fL (ref 80.0–100.0)
Platelets: 197 10*3/uL (ref 150–400)
RBC: 4.9 MIL/uL (ref 3.87–5.11)
RDW: 13 % (ref 11.5–15.5)
WBC: 17.2 10*3/uL — ABNORMAL HIGH (ref 4.0–10.5)
nRBC: 0 % (ref 0.0–0.2)

## 2018-06-08 LAB — MAGNESIUM: Magnesium: 1.5 mg/dL — ABNORMAL LOW (ref 1.7–2.4)

## 2018-06-08 LAB — LEGIONELLA PNEUMOPHILA SEROGP 1 UR AG: L. pneumophila Serogp 1 Ur Ag: NEGATIVE

## 2018-06-08 LAB — COMPREHENSIVE METABOLIC PANEL
ALT: 20 U/L (ref 0–44)
ANION GAP: 11 (ref 5–15)
AST: 13 U/L — ABNORMAL LOW (ref 15–41)
Albumin: 2.5 g/dL — ABNORMAL LOW (ref 3.5–5.0)
Alkaline Phosphatase: 79 U/L (ref 38–126)
BILIRUBIN TOTAL: 0.7 mg/dL (ref 0.3–1.2)
BUN: 15 mg/dL (ref 8–23)
CO2: 41 mmol/L — ABNORMAL HIGH (ref 22–32)
Calcium: 9.7 mg/dL (ref 8.9–10.3)
Chloride: 81 mmol/L — ABNORMAL LOW (ref 98–111)
Creatinine, Ser: 0.72 mg/dL (ref 0.44–1.00)
GFR calc Af Amer: >60 mL/min (ref >60)
GFR calc non Af Amer: >60 mL/min (ref >60)
Glucose, Bld: 105 mg/dL — ABNORMAL HIGH (ref 70–99)
Potassium: 3.2 mmol/L — ABNORMAL LOW (ref 3.5–5.1)
Sodium: 133 mmol/L — ABNORMAL LOW (ref 135–145)
Total Protein: 6.1 g/dL — ABNORMAL LOW (ref 6.5–8.1)

## 2018-06-08 LAB — GLUCOSE, CAPILLARY
GLUCOSE-CAPILLARY: 104 mg/dL — AB (ref 70–99)
Glucose-Capillary: 86 mg/dL (ref 70–99)
Glucose-Capillary: 91 mg/dL (ref 70–99)
Glucose-Capillary: 95 mg/dL (ref 70–99)
Glucose-Capillary: 95 mg/dL (ref 70–99)
Glucose-Capillary: 98 mg/dL (ref 70–99)

## 2018-06-08 LAB — POCT I-STAT 3, ART BLOOD GAS (G3+)
Acid-Base Excess: 16 mmol/L — ABNORMAL HIGH (ref 0.0–2.0)
Bicarbonate: 45.3 mmol/L — ABNORMAL HIGH (ref 20.0–28.0)
O2 Saturation: 93 %
PH ART: 7.414 (ref 7.350–7.450)
Patient temperature: 98.6
TCO2: 47 mmol/L — ABNORMAL HIGH (ref 22–32)
pCO2 arterial: 70.8 mmHg (ref 32.0–48.0)
pO2, Arterial: 69 mmHg — ABNORMAL LOW (ref 83.0–108.0)

## 2018-06-08 LAB — PHOSPHORUS: Phosphorus: 4.4 mg/dL (ref 2.5–4.6)

## 2018-06-08 MED ORDER — BUDESONIDE 0.5 MG/2ML IN SUSP
0.5000 mg | Freq: Two times a day (BID) | RESPIRATORY_TRACT | Status: DC
  Administered 2018-06-08 – 2018-06-10 (×5): 0.5 mg via RESPIRATORY_TRACT
  Filled 2018-06-08 (×4): qty 2

## 2018-06-08 MED ORDER — POTASSIUM CHLORIDE 10 MEQ/50ML IV SOLN
INTRAVENOUS | Status: AC
  Administered 2018-06-08: 10 meq via INTRAVENOUS
  Filled 2018-06-08: qty 50

## 2018-06-08 MED ORDER — POTASSIUM CHLORIDE 10 MEQ/50ML IV SOLN
10.0000 meq | INTRAVENOUS | Status: AC
  Administered 2018-06-08 (×4): 10 meq via INTRAVENOUS
  Filled 2018-06-08 (×3): qty 50

## 2018-06-08 MED ORDER — MAGNESIUM SULFATE 4 GM/100ML IV SOLN
4.0000 g | Freq: Once | INTRAVENOUS | Status: AC
  Administered 2018-06-08: 4 g via INTRAVENOUS
  Filled 2018-06-08: qty 100

## 2018-06-08 MED ORDER — ARFORMOTEROL TARTRATE 15 MCG/2ML IN NEBU
15.0000 ug | INHALATION_SOLUTION | Freq: Two times a day (BID) | RESPIRATORY_TRACT | Status: DC
  Administered 2018-06-08 – 2018-06-10 (×5): 15 ug via RESPIRATORY_TRACT
  Filled 2018-06-08 (×4): qty 2

## 2018-06-08 MED ORDER — BISACODYL 10 MG RE SUPP
10.0000 mg | Freq: Every day | RECTAL | Status: DC | PRN
  Administered 2018-06-08: 10 mg via RECTAL
  Filled 2018-06-08: qty 1

## 2018-06-08 NOTE — Evaluation (Signed)
Clinical/Bedside Swallow Evaluation Patient Details  Name: Kim [name] MRN: [id number] Date of Birth: 1955/08/02  Today's Date: 06/08/2018 Time: SLP Start Time (ACUTE ONLY): 37 SLP Stop Time (ACUTE ONLY): 0928 SLP Time Calculation (min) (ACUTE ONLY): 13 min  Past Medical History:  Past Medical History:  Diagnosis Date  . CHF (congestive heart failure) ([place])   . COPD (chronic obstructive pulmonary disease) ([place])   . Diabetes mellitus without complication ([place])   . GERD (gastroesophageal reflux disease)   . History of DVT (deep vein thrombosis)   . Hypercholesteremia   . Hyperlipemia   . Normal coronary arteries    by cardiac catheterization which I performed September 2015  . Schizophrenia ([place])   . Tobacco abuse    Past Surgical History:  Past Surgical History:  Procedure Laterality Date  . LEFT HEART CATHETERIZATION WITH CORONARY ANGIOGRAM N/A 02/09/2014   Procedure: LEFT HEART CATHETERIZATION WITH CORONARY ANGIOGRAM;  Surgeon: Lorretta Harp, MD;  Location: [facility] CATH LAB;  Service: Cardiovascular;  Laterality: N/A;  . none     HPI:  33 female admitted 06/04/2018 post cardiac arrest, CCM consulted for intubation s/p rosc, found to have multifocal pneumonia and COPD exacerbation. Intubated 1/14-1/17/20. On BiPAP overnight, high risk for reintubation per MD notes. PMHx: COPD, Tobacco abuse, Schizophrenia, CAD, HLD, DVT, GERD, DM2, CHF.   Assessment / Plan / Recommendation Clinical Impression   Patient presents with signs of an acute, reversible dysphagia secondary to intubation. Voice is aphonic, cough weak, appears to have significant upper airway secretions. She tolerated ice chips without overt signs of aspiration or change in vital signs, however inconsistent signs of aspiration with thin liquids, puree. Potential for silent aspiration, particularly given aphonia and weak cough. Recommend she remain NPO for now, will follow up next date for improvements at bedside vs need for  instrumental testing (MBS). May have ice chips after oral care.   SLP Visit Diagnosis: Dysphagia, unspecified (R13.10)    Aspiration Risk  Moderate aspiration risk;Severe aspiration risk    Diet Recommendation NPO;Ice chips PRN after oral care   Liquid Administration via: Spoon Medication Administration: Via alternative means    Other  Recommendations Oral Care Recommendations: Oral care QID;Oral care prior to ice chip/H20 Other Recommendations: Remove water pitcher;Prohibited food (jello, ice cream, thin soups);Have oral suction available   Follow up Recommendations Other (comment)(tbd)      Frequency and Duration min 2x/week  2 weeks       Prognosis Prognosis for Safe Diet Advancement: Good      Swallow Study   General Date of Onset: 06/04/18 HPI: 28 female admitted 06/04/2018 post cardiac arrest, CCM consulted for intubation s/p rosc, found to have multifocal pneumonia and COPD exacerbation. Intubated 1/14-1/17/20. On BiPAP overnight, high risk for reintubation per MD notes. PMHx: COPD, Tobacco abuse, Schizophrenia, CAD, HLD, DVT, GERD, DM2, CHF. Type of Study: Bedside Swallow Evaluation Previous Swallow Assessment: none in chart Diet Prior to this Study: NPO Temperature Spikes Noted: No Respiratory Status: Nasal cannula History of Recent Intubation: Yes Length of Intubations (days): 2 days Date extubated: 06/07/18 Behavior/Cognition: Alert;Cooperative Oral Cavity Assessment: Within Functional Limits Oral Care Completed by SLP: Yes Oral Cavity - Dentition: Adequate natural dentition Vision: Functional for self-feeding Self-Feeding Abilities: Able to feed self Patient Positioning: Upright in bed Baseline Vocal Quality: Aphonic Volitional Cough: Weak;Congested    Oral/Motor/Sensory Function Overall Oral Motor/Sensory Function: Within functional limits   Ice Chips Ice chips: Within functional limits   Thin Liquid Thin Liquid:  Impaired Presentation:  Spoon;Cup Pharyngeal  Phase Impairments: Change in Vital Signs;Cough - Delayed    Nectar Thick Nectar Thick Liquid: Not tested   Honey Thick Honey Thick Liquid: Not tested   Puree Puree: Impaired Pharyngeal Phase Impairments: Cough - Immediate   Solid     Solid: Not tested     Deneise Lever, MS, CCC-SLP Speech-Language Pathologist Acute Rehabilitation Services Pager: [phone] Office: [phone]  Aliene Altes 06/08/2018,9:37 AM

## 2018-06-08 NOTE — Progress Notes (Signed)
Pt on Bipap overnight & has tolerated well. At [place], Pt request to take Bipap off r/t coughing/ secretions. Pt placed on  6 L nasal canula, and 02 Sat currently 90-92%. RN will continue to monitor.

## 2018-06-08 NOTE — Progress Notes (Signed)
NAME:  Kim [name], MRN:  [id number], DOB:  1955-09-18, LOS: 4 ADMISSION DATE:  06/04/2018, CONSULTATION DATE:  06/04/2018 REFERRING MD:  EDP, CHIEF COMPLAINT:  Cardiac arrest   Brief History   63 y/o female admited on 1/14 with cardiac arrest, had pneumonia, COPD exacerbation.  Extubated 1/17.   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 rate (!) 26, height 5\' 5"  (1.651 m), weight 70.6 kg, SpO2 94 %.    Vent Mode: BIPAP;PCV FiO2 (%):  [40 %-100 %] 40 % Set Rate:  [12 bmp-26 bmp] 12 bmp Vt Set:  [450 mL] 450 mL PEEP:  [5 cmH20-6 cmH20] 6 cmH20 Plateau Pressure:  [20 cmH20] 20 cmH20   Intake/Output Summary (Last 24 hours) at 06/08/2018 1027 Last data filed at 06/08/2018 0700 Gross per 24 hour  Intake 209.6 ml  Output 6135 ml  Net -5925.4 ml   Filed Weights   06/06/18 0420 06/07/18 0300 06/08/18 0500  Weight: 73.4 kg 73.7 kg 70.6 kg    Examination:  Gen: chronically ill appearing, tachypnea but no accessory muscle use HENT: OP clear, TM's clear, neck supple PULM: Some wheezing, poor air movement B, normal percussion CV: RRR, no mgr, trace edema GI: BS+, soft, nontender Derm: no  cyanosis or rash Psyche: normal mood and affect   Resolved Hospital Problem list     Assessment & Plan:  Acute respiratory failure with hypoxemia: remains hypoxemic, tenuous, high risk for reintubation > remain in ICU > BIPAP prn for now > NTS suction > sit up in bed > no chest PT given rib fractures > give pillow for splinting  COPD with acute exacerbation: > continue duoneb > add brovana/pulmicort  Multifocal pneumonia > ceftriaxone > monitor cultures  Cardiac arrest due to respiratory failure > tele  Hyponatermia > free water restrict > monitor   Hyperglycemia > SSI   Best practice:  Diet: NPO, SLP eval Pain/Anxiety/Delirium protocol (if indicated): n/a VAP protocol (if indicated): n/a DVT prophylaxis: lovenox GI prophylaxis: n/a Glucose control: SSI Mobility: PT consult Code Status: full Family Communication: none bedsid Disposition: remain in ICU  Labs   CBC: Recent Labs  Lab 06/04/18 1051 06/05/18 0357 06/06/18 0421 06/07/18 0300 06/08/18 0212  WBC 24.4* 17.2* 14.7* 19.6* 17.2*  HGB 15.5* 13.6 14.4 13.7 15.0  HCT 44.8 40.8 44.4 42.0 47.4*  MCV 93.5 94.4 95.3 97.2 96.7  PLT 122* 153 162 175 253    Basic Metabolic Panel: Recent Labs  Lab 06/04/18 1051 06/05/18 0357 06/06/18 0421 06/06/18 1358 06/06/18 1700 06/07/18 0300 06/07/18 1617 06/08/18 0212  NA 122* 126* 129*  --   --  131*  --  133*  K 4.4  4.3 3.1*  --   --  4.1  --  3.2*  CL 87* 88* 88*  --   --  92*  --  81*  CO2 24 31 34*  --   --  35*  --  41*  GLUCOSE 129* 88 120*  --   --  126*  --  105*  BUN 16 10 9   --   --  14  --  15  CREATININE 0.80 0.76 0.71  --   --  0.68  --  0.72  CALCIUM 7.8* 8.6* 9.2  --   --  9.3  --  9.7  MG 1.3*  --  1.7 1.9 1.8 1.8 1.5* 1.5*  PHOS 3.4  --  2.3* 3.2 3.1 2.6 4.5 4.4   GFR: Estimated Creatinine Clearance: 70.9 mL/min (by C-G formula based on SCr of 0.72 mg/dL). Recent Labs  Lab 06/04/18 0658 06/04/18 1051 06/05/18 0357  06/06/18 0421 06/07/18 0300 06/08/18 0212  PROCALCITON  --  0.36 0.81 0.54  --   --   WBC  --  24.4* 17.2* 14.7* 19.6* 17.2*  LATICACIDVEN 8.70* 1.5  --   --   --   --     Liver Function Tests: Recent Labs  Lab 06/04/18 1051 06/08/18 0212  AST 40 13*  ALT 28 20  ALKPHOS 84 79  BILITOT 1.3* 0.7  PROT 4.8* 6.1*  ALBUMIN 2.2* 2.5*   Recent Labs  Lab 06/04/18 1051  LIPASE 18  AMYLASE 195*   No results for input(s): AMMONIA in the last 168 hours.  ABG    Component Value Date/Time   PHART 7.414 06/08/2018 0531   PCO2ART 70.8 (HH) 06/08/2018 0531   PO2ART 69.0 (L) 06/08/2018 0531   HCO3 45.3 (H) 06/08/2018 0531   TCO2 47 (H) 06/08/2018 0531   O2SAT 93.0 06/08/2018 0531     Coagulation Profile: Recent Labs  Lab 06/04/18 0652 06/04/18 1142  INR 1.39 1.70    Cardiac Enzymes: Recent Labs  Lab 06/04/18 1051 06/04/18 1306 06/04/18 1950  TROPONINI 0.11* 0.16* 0.11*    HbA1C: Hgb A1c MFr Bld  Date/Time Value Ref Range Status  03/13/2018 11:56 AM 5.7 (H) 4.8 - 5.6 % Final    Comment:             Prediabetes: 5.7 - 6.4          Diabetes: >6.4          Glycemic control for adults with diabetes: <7.0     CBG: Recent Labs  Lab 06/07/18 1200 06/07/18 1317 06/07/18 1626 06/07/18 1942 06/07/18 2344  GLUCAP 112* 134* 94 98 96     Critical care time: 35 minutes    Roselie Awkward, MD [place] Pager: [phone] Cell: [phone] If no response, call [phone]

## 2018-06-09 DIAGNOSIS — F068 Other specified mental disorders due to known physiological condition: Secondary | ICD-10-CM | POA: Diagnosis not present

## 2018-06-09 DIAGNOSIS — Z9289 Personal history of other medical treatment: Secondary | ICD-10-CM

## 2018-06-09 DIAGNOSIS — Z978 Presence of other specified devices: Secondary | ICD-10-CM

## 2018-06-09 LAB — CULTURE, BLOOD (ROUTINE X 2)
Culture: NO GROWTH
Culture: NO GROWTH
Special Requests: ADEQUATE

## 2018-06-09 LAB — CBC WITH DIFFERENTIAL/PLATELET
Abs Immature Granulocytes: 0.08 10*3/uL — ABNORMAL HIGH (ref 0.00–0.07)
Basophils Absolute: 0 10*3/uL (ref 0.0–0.1)
Basophils Relative: 0 %
Eosinophils Absolute: 0.1 10*3/uL (ref 0.0–0.5)
Eosinophils Relative: 1 %
HEMATOCRIT: 45.9 % (ref 36.0–46.0)
Hemoglobin: 14.7 g/dL (ref 12.0–15.0)
Immature Granulocytes: 1 %
LYMPHS PCT: 12 %
Lymphs Abs: 1.4 10*3/uL (ref 0.7–4.0)
MCH: 30.7 pg (ref 26.0–34.0)
MCHC: 32 g/dL (ref 30.0–36.0)
MCV: 95.8 fL (ref 80.0–100.0)
MONO ABS: 1.1 10*3/uL — AB (ref 0.1–1.0)
Monocytes Relative: 9 %
Neutro Abs: 9 10*3/uL — ABNORMAL HIGH (ref 1.7–7.7)
Neutrophils Relative %: 77 %
Platelets: 190 10*3/uL (ref 150–400)
RBC: 4.79 MIL/uL (ref 3.87–5.11)
RDW: 12.7 % (ref 11.5–15.5)
WBC: 11.6 10*3/uL — ABNORMAL HIGH (ref 4.0–10.5)
nRBC: 0 % (ref 0.0–0.2)

## 2018-06-09 LAB — BASIC METABOLIC PANEL
Anion gap: 9 (ref 5–15)
BUN: 15 mg/dL (ref 8–23)
CO2: 35 mmol/L — ABNORMAL HIGH (ref 22–32)
CREATININE: 0.63 mg/dL (ref 0.44–1.00)
Calcium: 9.7 mg/dL (ref 8.9–10.3)
Chloride: 88 mmol/L — ABNORMAL LOW (ref 98–111)
GFR calc Af Amer: >60 mL/min (ref >60)
GFR calc non Af Amer: >60 mL/min (ref >60)
GLUCOSE: 95 mg/dL (ref 70–99)
Potassium: 3.5 mmol/L (ref 3.5–5.1)
Sodium: 132 mmol/L — ABNORMAL LOW (ref 135–145)

## 2018-06-09 LAB — GLUCOSE, CAPILLARY
GLUCOSE-CAPILLARY: 115 mg/dL — AB (ref 70–99)
Glucose-Capillary: 89 mg/dL (ref 70–99)
Glucose-Capillary: 91 mg/dL (ref 70–99)
Glucose-Capillary: 128 mg/dL — ABNORMAL HIGH (ref 70–99)
Glucose-Capillary: 87 mg/dL (ref 70–99)

## 2018-06-09 LAB — MAGNESIUM: Magnesium: 1.8 mg/dL (ref 1.7–2.4)

## 2018-06-09 MED ORDER — POTASSIUM CHLORIDE 10 MEQ/50ML IV SOLN
10.0000 meq | INTRAVENOUS | Status: DC
  Administered 2018-06-09 (×2): 10 meq via INTRAVENOUS
  Filled 2018-06-09 (×2): qty 50

## 2018-06-09 MED ORDER — VITAMIN B-1 100 MG PO TABS
100.0000 mg | ORAL_TABLET | Freq: Every day | ORAL | Status: DC
  Administered 2018-06-10 – 2018-06-12 (×3): 100 mg via ORAL
  Filled 2018-06-09 (×3): qty 1

## 2018-06-09 MED ORDER — IPRATROPIUM-ALBUTEROL 0.5-2.5 (3) MG/3ML IN SOLN: 3.0000 mL | Freq: Four times a day (QID) | RESPIRATORY_TRACT | Status: DC | PRN

## 2018-06-09 MED ORDER — FOLIC ACID 1 MG PO TABS
1.0000 mg | ORAL_TABLET | Freq: Every day | ORAL | Status: DC
  Administered 2018-06-10 – 2018-06-12 (×3): 1 mg via ORAL
  Filled 2018-06-09 (×3): qty 1

## 2018-06-09 MED ORDER — POTASSIUM CHLORIDE 10 MEQ/50ML IV SOLN
10.0000 meq | INTRAVENOUS | Status: AC
  Administered 2018-06-09 (×3): 10 meq via INTRAVENOUS
  Filled 2018-06-09 (×3): qty 50

## 2018-06-09 NOTE — Progress Notes (Signed)
Patient was given ordered meal tray per speech orders and had coughing spells throughout meal and afterwards.  New order placed for speech to re-evaluate patient.  Dinner tray to be deferred.  Pt had poor appetite as well.

## 2018-06-09 NOTE — Progress Notes (Signed)
Over this shift, the patient has become fixated on 2 items.  Pt. Kept asking when the central line could be removed and would be re-educated on ly for patient to ask again with in 45 minutes.  After central line removed, patient became fixated on removing nasal cannula.  Educated on need for oxygen due to PNA and that her oxygen levels cannot remain in normal range without it.  Has required re-education several time.

## 2018-06-09 NOTE — Progress Notes (Signed)
  Speech Language Pathology Treatment: Dysphagia  Patient Details Name: Kim [name] MRN: [id number] DOB: May 18, 1956 Today's Date: 06/09/2018 Time: 8280-0349 SLP Time Calculation (min) (ACUTE ONLY): 17 min  Assessment / Plan / Recommendation Clinical Impression  Pt with improved vocal quality, cough strength today. She is moderately hoarse, and is able to cough/expectorate secretions. Respiratory rate stable in low-mid 20s. She self-feeds thin liquids (8 oz) via straw and 4 oz puree without overt signs of aspiration or respiratory distress, vocal quality clear. Respiratory rate increases slightly with exertion, but more significantly with mastication of regular cracker, and pt had immediate coughing episode after attempting regular solids. Suspect dysphagia is respiratory-based. Would initiate dys 2, thin liquids, to minimize efforts of mastication. Meds whole in puree. Supervision to ensure pt takes rest breaks when appropriate; hold POs if short of breath or respirations >30. D/w RN.    HPI HPI: 72 female admitted 06/04/2018 post cardiac arrest, CCM consulted for intubation s/p rosc, found to have multifocal pneumonia and COPD exacerbation. Intubated 1/14-1/17/20. On BiPAP overnight, high risk for reintubation per MD notes. PMHx: COPD, Tobacco abuse, Schizophrenia, CAD, HLD, DVT, GERD, DM2, CHF.      SLP Plan  Continue with current plan of care       Recommendations  Diet recommendations: Dysphagia 2 (fine chop);Thin liquid Liquids provided via: Cup;Straw Medication Administration: Whole meds with puree Supervision: Patient able to self feed;Full supervision/cueing for compensatory strategies Compensations: Slow rate;Small sips/bites                Oral Care Recommendations: Oral care BID Follow up Recommendations: Other (comment)(tbd) SLP Visit Diagnosis: Dysphagia, unspecified (R13.10) Plan: Continue with current plan of care       [place], [place],  [place] Pathologist Acute Rehabilitation Services Pager: [phone] Office: [phone]   Aliene Altes 06/09/2018, 10:38 AM

## 2018-06-09 NOTE — Progress Notes (Signed)
Patient requested that she have her wallet to call the shelter.  Patient belongings bag emptied. Jeans, thermal shirt, green shirt, and bra all cut off of patient and placed into bag.  Patient in agreement to throw them away.  Patient has a white leather band watch, key and whistle on a red lanyard and her wallet in a biohazard bag labeled with patient.  Patient more irritable today and requests to "go home." Educated patient that she stills needs a couple more days in the hospital but the doctor will be rounding shortly and he can explain.  Verbalized understanding,.  Patient refused to have wallet locked in safe.  Placed inside her other bag with her black shoes, socks, and black suspenders.  This nurse instructed patent on use of phone in room.  She made contact with the shelter and advised me that she will need a discharge summary to go back.  Advised that one is always provided on discharge.  Verbalized understanding.

## 2018-06-09 NOTE — Evaluation (Signed)
Occupational Therapy Evaluation Patient Details Name: Kim [name] MRN: [id number] DOB: 1955/06/20 Today's Date: 06/09/2018    History of Present Illness 61 female admitted 06/04/2018 post cardiac arrest, CCM consulted for intubation s/p rosc, found to have multifocal pneumonia and COPD exacerbation. Extubated 1/17 in am.     Clinical Impression   PTA Pt independent in ADL and mobility, living in homeless shelter. Pt is currently min A for transfers, min A/close min guard for sink level grooming, able to perform figure 4 access for LB dressing. Pt was able to walk a lap of the cardiac ICU with EVA walker on 3L O2 (desaturated and had to be bumped up to 4L) at min A with +2 for chair follow. She was able to perform toilet transfer at min A, and peri care with set up. Pt also presents with generalized weakness, decreased safety awareness, deconditioning, decreased balance, and will require skilled OT in the acute setting as well as afterwards at the SNF level.     Follow Up Recommendations  SNF;Supervision/Assistance - 24 hour    Equipment Recommendations  Other (comment)(defer to next venue)    Recommendations for Other Services       Precautions / Restrictions Precautions Precautions: Fall Precaution Comments: wacth O2 saturations Restrictions Weight Bearing Restrictions: No      Mobility Bed Mobility               General bed mobility comments: OOB in recliner at beginning and end of session  Transfers Overall transfer level: Needs assistance Equipment used: (EVA walker) Transfers: Sit to/from Stand Sit to Stand: Min assist;+2 safety/equipment         General transfer comment: vc for safe hand placement, assist to rise    Balance Overall balance assessment: Needs assistance Sitting-balance support: Bilateral upper extremity supported;Feet supported;No upper extremity supported Sitting balance-Leahy Scale: Fair     Standing balance support: Bilateral upper  extremity supported Standing balance-Leahy Scale: Poor Standing balance comment: dependent on external support                           ADL either performed or assessed with clinical judgement   ADL Overall ADL's : Needs assistance/impaired   Eating/Feeding Details (indicate cue type and reason): able to bring hand to mouth and hold items like utensils without difficulty Grooming: Min guard;Standing;Wash/dry hands   Upper Body Bathing: Set up;Sitting   Lower Body Bathing: Minimal assistance;Sitting/lateral leans   Upper Body Dressing : Minimal assistance   Lower Body Dressing: Minimal assistance   Toilet Transfer: Minimal assistance;Ambulation;RW Toilet Transfer Details (indicate cue type and reason): use fo grab bars, BSC over toilet for extra height Toileting- Clothing Manipulation and Hygiene: Set up [place] Manipulation Details (indicate cue type and reason): Pt able to complete peri care when provided with paper     Functional mobility during ADLs: Min guard(EVA walker) General ADL Comments: decreased activity tolerance, decreased safety awareness, asked repeated questions     Vision         Perception     Praxis      Pertinent Vitals/Pain Pain Assessment: 0-10 Pain Score: 3  Pain Location: side Pain Descriptors / Indicators: Aching;Sore Pain Intervention(s): Monitored during session;Repositioned     Hand Dominance Right   Extremity/Trunk Assessment Upper Extremity Assessment Upper Extremity Assessment: Overall WFL for tasks assessed   Lower Extremity Assessment Lower Extremity Assessment: Generalized weakness   Cervical / Trunk Assessment Cervical /  Trunk Assessment: Normal   Communication Communication Communication: No difficulties   Cognition Arousal/Alertness: Awake/alert Behavior During Therapy: Flat affect Overall Cognitive Status: Impaired/Different from baseline Area of Impairment: Safety/judgement;Problem  solving;Memory                     Memory: Decreased short-term memory   Safety/Judgement: Decreased awareness of safety;Decreased awareness of deficits   Problem Solving: Decreased initiation;Difficulty sequencing;Requires verbal cues     General Comments  Pt on 3L O2 when initially mobilizing around unit. She de-saturated to 28, and stayed at or above 90 on 4L    Exercises     Shoulder Instructions      Home Living Family/patient expects to be discharged to:: Shelter/Homeless                                        Prior Functioning/Environment Level of Independence: Independent                 OT Problem List: Decreased activity tolerance;Impaired balance (sitting and/or standing);Decreased safety awareness;Decreased cognition;Cardiopulmonary status limiting activity      OT Treatment/Interventions: Self-care/ADL training;DME and/or AE instruction;Therapeutic activities;Patient/family education;Balance training    OT Goals(Current goals can be found in the care plan section) Acute Rehab OT Goals Patient Stated Goal: "i want to go home" OT Goal Formulation: With patient Time For Goal Achievement: 06/23/18 Potential to Achieve Goals: Good ADL Goals Pt Will Perform Grooming: with modified independence;standing Pt Will Perform Upper Body Dressing: with modified independence;sitting Pt Will Perform Lower Body Dressing: with modified independence;sit to/from stand Pt Will Transfer to Toilet: with modified independence;ambulating Pt Will Perform Toileting - Clothing Manipulation and hygiene: with modified independence;sit to/from stand  OT Frequency: Min 2X/week   Barriers to D/C: Inaccessible home environment;Decreased caregiver support  Pt lives in a homeless shelter       Co-evaluation              AM-PAC OT "6 Clicks" Daily Activity     Outcome Measure Help from another person eating meals?: None Help from another person taking  care of personal grooming?: A Little Help from another person toileting, which includes using toliet, bedpan, or urinal?: A Little Help from another person bathing (including washing, rinsing, drying)?: A Little Help from another person to put on and taking off regular upper body clothing?: A Little Help from another person to put on and taking off regular lower body clothing?: A Little 6 Click Score: 19   End of Session Equipment Utilized During Treatment: Gait belt;Oxygen(3 & 4L, EVA walker) Nurse Communication: Mobility status  Activity Tolerance: Patient tolerated treatment well Patient left: in chair;with call bell/phone within reach  OT Visit Diagnosis: Unsteadiness on feet (R26.81);Other abnormalities of gait and mobility (R26.89);Muscle weakness (generalized) (M62.81);Other symptoms and signs involving cognitive function                Time: 1358-1426 OT Time Calculation (min): 28 min Charges:  OT General Charges $OT Visit: 1 Visit OT Evaluation $OT Eval Moderate Complexity: 1 Mod OT Treatments $Self Care/Home Management : 8-22 mins  Hulda Humphrey OTR/L Acute Rehabilitation Services Pager: [phone] Office: [place] 06/09/2018, 3:29 PM

## 2018-06-09 NOTE — Progress Notes (Addendum)
NAME:  Kim [name], MRN:  [id number], DOB:  02/23/1956, LOS: 5 ADMISSION DATE:  06/04/2018 REFERRING MD: Emergency department physician, CHIEF COMPLAINT: Status post cardiac arrest  Brief History   60 female admitted 06/04/2018 post cardiac arrest, CCM consulted for intubation s/p rosc, found to have multifocal pneumonia and COPD exacerbation.   Past Medical History  COPD, tobacco abuse, schizophrenia, CAD, HLD, DVT, GERD, TIIDM, CHF  Significant Hospital Events   06/04/2018 admitted for presumed cardiac arrest 1/17 extubated  Consults:  none  Procedures:  06/04/2018 intubation 06/04/2018 left IJ CVL placed  Significant Diagnostic Tests:  06/04/2018 CT of the head 06/04/2018 EKG 06/04/2018 EEG 06/04/2018 2D echo >> LFEF 50-55%, RV mod dilated.   Micro Data:  1/14 RVP >> rhinovirus/enterovirus 06/04/2018 blood cultures x2 >> none 06/04/2018 sputum culture >> H. influenzae 06/04/2018 urine culture >> none   Antimicrobials:  06/04/2018 vancomycin >> 1/15 06/04/2018 cefepime >> 1/17 06/07/2018 ceftriaxone >>   Interim history/subjective:  States she is feeling ok, no SOB, wants to take [place] off. Would like to try getting out of bed. States she does not have a cough but still feels weak.   Objective   Blood pressure 130/83, pulse 64, temperature 98.4 F (36.9 C), resp. rate (!) 23, height 5\' 5"  (1.651 m), weight 68.5 kg, SpO2 93 %.        Intake/Output Summary (Last 24 hours) at 06/09/2018 6283 Last data filed at 06/09/2018 0500 Gross per 24 hour  Intake 671.74 ml  Output 1035 ml  Net -363.26 ml   Filed Weights   06/07/18 0300 06/08/18 0500 06/09/18 0435  Weight: 73.7 kg 70.6 kg 68.5 kg    Examination: General: acutely ill appearing, a&o   HENT: [place] in place, [place], AT Lungs: decrease breath sounds left with crackles, otherwise CTA, no wheezing or rhonchi Cardiovascular: RRR, no m/r/g Abdomen: +BS, soft, NTTP, non-distended Extremities: Warm and dry, no edema  Neuro: a&o,  cooperative, normal affect  Resolved Hospital Problem list   Mixed metabolic acidosis  Altered mental status  Pulmonary edema   Assessment & Plan:  Initial respiratory failure secondary to presumed cardiac arrest after collapsing outside with friends. Positive H. Influenzae pneumonia and pulmonary edema. EMS found her asystole no shock via received 2 epi CPR return of spontaneous circulation within 5 minutes. Extubated 1/17.   Hypercarbic Respiratory Failure  H. Influenzae Multifocal Pneumonia/Enterovirus/Rhinovirus on Ventilator Ext. 1/17. Decreasing bipap use, did not need overnight. H. Influenza & RVP+ rhinovirus/enterovirus.  -cont brovana, pulmicort. -bipap prn  -duonebs q6h prn -cont. ceftriaxone  -incentive spirometer  -PT/OT -wean for O2 sats 88-92% -oob to chair -am labs - will need f/u CT 3-4 weeks once pneumonia resolved due to nodular opacities -transfer to [facility]   Hyponatremia 132 today. Stable.  -Continue to monitor -am labs  Hyperglycemia -Sliding scale insulin  Schizophrenia She has not been taking her risperdal prior to admission as it makes her drowsy.   Best practice:  Diet: N.p.o. Pain/Anxiety/Delirium protocol (if indicated): n/a VAP protocol (if indicated): n/a DVT prophylaxis: lovenox GI prophylaxis: n/a Glucose control: Sliding scale insulin Mobility: PT consulted Code Status: Full Family Communication: none bedside Disposition: stable for transfer  Labs   CBC: Recent Labs  Lab 06/05/18 0357 06/06/18 0421 06/07/18 0300 06/08/18 0212 06/09/18 0427  WBC 17.2* 14.7* 19.6* 17.2* 11.6*  NEUTROABS  --   --   --   --  9.0*  HGB 13.6 14.4 13.7 15.0 14.7  HCT 40.8 44.4 42.0  47.4* 45.9  MCV 94.4 95.3 97.2 96.7 95.8  PLT 153 162 175 197 387    Basic Metabolic Panel: Recent Labs  Lab 06/05/18 0357 06/06/18 0421 06/06/18 1358 06/06/18 1700 06/07/18 0300 06/07/18 1617 06/08/18 0212 06/09/18 0427  NA 126* 129*  --   --  131*  --   133* 132*  K 4.3 3.1*  --   --  4.1  --  3.2* 3.5  CL 88* 88*  --   --  92*  --  81* 88*  CO2 31 34*  --   --  35*  --  41* 35*  GLUCOSE 88 120*  --   --  126*  --  105* 95  BUN 10 9  --   --  14  --  15 15  CREATININE 0.76 0.71  --   --  0.68  --  0.72 0.63  CALCIUM 8.6* 9.2  --   --  9.3  --  9.7 9.7  MG  --  1.7 1.9 1.8 1.8 1.5* 1.5* 1.8  PHOS  --  2.3* 3.2 3.1 2.6 4.5 4.4  --    GFR: Estimated Creatinine Clearance: 70 mL/min (by C-G formula based on SCr of 0.63 mg/dL). Recent Labs  Lab 06/04/18 0658 06/04/18 1051 06/05/18 0357 06/06/18 0421 06/07/18 0300 06/08/18 0212 06/09/18 0427  PROCALCITON  --  0.36 0.81 0.54  --   --   --   WBC  --  24.4* 17.2* 14.7* 19.6* 17.2* 11.6*  LATICACIDVEN 8.70* 1.5  --   --   --   --   --     Liver Function Tests: Recent Labs  Lab 06/04/18 1051 06/08/18 0212  AST 40 13*  ALT 28 20  ALKPHOS 84 79  BILITOT 1.3* 0.7  PROT 4.8* 6.1*  ALBUMIN 2.2* 2.5*   Recent Labs  Lab 06/04/18 1051  LIPASE 18  AMYLASE 195*   No results for input(s): AMMONIA in the last 168 hours.  ABG    Component Value Date/Time   PHART 7.414 06/08/2018 0531   PCO2ART 70.8 (HH) 06/08/2018 0531   PO2ART 69.0 (L) 06/08/2018 0531   HCO3 45.3 (H) 06/08/2018 0531   TCO2 47 (H) 06/08/2018 0531   O2SAT 93.0 06/08/2018 0531     Coagulation Profile: Recent Labs  Lab 06/04/18 0652 06/04/18 1142  INR 1.39 1.70    Cardiac Enzymes: Recent Labs  Lab 06/04/18 1051 06/04/18 1306 06/04/18 1950  TROPONINI 0.11* 0.16* 0.11*    HbA1C: Hgb A1c MFr Bld  Date/Time Value Ref Range Status  03/13/2018 11:56 AM 5.7 (H) 4.8 - 5.6 % Final    Comment:             Prediabetes: 5.7 - 6.4          Diabetes: >6.4          Glycemic control for adults with diabetes: <7.0     CBG: Recent Labs  Lab 06/08/18 1121 06/08/18 1527 06/08/18 1950 06/08/18 2355 06/09/18 0350  GLUCAP 104* 95 86 91 89    Review of Systems:   Per HPI   Past Medical History  She,   has a past medical history of CHF (congestive heart failure) ([place]), COPD (chronic obstructive pulmonary disease) ([place]), Diabetes mellitus without complication ([place]), GERD (gastroesophageal reflux disease), History of DVT (deep vein thrombosis), Hypercholesteremia, Hyperlipemia, Normal coronary arteries, Schizophrenia ([place]), and Tobacco abuse.   Surgical History    Past Surgical History:  Procedure  Laterality Date  . LEFT HEART CATHETERIZATION WITH CORONARY ANGIOGRAM N/A 02/09/2014   Procedure: LEFT HEART CATHETERIZATION WITH CORONARY ANGIOGRAM;  Surgeon: Lorretta Harp, MD;  [facility] LAB;  Service: Cardiovascular;  Laterality: N/A;  . none       Social History   reports that she has been smoking cigarettes. She has a 21.00 pack-year smoking history. She has never used smokeless tobacco. She reports that she does not drink alcohol or use drugs.   Family History   Her family history is not on file.   Allergies Allergies  Allergen Reactions  . Asa [Aspirin] Other (See Comments)    Stomach pain  . Metformin And Related Other (See Comments)    STOMACH PROBLEMS  . Penicillins Nausea And Vomiting    Large doses  . Quetiapine Other (See Comments)    REACTION: nausea/dizzy  . Acetaminophen Nausea And Vomiting and Rash     Home Medications  Prior to Admission medications   Medication Sig Start Date End Date Taking? Authorizing Provider  albuterol (VENTOLIN HFA) 108 (90 Base) MCG/ACT inhaler Inhale 2 puffs into the lungs every 4 (four) hours as needed for wheezing or shortness of breath. 10/23/17   Rigoberto Noel, MD  aspirin 81 MG chewable tablet Chew 1 tablet (81 mg total) by mouth daily. 03/13/18   [name], FNP  azithromycin (ZITHROMAX) 250 MG tablet Take 1 tablet (250 mg total) by mouth daily. Take first 2 tablets together, then 1 every day until finished. 04/03/18   Hedges, Dellis Filbert, PA-C  benzonatate (TESSALON) 100 MG capsule Take 1 capsule (100 mg total) by mouth every 8  (eight) hours. 04/03/18   Hedges, Dellis Filbert, PA-C  cholecalciferol (VITAMIN D) 1000 units tablet Take 1,000 Units by mouth daily.    [provider]  Fluticasone-Salmeterol (ADVAIR) 250-50 MCG/DOSE AEPB Inhale 1 puff into the lungs 2 (two) times daily. 03/13/18   [name], FNP  furosemide (LASIX) 20 MG tablet TAKE 1 OR 2 TABLETS BY MOUTH AS NEEDED 03/13/18   [name], FNP  risperiDONE (RISPERDAL) 2 MG tablet Take 2 mg by mouth at bedtime.    [provider]  tiotropium (SPIRIVA) 18 MCG inhalation capsule Place 1 capsule (18 mcg total) into inhaler and inhale daily. 03/13/18   [name], FNP         Marty Heck, DO 06/09/2018, 6:23 AM Pager: [phone]

## 2018-06-09 NOTE — Progress Notes (Signed)
Dr. Lake Bells wanted this nurse to inquire on when patient last took Risperdal.  Patient reports not taking anything for her schizophrenia and stopped taking the Risperdal due to excessive drowsiness.

## 2018-06-10 DIAGNOSIS — F068 Other specified mental disorders due to known physiological condition: Secondary | ICD-10-CM | POA: Diagnosis not present

## 2018-06-10 LAB — CBC
HCT: 45.5 % (ref 36.0–46.0)
Hemoglobin: 15.3 g/dL — ABNORMAL HIGH (ref 12.0–15.0)
MCH: 31.9 pg (ref 26.0–34.0)
MCHC: 33.6 g/dL (ref 30.0–36.0)
MCV: 95 fL (ref 80.0–100.0)
NRBC: 0 % (ref 0.0–0.2)
Platelets: 242 10*3/uL (ref 150–400)
RBC: 4.79 MIL/uL (ref 3.87–5.11)
RDW: 12.7 % (ref 11.5–15.5)
WBC: 12 10*3/uL — ABNORMAL HIGH (ref 4.0–10.5)

## 2018-06-10 LAB — PHOSPHORUS: Phosphorus: 2.8 mg/dL (ref 2.5–4.6)

## 2018-06-10 LAB — BASIC METABOLIC PANEL
Anion gap: 9 (ref 5–15)
BUN: 13 mg/dL (ref 8–23)
CO2: 32 mmol/L (ref 22–32)
Calcium: 9.7 mg/dL (ref 8.9–10.3)
Chloride: 91 mmol/L — ABNORMAL LOW (ref 98–111)
Creatinine, Ser: 0.49 mg/dL (ref 0.44–1.00)
GFR calc Af Amer: >60 mL/min (ref >60)
GFR calc non Af Amer: >60 mL/min (ref >60)
Glucose, Bld: 95 mg/dL (ref 70–99)
Potassium: 3.5 mmol/L (ref 3.5–5.1)
Sodium: 132 mmol/L — ABNORMAL LOW (ref 135–145)

## 2018-06-10 LAB — MAGNESIUM: Magnesium: 1.5 mg/dL — ABNORMAL LOW (ref 1.7–2.4)

## 2018-06-10 LAB — GLUCOSE, CAPILLARY
Glucose-Capillary: 107 mg/dL — ABNORMAL HIGH (ref 70–99)
Glucose-Capillary: 87 mg/dL (ref 70–99)
Glucose-Capillary: 89 mg/dL (ref 70–99)

## 2018-06-10 MED ORDER — FLUTICASONE FUROATE-VILANTEROL 200-25 MCG/INH IN AEPB
1.0000 | INHALATION_SPRAY | Freq: Every day | RESPIRATORY_TRACT | Status: DC
  Administered 2018-06-10 – 2018-06-12 (×2): 1 via RESPIRATORY_TRACT
  Filled 2018-06-10 (×2): qty 28

## 2018-06-10 MED ORDER — MAGNESIUM SULFATE 4 GM/100ML IV SOLN
4.0000 g | Freq: Once | INTRAVENOUS | Status: AC
  Administered 2018-06-10: 4 g via INTRAVENOUS
  Filled 2018-06-10: qty 100

## 2018-06-10 MED ORDER — UMECLIDINIUM BROMIDE 62.5 MCG/INH IN AEPB
1.0000 | INHALATION_SPRAY | Freq: Every day | RESPIRATORY_TRACT | Status: DC
  Administered 2018-06-10 – 2018-06-12 (×2): 1 via RESPIRATORY_TRACT
  Filled 2018-06-10 (×2): qty 7

## 2018-06-10 NOTE — Progress Notes (Signed)
  Speech Language Pathology Treatment: Dysphagia  Patient Details Name: Kim [name] MRN: [id number] DOB: 1956-01-24 Today's Date: 06/10/2018 Time: 0912-0924 SLP Time Calculation (min) (ACUTE ONLY): 12 min  Assessment / Plan / Recommendation Clinical Impression  Pt had occasional coughing during PO intake this morning with SLP, although she and RN report intermittent coughing due to congestion throughout the morning. Pt did not want solids (other than accepting her meds in applesauce), but she and her RN also share that breakfast went well this morning without overt difficulties. SLP reiterated the importance of aspiration precautions and educated pt on respiratory-based strategies as well. Of note, she believes that her vocal quality is at baseline (but with reduced breath support). Would maintain current diet but continue to monitor closely. Should she have any further concern for dysphagia would proceed with instrumental testing.   HPI HPI: 56 female admitted 06/04/2018 post cardiac arrest, CCM consulted for intubation s/p rosc, found to have multifocal pneumonia and COPD exacerbation. Intubated 1/14-1/17/20. On BiPAP overnight, high risk for reintubation per MD notes. PMHx: COPD, Tobacco abuse, Schizophrenia, CAD, HLD, DVT, GERD, DM2, CHF.      SLP Plan  Continue with current plan of care       Recommendations  Diet recommendations: Dysphagia 2 (fine chop);Thin liquid Liquids provided via: Cup;Straw Medication Administration: Whole meds with puree Supervision: Patient able to self feed;Full supervision/cueing for compensatory strategies Compensations: Slow rate;Small sips/bites Postural Changes and/or Swallow Maneuvers: Seated upright 90 degrees;Upright 30-60 min after meal                Oral Care Recommendations: Oral care BID Follow up Recommendations: (tba) SLP Visit Diagnosis: Dysphagia, unspecified (R13.10) Plan: Continue with current plan of care       GO                 Germain Osgood 06/10/2018, 9:28 AM  Germain Osgood, M.A. [place] Acute Environmental education officer [phone] Office [phone]

## 2018-06-10 NOTE — Progress Notes (Signed)
Physical Therapy Treatment Patient Details Name: Kim [name] MRN: [id number] DOB: 08/05/1955 Today's Date: 06/10/2018    History of Present Illness 22 female admitted 06/04/2018 post cardiac arrest, CCM consulted for intubation s/p rosc, found to have multifocal pneumonia and COPD exacerbation. Extubated 1/17 in am.      PT Comments    Patient seen for activity progression. Continues to required supplemental O2 for decreased endurance. Progressing well with mobility but patient will need to be very high level for safe d/c. Recommendations remain for ST SNF, will continue to follow progression.   Follow Up Recommendations  SNF;Supervision/Assistance - 24 hour(vs home? pending progression)     Equipment Recommendations  None recommended by PT    Recommendations for Other Services       Precautions / Restrictions Precautions Precautions: Fall Precaution Comments: wacth O2 saturations Restrictions Weight Bearing Restrictions: No    Mobility  Bed Mobility               General bed mobility comments: OOB in recliner at beginning and end of session  Transfers Overall transfer level: Needs assistance Equipment used: (EVA walker) Transfers: Sit to/from Stand Sit to Stand: Min guard         General transfer comment: Min guard for safety  Ambulation/Gait Ambulation/Gait assistance: Min guard Gait Distance (Feet): 360 Feet Assistive device: (eva walker) Gait Pattern/deviations: Step-through pattern;Decreased stride length;Trunk flexed Gait velocity: decreased   General Gait Details: patient ambulated with modest instability using eva walker for UE support. Ambulated on 4 liters [place] with saturations and HR stable.   Stairs             Wheelchair Mobility    Modified Rankin (Stroke Patients Only)       Balance Overall balance assessment: Needs assistance Sitting-balance support: Bilateral upper extremity supported;Feet supported;No upper extremity  supported Sitting balance-Leahy Scale: Fair     Standing balance support: Bilateral upper extremity supported Standing balance-Leahy Scale: Fair Standing balance comment: able to static stand without assist                            Cognition Arousal/Alertness: Awake/alert Behavior During Therapy: Flat affect Overall Cognitive Status: Within Functional Limits for tasks assessed                                        Exercises      General Comments General comments (skin integrity, edema, etc.): ambulated on 4 liters [place] with stable saturations      Pertinent Vitals/Pain Pain Assessment: Faces Faces Pain Scale: Hurts little more Pain Location: flank Pain Descriptors / Indicators: Aching;Sore Pain Intervention(s): Monitored during session    Home Living                      Prior Function            PT Goals (current goals can now be found in the care plan section) Acute Rehab PT Goals Patient Stated Goal: "i want to go home" PT Goal Formulation: Patient unable to participate in goal setting Time For Goal Achievement: 06/21/18 Potential to Achieve Goals: Fair Progress towards PT goals: Progressing toward goals    Frequency    Min 3X/week      PT Plan Current plan remains appropriate    Co-evaluation  AM-PAC PT "6 Clicks" Mobility   Outcome Measure  Help needed turning from your back to your side while in a flat bed without using bedrails?: A Little Help needed moving from lying on your back to sitting on the side of a flat bed without using bedrails?: A Little Help needed moving to and from a bed to a chair (including a wheelchair)?: A Little Help needed standing up from a chair using your arms (e.g., wheelchair or bedside chair)?: A Little Help needed to walk in hospital room?: A Little Help needed climbing 3-5 steps with a railing? : A Lot 6 Click Score: 17    End of Session Equipment Utilized  During Treatment: Oxygen Activity Tolerance: Patient tolerated treatment well Patient left: in chair;with call bell/phone within reach;with chair alarm set Nurse Communication: Mobility status PT Visit Diagnosis: Muscle weakness (generalized) (M62.81)     Time: 5859-2924 PT Time Calculation (min) (ACUTE ONLY): 17 min  Charges:  $Gait Training: 8-22 mins                     Alben Deeds, PT DPT  Board Certified Neurologic Specialist [place] Pager [phone] Office [place] 06/10/2018, 1:12 PM

## 2018-06-10 NOTE — Progress Notes (Signed)
Nutrition Follow-up  DOCUMENTATION CODES:   Not applicable  INTERVENTION:   Ensure Enlive po BID, each supplement provides 350 kcal and 20 grams of protein   NUTRITION DIAGNOSIS:   Inadequate oral intake related to acute illness as evidenced by NPO status.  Being addressed as diet advanced, taking some po, supplements  GOAL:   Patient will meet greater than or equal to 90% of their needs  Progressing  MONITOR:   TF tolerance, Vent status, Labs  REASON FOR ASSESSMENT:   Ventilator    ASSESSMENT:    63 yo female admitted 06/04/18 post cardiac arrest, intubated s/p rosc with mixed acidosis, also found to have multifocal pneumonia and COPD exacerbation. PMH includes COPD  1/14 Cardiac arrest, Intubated 1/17 Extubated 1/19 Diet advanced to Dysphagia II/Thins post SLP eval  Pt walking with PT this AM Recorded po intake 75% at breakfast this AM, 25% yesterday  Current wt 68.5 kg; weight Net negative almost 10 L per I/O flow sheet; noted some unmeasured urine occurrence as well. Noted admission wt 78 kg  Labs: sodium 132 (L), magnesium 1.5 (L); CBGs 80-998 Meds: folic acid, thiamine  Diet Order:   Diet Order            DIET DYS 2 Room service appropriate? Yes; Fluid consistency: Thin  Diet effective now              EDUCATION NEEDS:   Not appropriate for education at this time  Skin:  Skin Assessment: Reviewed RN Assessment  Last BM:  1/19  Height:   Ht Readings from Last 1 Encounters:  06/04/18 5\' 5"  (1.651 m)    Weight:   Wt Readings from Last 1 Encounters:  06/10/18 68.5 kg    BMI:  Body mass index is 25.13 kg/m.  Estimated Nutritional Needs:   Kcal:  1700-1900 kcals   Protein:  85-95 g  Fluid:  >/= 1.5 L   Kerman Passey MS, RD, LDN, CNSC [phone] Pager  [phone] Weekend/On-Call Pager

## 2018-06-10 NOTE — Care Management Note (Addendum)
Case Management Note  Patient Details  Name: Kim [name] MRN: [id number] Date of Birth: [birth year]  Subjective/Objective:  63 yo female presented post cardiac arrest.                 Action/Plan: CM following for transitional needs. Patient is from [facility] homeless shelter ([place]). PT/OT eval complete with ST SNF recommended with CSW consulted. CM team will continue to follow.   Expected Discharge Date:                  Expected Discharge Plan:  Homeless Shelter  In-House Referral:  Clinical Social Work  Discharge planning Services  CM Consult  Post Acute Care Choice:  NA Choice offered to:  NA  DME Arranged:  N/A DME Agency:  NA  HH Arranged:  NA HH Agency:  NA  Status of Service:  In process, will continue to follow  If discussed at Long Length of Stay Meetings, dates discussed:    Additional Comments: 06/11/18 @ 1606-[name] RNCM-Patient is refusing ST SNF; still requiring O2 with desaturation during ambulation. CM team will follow to arrange recommended services.   Midge Minium RN, BSN, NCM-BC, ACM-RN [phone] 06/10/2018, 12:34 PM

## 2018-06-10 NOTE — NC FL2 (Signed)
[place] LEVEL OF CARE SCREENING TOOL     IDENTIFICATION  Patient Name: Kim [name] Birthdate: 1955/11/11 Sex: female Admission Date (Current Location): 06/04/2018  [facility] and [place] Number:  Herbalist and Address:  [facility] [place], [place] [address] [place], [place] [address]      Provider Number: [id number]  Attending Physician Name and Address:  Cherene Altes, MD  Relative Name and Phone Number:       Current Level of Care: Hospital Recommended Level of Care: [place] Prior Approval Number:    Date Approved/Denied:   PASRR Number: pending  Discharge Plan: SNF    Current Diagnoses: Patient Active Problem List   Diagnosis Date Noted  . Endotracheally intubated   . History of ETT   . Cardiac arrest ([place]) 06/04/2018  . COPD with emphysema ([place]) 04/07/2014  . Acute on chronic diastolic heart failure ([place]) 03/18/2014  . DVT, popliteal, acute ([place]) 03/18/2014  . Tobacco abuse 03/18/2014  . Coronary artery disease, non-occlusive 02/10/2014  . Angina, class III - related to Acute Diastolic HF & COPD Exacerbation; Non-obstructive CAD 02/09/2014  . Hyperlipidemia 08/28/2008    Orientation RESPIRATION BLADDER Height & Weight     Self, Time, Situation, Place  O2(Nasal Cannula 2L) Continent Weight: 151 lb 0.2 oz (68.5 kg) Height:  5\' 5"  (165.1 cm)  BEHAVIORAL SYMPTOMS/MOOD NEUROLOGICAL BOWEL NUTRITION STATUS      Continent Diet(DYS 2 diet, thin liquids)  AMBULATORY STATUS COMMUNICATION OF NEEDS Skin   Limited Assist Verbally Normal                       Personal Care Assistance Level of Assistance  Bathing, Feeding, Dressing Bathing Assistance: Limited assistance Feeding assistance: Independent Dressing Assistance: Limited assistance     Functional Limitations Info  Speech, Hearing, Sight Sight Info: Adequate Hearing Info: Adequate Speech Info: Adequate    SPECIAL CARE FACTORS FREQUENCY   PT (By licensed PT), OT (By licensed OT)     PT Frequency: 2x OT Frequency: 2x            Contractures Contractures Info: Not present    Additional Factors Info  Code Status, Allergies Code Status Info: Full Code Allergies Info: Asa Aspirin, Metformin And Related, Penicillins, Quetiapine, Acetaminophen           Current Medications (06/10/2018):  This is the current hospital active medication list Current Facility-Administered Medications  Medication Dose Route Frequency Provider Last Rate Last Dose  . 0.9 %  sodium chloride infusion  250 mL Intravenous Continuous Minor, Grace Bushy, NP 10 mL/hr at 06/08/18 1800    . bisacodyl (DULCOLAX) suppository 10 mg  10 mg Rectal Daily PRN Juanito Doom, MD   10 mg at 06/08/18 1259  . chlorhexidine (PERIDEX) 0.12 % solution 15 mL  15 mL Mouth Rinse BID Rush Farmer, MD   15 mL at 06/10/18 0913  . Chlorhexidine Gluconate Cloth 2 % PADS 6 each  6 each Topical Daily Rush Farmer, MD   6 each at 06/09/18 [phone]  . enoxaparin (LOVENOX) injection 40 mg  40 mg Subcutaneous Q24H Carney, Gay Filler, RPH   40 mg at 06/10/18 0913  . fluticasone furoate-vilanterol (BREO ELLIPTA) 200-25 MCG/INH 1 puff  1 puff Inhalation Daily Cherene Altes, MD   1 puff at 06/10/18 1104  . folic acid (FOLVITE) tablet 1 mg  1 mg Oral Daily Lyndee Leo,  RPH   1 mg at 06/10/18 0913  . Influenza vac split quadrivalent PF (FLUARIX) injection 0.5 mL  0.5 mL Intramuscular Prior to discharge Rush Farmer, MD      . ipratropium-albuterol (DUONEB) 0.5-2.5 (3) MG/3ML nebulizer solution 3 mL  3 mL Nebulization Q6H PRN Seawell, Jaimie A, DO      . magnesium sulfate IVPB 4 g 100 mL  4 g Intravenous Once Joette Catching T, MD 50 mL/hr at 06/10/18 1120 4 g at 06/10/18 1120  . MEDLINE mouth rinse  15 mL Mouth Rinse q12n4p Rush Farmer, MD   15 mL at 06/09/18 1637  . sodium chloride flush (NS) 0.9 % injection 10-40 mL  10-40 mL Intracatheter Q12H Rush Farmer, MD    3 mL at 06/10/18 0916  . sodium chloride flush (NS) 0.9 % injection 10-40 mL  10-40 mL Intracatheter PRN Rush Farmer, MD      . thiamine (VITAMIN B-1) tablet 100 mg  100 mg Oral Daily Lyndee Leo, RPH   100 mg at 06/10/18 0913  . umeclidinium bromide (INCRUSE ELLIPTA) 62.5 MCG/INH 1 puff  1 puff Inhalation Daily Cherene Altes, MD   1 puff at 06/10/18 1104     Discharge Medications: Please see discharge summary for a list of discharge medications.  Relevant Imaging Results:  Relevant Lab Results:   Additional Information SSN: [id number]  Eileen Stanford, LCSW

## 2018-06-10 NOTE — Clinical Social Work Note (Signed)
Clinical Social Work Assessment  Patient Details  Name: Kim [name] MRN: [id number] Date of Birth: Oct 09, 1955  Date of referral:  06/10/18               Reason for consult:  Housing Concerns/Homelessness, Facility Placement                Permission sought to share information with:  Family Supports Permission granted to share information::  Yes, Verbal Permission Granted  Name::     Sports administrator::     Relationship::  Friend  Contact Information:  [phone]  Housing/Transportation Living arrangements for the past 2 months:  [place] of Information:  Patient Patient Interpreter Needed:  None Criminal Activity/Legal Involvement Pertinent to Current Situation/Hospitalization:  No - Comment as needed Significant Relationships:  Friend Lives with:  Self Do you feel safe going back to the place where you live?  Yes Need for family participation in patient care:  No (Coment)  Care giving concerns:  Pt is alert and oriented. NO family or friends present at bedside.   Social Worker assessment / plan:  Pt states she is from [facility] (shelter). Pt states her plan is to return to [facility] and get back to work. 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 Pt states she called [facility] and they told her she just needed to bring d/c summary with her when she returns. However, PT is recommending SNF. CSW explained SNF process to pt. She is on the fence about whether she will go or not. CSW provided pt with a list to look over. Pt has transfer orders--CSW on unit will follow up with chioce and to determine if pt will be agreeable to go to SNF.   Pt was unsure about how she got to the hospital and was also concerned about car. CSW determined that pt was brought in by EMS. Pt states her car must be at [facility].    Employment status:  Part-Time Nurse, adult PT Recommendations:  [place] / Referral to community  resources:  [place]  Patient/Family's Response to care:  Pt verbalized understanding of CSW role and expressed appreciation for support. Pt denies any concern regarding pt care at this time.   Patient/Family's Understanding of and Emotional Response to Diagnosis, Current Treatment, and Prognosis:  Pt unrealistic regarding physical limitations--stating I was going to go back to work, referring to d/c plan.  Pt has not yet agreed to SNF  at d/c. Pt's responses emotionally appropriate during conversation with CSW. Pt denies any concern regarding treatment plan at this time. CSW will continue to provide support and facilitate d/c needs.   Emotional Assessment Appearance:  Appears stated age Attitude/Demeanor/Rapport:  (Patient was appropriate) Affect (typically observed):  Accepting, Appropriate, Calm Orientation:  Oriented to Self, Oriented to Place, Oriented to  Time, Oriented to Situation Alcohol / Substance use:  Not Applicable Psych involvement (Current and /or in the community):  No (Comment)  Discharge Needs  Concerns to be addressed:  Basic Needs, Care Coordination Readmission within the last 30 days:  No Current discharge risk:  Dependent with Mobility Barriers to Discharge:  Continued Medical Work up   [employer], LCSW 06/10/2018, 12:28 PM

## 2018-06-10 NOTE — Progress Notes (Signed)
[place] TEAM 1 - Stepdown/ICU TEAM  JAYRA CHOYCE  ZOX:[id number] DOB: 02-Jan-1956 DOA: 06/04/2018 PCP: [name], FNP    Brief Narrative:  67 female w/ a hx of COPD, HLD, HTN, DM, DVT, and schizophrenia who was admitted 06/04/2018 post cardiac arrest, PCCM consulted for intubation s/p ROSC, found to have multifocal pneumonia and COPD exacerbation.   Significant Events: 1/14 admit for cardiac arrest - L IJ CVL  1/14 TTE - EF 50-55% - RV mod dilated - no WMA - grade 1 DD 1/17 extubated 1/20 TRH assumed care   Subjective: The patient is very impulsive with apparent limited understanding of her current illness.  She reports chest wall tenderness and ongoing nagging cough.  She denies significant shortness of breath at rest and has made multiple attempts to take her nasal cannula oxygen off despite being redirected by the staff.  She reports a fair appetite.  Assessment & Plan:  Asystolic arrest Appears to have been a primary resp event   H influenzae Multifocal Pneumonia + Enterovirus/Rhinovirus URI  cont. ceftriaxone through today to complete 7 days of treatment - will need f/u CT 3-4 weeks once pneumonia resolved due to nodular opacities  Hypercarbic Respiratory Failure - COPD cont brovana, pulmicort -appears she may need supplemental oxygen as outpatient -continuing attempts to wean to room air  Hyponatremia Much improved w/ volume expansion -reassess in a.m.  Hypomagnesemia Supplement to goal of 2.0  DM Reportedly diet controlled - CBG controlled presently - check A1c  Schizophrenia She has not been taking her risperdal prior to admission as it makes her drowsy -following without medical therapy for now  Issues for Long Term Follow-up: -need CT chest in 3-4 weeks to f/u nodular opacities (infectious v/s other)  DVT prophylaxis: Lovenox Code Status: FULL CODE Family Communication: no family present at time of exam  Disposition Plan: Stable for transfer to  telemetry bed -appears she will need SNF for discharge  Consultants:  PCCM  Antimicrobials:  1/14 vancomycin >> 1/15 1/14 cefepime >> 1/17 1/17 ceftriaxone >>  1/20  Objective: Blood pressure (!) 128/103, pulse 79, temperature (!) 96.3 F (35.7 C), temperature source Axillary, resp. rate (!) 22, height 5\' 5"  (1.651 m), weight 68.5 kg, SpO2 95 %.  Intake/Output Summary (Last 24 hours) at 06/10/2018 0939 Last data filed at 06/10/2018 0800 Gross per 24 hour  Intake 564.48 ml  Output 500 ml  Net 64.48 ml   Filed Weights   06/08/18 0500 06/09/18 0435 06/10/18 0307  Weight: 70.6 kg 68.5 kg 68.5 kg    Examination: General: No acute respiratory distress at rest and chair Lungs: Coarse upper airway sounds and generalized crackles appreciable on exam Cardiovascular: Regular rate and rhythm without murmur gallop or rub normal S1 and S2 Abdomen: Nontender, nondistended, soft, bowel sounds positive, no rebound, no ascites, no appreciable mass Extremities: No significant cyanosis, clubbing, or edema bilateral lower extremities  CBC: Recent Labs  Lab 06/08/18 0212 06/09/18 0427 06/10/18 0510  WBC 17.2* 11.6* 12.0*  NEUTROABS  --  9.0*  --   HGB 15.0 14.7 15.3*  HCT 47.4* 45.9 45.5  MCV 96.7 95.8 95.0  PLT 197 190 811   Basic Metabolic Panel: Recent Labs  Lab 06/07/18 1617 06/08/18 0212 06/09/18 0427 06/10/18 0510  NA  --  133* 132* 132*  K  --  3.2* 3.5 3.5  CL  --  81* 88* 91*  CO2  --  41* 35* 32  GLUCOSE  --  105*  95 95  BUN  --  15 15 13   CREATININE  --  0.72 0.63 0.49  CALCIUM  --  9.7 9.7 9.7  MG 1.5* 1.5* 1.8 1.5*  PHOS 4.5 4.4  --  2.8   GFR: Estimated Creatinine Clearance: 70 mL/min (by C-G formula based on SCr of 0.49 mg/dL).  Liver Function Tests: Recent Labs  Lab 06/04/18 1051 06/08/18 0212  AST 40 13*  ALT 28 20  ALKPHOS 84 79  BILITOT 1.3* 0.7  PROT 4.8* 6.1*  ALBUMIN 2.2* 2.5*   Recent Labs  Lab 06/04/18 1051  LIPASE 18  AMYLASE 195*     Coagulation Profile: Recent Labs  Lab 06/04/18 0652 06/04/18 1142  INR 1.39 1.70    Cardiac Enzymes: Recent Labs  Lab 06/04/18 1051 06/04/18 1306 06/04/18 1950  TROPONINI 0.11* 0.16* 0.11*    HbA1C: Hgb A1c MFr Bld  Date/Time Value Ref Range Status  03/13/2018 11:56 AM 5.7 (H) 4.8 - 5.6 % Final    Comment:             Prediabetes: 5.7 - 6.4          Diabetes: >6.4          Glycemic control for adults with diabetes: <7.0     CBG: Recent Labs  Lab 06/09/18 1605 06/09/18 2019 06/09/18 2358 06/10/18 0400 06/10/18 0814  GLUCAP 128* 87 89 87 107*    Recent Results (from the past 240 hour(s))  Culture, blood (routine x 2)     Status: None   Collection Time: 06/04/18  9:27 AM  Result Value Ref Range Status   Specimen Description BLOOD LEFT HAND  Final   Special Requests   Final    BOTTLES DRAWN AEROBIC ONLY Blood Culture results may not be optimal due to an inadequate volume of blood received in culture bottles   Culture   Final    NO GROWTH 5 DAYS Performed at [facility], [place] [address], [place], [place] [address]    Report Status 06/09/2018 FINAL  Final  Culture, blood (routine x 2)     Status: None   Collection Time: 06/04/18 11:00 AM  Result Value Ref Range Status   Specimen Description BLOOD LEFT HAND  Final   Special Requests   Final    BOTTLES DRAWN AEROBIC AND ANAEROBIC Blood Culture adequate volume   Culture   Final    NO GROWTH 5 DAYS Performed at [facility], [place] [address] [place], [place] [address]    Report Status 06/09/2018 FINAL  Final  Urine culture     Status: None   Collection Time: 06/04/18 11:34 AM  Result Value Ref Range Status   Specimen Description URINE, RANDOM  Final   Special Requests NONE  Final   Culture   Final    NO GROWTH Performed at [facility], [place] [address], [place], [place] [address]    Report Status 06/05/2018 FINAL  Final  Culture, respiratory (tracheal aspirate)     Status: None    Collection Time: 06/04/18 11:34 AM  Result Value Ref Range Status   Specimen Description TRACHEAL ASPIRATE  Final   Special Requests NONE  Final   Gram Stain   Final    ABUNDANT WBC PRESENT, PREDOMINANTLY PMN MODERATE GRAM NEGATIVE RODS    Culture   Final    MODERATE HAEMOPHILUS INFLUENZAE BETA LACTAMASE NEGATIVE Performed at [facility], [place] [address], [place], [place]  [address]    Report Status 06/06/2018 FINAL  Final  MRSA PCR Screening     Status: None   Collection Time: 06/04/18 11:34 AM  Result Value Ref Range Status   MRSA by PCR NEGATIVE NEGATIVE Final    Comment:        The GeneXpert MRSA Assay (FDA approved for NASAL specimens only), is one component of a comprehensive MRSA colonization surveillance program. It is not intended to diagnose MRSA infection nor to guide or monitor treatment for MRSA infections. Performed at [facility], [place] [address], [place], [place] [address]   Respiratory Panel by PCR     Status: Abnormal   Collection Time: 06/04/18  2:24 PM  Result Value Ref Range Status   Adenovirus NOT DETECTED NOT DETECTED Final   Coronavirus 229E NOT DETECTED NOT DETECTED Final   Coronavirus HKU1 NOT DETECTED NOT DETECTED Final   Coronavirus NL63 NOT DETECTED NOT DETECTED Final   Coronavirus OC43 NOT DETECTED NOT DETECTED Final   Metapneumovirus NOT DETECTED NOT DETECTED Final   Rhinovirus / Enterovirus DETECTED (A) NOT DETECTED Final   Influenza A NOT DETECTED NOT DETECTED Final   Influenza B NOT DETECTED NOT DETECTED Final   Parainfluenza Virus 1 NOT DETECTED NOT DETECTED Final   Parainfluenza Virus 2 NOT DETECTED NOT DETECTED Final   Parainfluenza Virus 3 NOT DETECTED NOT DETECTED Final   Parainfluenza Virus 4 NOT DETECTED NOT DETECTED Final   Respiratory Syncytial Virus NOT DETECTED NOT DETECTED Final   Bordetella pertussis NOT DETECTED NOT DETECTED Final   Chlamydophila pneumoniae NOT DETECTED NOT DETECTED Final   Mycoplasma  pneumoniae NOT DETECTED NOT DETECTED Final    Comment: Performed at [facility], [place]. [address], [place],  [address]     Scheduled Meds: . arformoterol  15 mcg Nebulization BID  . budesonide (PULMICORT) nebulizer solution  0.5 mg Nebulization BID  . chlorhexidine  15 mL Mouth Rinse BID  . Chlorhexidine Gluconate Cloth  6 each Topical Daily  . enoxaparin (LOVENOX) injection  40 mg Subcutaneous Q24H  . folic acid  1 mg Oral Daily  . insulin aspart  0-9 Units Subcutaneous Q4H  . mouth rinse  15 mL Mouth Rinse q12n4p  . sodium chloride flush  10-40 mL Intracatheter Q12H  . thiamine  100 mg Oral Daily   Continuous Infusions: . sodium chloride 10 mL/hr at 06/08/18 1800  . cefTRIAXone (ROCEPHIN)  IV 2 g (06/10/18 0848)     LOS: 6 days   Cherene Altes, MD [facility] Office  [phone] Pager - Text Page per Amion  If 7PM-7AM, please contact night-coverage per Amion 06/10/2018, 9:39 AM

## 2018-06-11 ENCOUNTER — Telehealth: Payer: Self-pay | Admitting: Nurse Practitioner

## 2018-06-11 DIAGNOSIS — F068 Other specified mental disorders due to known physiological condition: Secondary | ICD-10-CM | POA: Diagnosis not present

## 2018-06-11 LAB — CBC
HCT: 44.9 % (ref 36.0–46.0)
Hemoglobin: 15 g/dL (ref 12.0–15.0)
MCH: 31.2 pg (ref 26.0–34.0)
MCHC: 33.4 g/dL (ref 30.0–36.0)
MCV: 93.3 fL (ref 80.0–100.0)
Platelets: 255 10*3/uL (ref 150–400)
RBC: 4.81 MIL/uL (ref 3.87–5.11)
RDW: 12.7 % (ref 11.5–15.5)
WBC: 12.7 10*3/uL — ABNORMAL HIGH (ref 4.0–10.5)
nRBC: 0 % (ref 0.0–0.2)

## 2018-06-11 LAB — BASIC METABOLIC PANEL
Anion gap: 8 (ref 5–15)
BUN: 11 mg/dL (ref 8–23)
CO2: 33 mmol/L — ABNORMAL HIGH (ref 22–32)
Calcium: 9.7 mg/dL (ref 8.9–10.3)
Chloride: 92 mmol/L — ABNORMAL LOW (ref 98–111)
Creatinine, Ser: 0.6 mg/dL (ref 0.44–1.00)
GFR calc Af Amer: >60 mL/min (ref >60)
GFR calc non Af Amer: >60 mL/min (ref >60)
GLUCOSE: 108 mg/dL — AB (ref 70–99)
Potassium: 3.7 mmol/L (ref 3.5–5.1)
Sodium: 133 mmol/L — ABNORMAL LOW (ref 135–145)

## 2018-06-11 LAB — MAGNESIUM: Magnesium: 1.8 mg/dL (ref 1.7–2.4)

## 2018-06-11 LAB — HEMOGLOBIN A1C
Hgb A1c MFr Bld: 6.5 % — ABNORMAL HIGH (ref 4.8–5.6)
MEAN PLASMA GLUCOSE: 139.85 mg/dL

## 2018-06-11 MED ORDER — KETOROLAC TROMETHAMINE 15 MG/ML IJ SOLN: 15.0000 mg | Freq: Four times a day (QID) | INTRAMUSCULAR | Status: DC | PRN

## 2018-06-11 NOTE — Progress Notes (Signed)
   06/11/18 1230  Mobility  Activity Ambulated in hall;Dangled on edge of bed  Range of Motion Active;All extremities  Level of Assistance Modified independent, requires aide device or extra time  Assistive Device Front wheel walker  Minutes Stood 5 minutes  Minutes Ambulated 5 minutes  Distance Ambulated (ft) 960 ft  Mobility Response Tolerated well  Bed Position Semi-fowlers  SATURATION QUALIFICATIONS: (This note is used to comply with regulatory documentation for home oxygen)  Patient Saturations on Room Air at Rest = 88%  Patient Saturations on Room Air while Ambulating = %  Patient Saturations on 3 Liters of oxygen while Ambulating = 97%  Please briefly explain why patient needs home oxygen: Patients oxygen saturation down to 88% on room air before ambulation. During ambulation on 2L of oxygen, desaturated to the 80's and needed 3 liters of oxygen to sustain in the 90's.

## 2018-06-11 NOTE — Progress Notes (Signed)
Patient has ambulated in the hallway down to room 1 and back, O2 needs increase D/T sats drop into the low 80's on 4L N/C at beginning of ambulation, needed to tirtate to 6L N/C to keep sats at or greater than 88%.

## 2018-06-11 NOTE — Progress Notes (Signed)
  Speech Language Pathology Treatment: Dysphagia  Patient Details Name: Kim [name] MRN: [id number] DOB: 1956-05-04 Today's Date: 06/11/2018 Time: 9507-2257 SLP Time Calculation (min) (ACUTE ONLY): 16 min  Assessment / Plan / Recommendation Clinical Impression  Pt has longer utterances today and reports feeling near baseline for voicing and breath support for speech production. She consumed advanced trials of soft solids/mixed consistencies without overt difficulty, although she did decline any type of cracker, stating that they were too hard to swallow. She does admit to avoiding crackers at baseline, but she also acknowledges that she was able to have other regular textures PTA. Recommend advancement to Dys 3 diet, thin liquids. Aspiration precautions were reinforced with pt.   HPI HPI: 40 female admitted 06/04/2018 post cardiac arrest, CCM consulted for intubation s/p rosc, found to have multifocal pneumonia and COPD exacerbation. Intubated 1/14-1/17/20. On BiPAP overnight, high risk for reintubation per MD notes. PMHx: COPD, Tobacco abuse, Schizophrenia, CAD, HLD, DVT, GERD, DM2, CHF.      SLP Plan  Continue with current plan of care       Recommendations  Diet recommendations: Dysphagia 3 (mechanical soft);Thin liquid Liquids provided via: Cup;Straw Medication Administration: Whole meds with puree Supervision: Patient able to self feed;Full supervision/cueing for compensatory strategies Compensations: Slow rate;Small sips/bites Postural Changes and/or Swallow Maneuvers: Seated upright 90 degrees;Upright 30-60 min after meal                Oral Care Recommendations: Oral care BID Follow up Recommendations: Skilled Nursing facility SLP Visit Diagnosis: Dysphagia, unspecified (R13.10) Plan: Continue with current plan of care       GO                Germain Osgood 06/11/2018, 11:54 AM  Germain Osgood, M.A. [place] Acute Environmental education officer  [phone] Office [phone]

## 2018-06-11 NOTE — Telephone Encounter (Signed)
Called to schedule Medicare Annual Wellness Visit with the Nurse Health Advisor. Voicemail box is full.  If patient returns call, please note: their last AWV was on 12/11/, 18 please schedule AWV with NHA any date AFTER 05/01/2018  Thank you! For any questions please contact: Janace Hoard at [phone] or Skype [email or web address].

## 2018-06-11 NOTE — Progress Notes (Signed)
Physical Therapy Treatment Patient Details Name: Kim [name] MRN: [id number] DOB: [birth year] Today's Date: 06/11/2018    History of Present Illness 26 female admitted 06/04/2018 post cardiac arrest, CCM consulted for intubation s/p rosc, found to have multifocal pneumonia and COPD exacerbation. Extubated 1/17 in am.      PT Comments    Pt making excellent progress with mobility. Still requiring supplemental O2. Unsure if pt able to manage her needs with medical issues at homeless shelter. Continue to recommend SNF. Will work toward higher level balance activities and to progress away from assistive device.    Follow Up Recommendations  SNF;Supervision/Assistance - 24 hour     Equipment Recommendations  Other (comment)(rollator)    Recommendations for Other Services       Precautions / Restrictions Precautions Precautions: Fall Precaution Comments: wacth O2 saturations Restrictions Weight Bearing Restrictions: No    Mobility  Bed Mobility               General bed mobility comments: pt sitting EOB upon arrival  Transfers Overall transfer level: Needs assistance Equipment used: 4-wheeled walker Transfers: Sit to/from Stand Sit to Stand: Min guard         General transfer comment: Min guard for safety  Ambulation/Gait Ambulation/Gait assistance: Min guard Gait Distance (Feet): 800 Feet Assistive device: 4-wheeled walker Gait Pattern/deviations: Step-through pattern;Decreased stride length Gait velocity: decreased Gait velocity interpretation: >4.37 ft/sec, indicative of normal walking speed General Gait Details: Assist for safety and to manage O2. Pt amb on 3L initially with SpO2 88-89%. Incr O2 to 4L with SpO2 at least 92%.    Stairs             Wheelchair Mobility    Modified Rankin (Stroke Patients Only)       Balance Overall balance assessment: Needs assistance Sitting-balance support: No upper extremity supported;Feet  supported Sitting balance-Leahy Scale: Good     Standing balance support: No upper extremity supported;During functional activity Standing balance-Leahy Scale: Fair                              Cognition Arousal/Alertness: Awake/alert Behavior During Therapy: Flat affect Overall Cognitive Status: Within Functional Limits for tasks assessed Area of Impairment: Attention;Safety/judgement                                      Exercises      General Comments        Pertinent Vitals/Pain Pain Assessment: No/denies pain Pain Score: 0-No pain Pain Intervention(s): Monitored during session    Home Living Family/patient expects to be discharged to:: Shelter/Homeless                    Prior Function Level of Independence: Independent          PT Goals (current goals can now be found in the care plan section) Acute Rehab PT Goals PT Goal Formulation: With patient Time For Goal Achievement: 06/18/18 Potential to Achieve Goals: Good Progress towards PT goals: Goals met and updated - see care plan    Frequency    Min 3X/week      PT Plan Current plan remains appropriate    Co-evaluation              AM-PAC PT "6 Clicks" Mobility   Outcome Measure  Help needed turning from  your back to your side while in a flat bed without using bedrails?: None Help needed moving from lying on your back to sitting on the side of a flat bed without using bedrails?: None Help needed moving to and from a bed to a chair (including a wheelchair)?: A Little Help needed standing up from a chair using your arms (e.g., wheelchair or bedside chair)?: A Little Help needed to walk in hospital room?: A Little Help needed climbing 3-5 steps with a railing? : A Little 6 Click Score: 20    End of Session Equipment Utilized During Treatment: Oxygen Activity Tolerance: Patient tolerated treatment well Patient left: in bed;with call bell/phone within  reach(sitting EOB) Nurse Communication: Mobility status PT Visit Diagnosis: Muscle weakness (generalized) (M62.81)     Time: 4431-5400 PT Time Calculation (min) (ACUTE ONLY): 18 min  Charges:  $Gait Training: 8-22 mins                     [place] Pager [phone] Office [place] 06/11/2018, 3:01 PM

## 2018-06-11 NOTE — Progress Notes (Signed)
Occupational Therapy Treatment Patient Details Name: Kim [name] MRN: [id number] DOB: 29-Mar-1956 Today's Date: 06/11/2018    History of present illness 8 female admitted 06/04/2018 post cardiac arrest, CCM consulted for intubation s/p rosc, found to have multifocal pneumonia and COPD exacerbation. Extubated 1/17 in am.     OT comments  Pt making progress with functional goals. Pt participated in ADL/selcare activity seated and standing at sink, toilet transfers and toileting tasks with min guard A. Pt's O2 SATs remained >90% throughout activity. OT will continue to follow acutely  Follow Up Recommendations  SNF;Supervision/Assistance - 24 hour    Equipment Recommendations  Other (comment)(TBD at next venue of care)    Recommendations for Other Services      Precautions / Restrictions Precautions Precautions: Fall Precaution Comments: wacth O2 saturations Restrictions Weight Bearing Restrictions: No       Mobility Bed Mobility               General bed mobility comments: pt sitting EOB upon arrival  Transfers Overall transfer level: Needs assistance Equipment used: Rolling walker (2 wheeled) Transfers: Sit to/from Stand Sit to Stand: Min guard         General transfer comment: Min guard for safety    Balance Overall balance assessment: Needs assistance Sitting-balance support: Feet supported;No upper extremity supported Sitting balance-Leahy Scale: Good     Standing balance support: Bilateral upper extremity supported;During functional activity Standing balance-Leahy Scale: Fair                             ADL either performed or assessed with clinical judgement   ADL Overall ADL's : Needs assistance/impaired     Grooming: Min guard;Standing;Wash/dry hands;Wash/dry face   Upper Body Bathing: Set up;Sitting   Lower Body Bathing: Minimal assistance;Sit to/from stand Lower Body Bathing Details (indicate cue type and reason):  simulated Upper Body Dressing : Set up;Sitting   Lower Body Dressing: Minimal assistance   Toilet Transfer: RW;Min guard;Comfort height toilet;Grab bars;Ambulation   Toileting- Clothing Manipulation and Hygiene: Sit to/from stand;Min guard       Functional mobility during ADLs: Min guard;Rolling walker General ADL Comments: decreased activity tolerance, decreased safety awareness     Vision Patient Visual Report: No change from baseline     Perception     Praxis      Cognition Arousal/Alertness: Awake/alert Behavior During Therapy: Flat affect Overall Cognitive Status: Within Functional Limits for tasks assessed Area of Impairment: Attention;Safety/judgement                                        Exercises     Shoulder Instructions       General Comments      Pertinent Vitals/ Pain       Pain Assessment: No/denies pain Pain Score: 0-No pain Faces Pain Scale: No hurt Pain Intervention(s): Monitored during session  Home Living Family/patient expects to be discharged to:: Shelter/Homeless                                        Prior Functioning/Environment Level of Independence: Independent            Frequency  Min 2X/week        Progress Toward Goals  OT  Goals(current goals can now be found in the care plan section)  Progress towards OT goals: Progressing toward goals     Plan Discharge plan remains appropriate    Co-evaluation                 AM-PAC OT "6 Clicks" Daily Activity     Outcome Measure   Help from another person eating meals?: None Help from another person taking care of personal grooming?: A Little Help from another person toileting, which includes using toliet, bedpan, or urinal?: A Little Help from another person bathing (including washing, rinsing, drying)?: A Little Help from another person to put on and taking off regular upper body clothing?: A Little Help from another person to  put on and taking off regular lower body clothing?: A Little 6 Click Score: 19    End of Session Equipment Utilized During Treatment: Gait belt;Oxygen;Rolling walker  OT Visit Diagnosis: Unsteadiness on feet (R26.81);Other abnormalities of gait and mobility (R26.89);Muscle weakness (generalized) (M62.81);Other symptoms and signs involving cognitive function   Activity Tolerance Patient tolerated treatment well   Patient Left with call bell/phone within reach;in bed(sitting EOB)   Nurse Communication          Time: 2481-8590 OT Time Calculation (min): 24 min  Charges: OT General Charges $OT Visit: 1 Visit OT Treatments $Self Care/Home Management : 8-22 mins $Therapeutic Activity: 8-22 mins     Britt Bottom 06/11/2018, 2:04 PM

## 2018-06-11 NOTE — Progress Notes (Signed)
SATURATION QUALIFICATIONS: (This note is used to comply with regulatory documentation for home oxygen)  Patient Saturations on Room Air at Rest = 86%  Patient Saturations on Room Air while Ambulating = N/A  Patient Saturations on 4 Liters of oxygen while Ambulating = 92%  Please briefly explain why patient needs home oxygen: Pt unable to maintain adequate oxygenation without supplemental O2 at rest and requires 4L with activity to maintain adequate oxygenation.   [place] Pager [phone] Office [phone]

## 2018-06-11 NOTE — Progress Notes (Signed)
PROGRESS NOTE    Kim [name]  NKN:[id number] DOB: [birth year] DOA: 06/04/2018 PCP: [name], FNP     Brief Narrative:  Kim [name] is a 63 year old female with past medical history significant for COPD, hyperlipidemia, hypertension, diabetes, DVT, schizophrenia who was admitted on 06/04/2018 status post cardiac arrest.  She lives in a homeless shelter, went outside to smoke with her friends when they noted her to be short of breath, she collapsed to the ground.  EMS was activated, she did receive CPR, 2 rounds of epinephrine with return of spontaneous circulation within 2 minutes. She was admitted to the ICU, was intubated, and had return of spontaneous circulation.  She was found to have multifocal pneumonia.  Significant events: 1/14 admitted for cardiac arrest 1/14 echocardiogram EF 50 to 37%, grade 1 diastolic dysfunction 9/02 patient extubated 1/20 [facility] assumed care   New events last 24 hours / Subjective: No acute events today.  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  She has some chest pain that worsens with cough.  Denies any significant shortness of breath.  Assessment & Plan:   Active Problems:   Cardiac arrest [facility])   Endotracheally intubated   History of ETT   Asystolic cardiac arrest Has been intubated and extubated, treated in the ICU.  Appears to have been a primary respiratory event  H influenza multifocal pneumonia plus enterovirus/rhinovirus URI Completed 7-day treatments of Rocephin Blood cultures negative  will need follow-up CT in 3 to 4 weeks once pneumonia resolves due to nodular opacities  Acute hypoxemic and hypercarbic respiratory failure Continue to wean nasal cannula O2 to room air  Schizophrenia Patient has not been taking her risperidone prior to admission as it makes her drowsy  Mild hyponatremia Remains stable at this time  Type 2 diabetes  mellitus, well controlled Hemoglobin A1c 6.5  Chest pain, musculoskeletal Likely secondary to CPR, Toradol ordered    DVT prophylaxis: Lovenox Code Status: Full code Family Communication: No family at bedside Disposition Plan: Skilled nursing facility recommended, discussed with patient.  She is in agreement at this time.   Consultants:   PCCM   Antimicrobials:  Anti-infectives (From admission, onward)   Start     Dose/Rate Route Frequency Ordered Stop   06/08/18 0800  cefTRIAXone (ROCEPHIN) 2 g in sodium chloride 0.9 % 100 mL IVPB     2 g 200 mL/hr over 30 Minutes Intravenous Every 24 hours 06/07/18 2246 06/10/18 0921   06/07/18 0715  cefTRIAXone (ROCEPHIN) 2 g in sodium chloride 0.9 % 100 mL IVPB  Status:  Discontinued     2 g 200 mL/hr over 30 Minutes Intravenous Every 24 hours 06/07/18 0706 06/07/18 2246   06/05/18 1200  vancomycin (VANCOCIN) 1,250 mg in sodium chloride 0.9 % 250 mL IVPB  Status:  Discontinued     1,250 mg 166.7 mL/hr over 90 Minutes Intravenous Every 24 hours 06/04/18 0935 06/05/18 1050   06/05/18 1000  ceFEPIme (MAXIPIME) 2 g in sodium chloride 0.9 % 100 mL IVPB  Status:  Discontinued     2 g 200 mL/hr over 30 Minutes Intravenous Every 12 hours 06/05/18 0807 06/07/18 0713   06/04/18 2200  ceFEPIme (MAXIPIME) 1 g in sodium chloride 0.9 % 100 mL IVPB  Status:  Discontinued     1 g 200 mL/hr over 30 Minutes Intravenous Every 12 hours 06/04/18 1512 06/05/18 0807  06/04/18 0930  vancomycin (VANCOCIN) 1,500 mg in sodium chloride 0.9 % 500 mL IVPB     1,500 mg 250 mL/hr over 120 Minutes Intravenous  Once 06/04/18 0917 06/04/18 1425   06/04/18 0930  ceFEPIme (MAXIPIME) 1 g in sodium chloride 0.9 % 100 mL IVPB  Status:  Discontinued     1 g 200 mL/hr over 30 Minutes Intravenous Every 24 hours 06/04/18 0917 06/04/18 1512        Objective: Vitals:   06/10/18 1533 06/10/18 2057 06/11/18 0303 06/11/18 0457  BP: (!) 124/93 117/87  126/89  Pulse:  64  74    Resp:  (!) 23    Temp:  98 F (36.7 C)  98.1 F (36.7 C)  TempSrc:  Oral  Oral  SpO2:  91%  94%  Weight:   68.9 kg   Height:        Intake/Output Summary (Last 24 hours) at 06/11/2018 1209 Last data filed at 06/11/2018 0827 Gross per 24 hour  Intake 1058 ml  Output 900 ml  Net 158 ml   Filed Weights   06/09/18 0435 06/10/18 0307 06/11/18 0303  Weight: 68.5 kg 68.5 kg 68.9 kg    Examination:  General exam: Appears calm and comfortable  Respiratory system: Clear to auscultation. Respiratory effort normal. Cardiovascular system: S1 & S2 heard, RRR. No JVD, murmurs, rubs, gallops or clicks. No pedal edema. Gastrointestinal system: Abdomen is nondistended, soft and nontender. No organomegaly or masses felt. Normal bowel sounds heard. Central nervous system: Alert and oriented. No focal neurological deficits. Extremities: Symmetric 5 x 5 power. Skin: No rashes, lesions or ulcers Psychiatry: Judgement and insight appear normal. Mood & affect appropriate.   Data Reviewed: I have personally reviewed following labs and imaging studies  CBC: Recent Labs  Lab 06/07/18 0300 06/08/18 0212 06/09/18 0427 06/10/18 0510 06/11/18 0418  WBC 19.6* 17.2* 11.6* 12.0* 12.7*  NEUTROABS  --   --  9.0*  --   --   HGB 13.7 15.0 14.7 15.3* 15.0  HCT 42.0 47.4* 45.9 45.5 44.9  MCV 97.2 96.7 95.8 95.0 93.3  PLT 175 197 190 242 154   Basic Metabolic Panel: Recent Labs  Lab 06/06/18 1700 06/07/18 0300 06/07/18 1617 06/08/18 0212 06/09/18 0427 06/10/18 0510 06/11/18 0418  NA  --  131*  --  133* 132* 132* 133*  K  --  4.1  --  3.2* 3.5 3.5 3.7  CL  --  92*  --  81* 88* 91* 92*  CO2  --  35*  --  41* 35* 32 33*  GLUCOSE  --  126*  --  105* 95 95 108*  BUN  --  14  --  15 15 13 11   CREATININE  --  0.68  --  0.72 0.63 0.49 0.60  CALCIUM  --  9.3  --  9.7 9.7 9.7 9.7  MG 1.8 1.8 1.5* 1.5* 1.8 1.5* 1.8  PHOS 3.1 2.6 4.5 4.4  --  2.8  --    GFR: Estimated Creatinine Clearance: 70.2  mL/min (by C-G formula based on SCr of 0.6 mg/dL). Liver Function Tests: Recent Labs  Lab 06/08/18 0212  AST 13*  ALT 20  ALKPHOS 79  BILITOT 0.7  PROT 6.1*  ALBUMIN 2.5*   No results for input(s): LIPASE, AMYLASE in the last 168 hours. No results for input(s): AMMONIA in the last 168 hours. Coagulation Profile: No results for input(s): INR, PROTIME in the last 168 hours. Cardiac  Enzymes: Recent Labs  Lab 06/04/18 1306 06/04/18 1950  TROPONINI 0.16* 0.11*   BNP (last 3 results) No results for input(s): PROBNP in the last 8760 hours. HbA1C: Recent Labs    06/11/18 0418  HGBA1C 6.5*   CBG: Recent Labs  Lab 06/09/18 1605 06/09/18 2019 06/09/18 2358 06/10/18 0400 06/10/18 0814  GLUCAP 128* 87 89 87 107*   Lipid Profile: No results for input(s): CHOL, HDL, LDLCALC, TRIG, CHOLHDL, LDLDIRECT in the last 72 hours. Thyroid Function Tests: No results for input(s): TSH, T4TOTAL, FREET4, T3FREE, THYROIDAB in the last 72 hours. Anemia Panel: No results for input(s): VITAMINB12, FOLATE, FERRITIN, TIBC, IRON, RETICCTPCT in the last 72 hours. Sepsis Labs: Recent Labs  Lab 06/05/18 0357 06/06/18 0421  PROCALCITON 0.81 0.54    Recent Results (from the past 240 hour(s))  Culture, blood (routine x 2)     Status: None   Collection Time: 06/04/18  9:27 AM  Result Value Ref Range Status   Specimen Description BLOOD LEFT HAND  Final   Special Requests   Final    BOTTLES DRAWN AEROBIC ONLY Blood Culture results may not be optimal due to an inadequate volume of blood received in culture bottles   Culture   Final    NO GROWTH 5 DAYS Performed at [place] [address] [place], [place] [address]    Report Status 06/09/2018 FINAL  Final  Culture, blood (routine x 2)     Status: None   Collection Time: 06/04/18 11:00 AM  Result Value Ref Range Status   Specimen Description BLOOD LEFT HAND  Final   Special Requests   Final    BOTTLES DRAWN AEROBIC AND ANAEROBIC  Blood Culture adequate volume   Culture   Final    NO GROWTH 5 DAYS Performed at [facility], [place] [address], [place], [place] [address]    Report Status 06/09/2018 FINAL  Final  Urine culture     Status: None   Collection Time: 06/04/18 11:34 AM  Result Value Ref Range Status   Specimen Description URINE, RANDOM  Final   Special Requests NONE  Final   Culture   Final    NO GROWTH Performed at [facility], [place] [address], [place], [place] [address]    Report Status 06/05/2018 FINAL  Final  Culture, respiratory (tracheal aspirate)     Status: None   Collection Time: 06/04/18 11:34 AM  Result Value Ref Range Status   Specimen Description TRACHEAL ASPIRATE  Final   Special Requests NONE  Final   Gram Stain   Final    ABUNDANT WBC PRESENT, PREDOMINANTLY PMN MODERATE GRAM NEGATIVE RODS    Culture   Final    MODERATE HAEMOPHILUS INFLUENZAE BETA LACTAMASE NEGATIVE Performed at [facility], [place] [address] [place], [place] [address]    Report Status 06/06/2018 FINAL  Final  MRSA PCR Screening     Status: None   Collection Time: 06/04/18 11:34 AM  Result Value Ref Range Status   MRSA by PCR NEGATIVE NEGATIVE Final    Comment:        The GeneXpert MRSA Assay (FDA approved for NASAL specimens only), is one component of a comprehensive MRSA colonization surveillance program. It is not intended to diagnose MRSA infection nor to guide or monitor treatment for MRSA infections. Performed at [facility], [place] [address], [place], [place] [address]   Respiratory Panel by PCR  Status: Abnormal   Collection Time: 06/04/18  2:24 PM  Result Value Ref Range Status   Adenovirus NOT DETECTED NOT DETECTED Final   Coronavirus 229E NOT DETECTED NOT DETECTED Final   Coronavirus HKU1 NOT DETECTED NOT DETECTED Final   Coronavirus NL63 NOT DETECTED NOT DETECTED Final   Coronavirus OC43 NOT DETECTED NOT DETECTED Final   Metapneumovirus NOT DETECTED NOT DETECTED  Final   Rhinovirus / Enterovirus DETECTED (A) NOT DETECTED Final   Influenza A NOT DETECTED NOT DETECTED Final   Influenza B NOT DETECTED NOT DETECTED Final   Parainfluenza Virus 1 NOT DETECTED NOT DETECTED Final   Parainfluenza Virus 2 NOT DETECTED NOT DETECTED Final   Parainfluenza Virus 3 NOT DETECTED NOT DETECTED Final   Parainfluenza Virus 4 NOT DETECTED NOT DETECTED Final   Respiratory Syncytial Virus NOT DETECTED NOT DETECTED Final   Bordetella pertussis NOT DETECTED NOT DETECTED Final   Chlamydophila pneumoniae NOT DETECTED NOT DETECTED Final   Mycoplasma pneumoniae NOT DETECTED NOT DETECTED Final    Comment: Performed at [facility], [place] [address] [place], [place] [address]       Radiology Studies: No results found.    Scheduled Meds: . chlorhexidine  15 mL Mouth Rinse BID  . Chlorhexidine Gluconate Cloth  6 each Topical Daily  . enoxaparin (LOVENOX) injection  40 mg Subcutaneous Q24H  . fluticasone furoate-vilanterol  1 puff Inhalation Daily  . folic acid  1 mg Oral Daily  . mouth rinse  15 mL Mouth Rinse q12n4p  . sodium chloride flush  10-40 mL Intracatheter Q12H  . thiamine  100 mg Oral Daily  . umeclidinium bromide  1 puff Inhalation Daily   Continuous Infusions: . sodium chloride 10 mL/hr at 06/08/18 1800     LOS: 7 days    Time spent: 35 minutes   Dessa Phi, DO [facility] [email or web address] 06/11/2018, 12:09 PM

## 2018-06-11 NOTE — Care Management Important Message (Signed)
Important Message  Patient Details  Name: Kim [name] MRN: [id number] Date of Birth: [birth year]   Medicare Important Message Given:  Yes    Miray Mancino P Kelty Szafran 06/11/2018, 1:41 PM

## 2018-06-12 ENCOUNTER — Encounter: Payer: Self-pay | Admitting: Critical Care Medicine

## 2018-06-12 ENCOUNTER — Other Ambulatory Visit: Payer: Self-pay | Admitting: Critical Care Medicine

## 2018-06-12 DIAGNOSIS — Z23 Encounter for immunization: Secondary | ICD-10-CM | POA: Diagnosis not present

## 2018-06-12 DIAGNOSIS — F068 Other specified mental disorders due to known physiological condition: Secondary | ICD-10-CM | POA: Diagnosis not present

## 2018-06-12 LAB — BASIC METABOLIC PANEL
Anion gap: 9 (ref 5–15)
BUN: 10 mg/dL (ref 8–23)
CO2: 32 mmol/L (ref 22–32)
Calcium: 9.7 mg/dL (ref 8.9–10.3)
Chloride: 93 mmol/L — ABNORMAL LOW (ref 98–111)
Creatinine, Ser: 0.6 mg/dL (ref 0.44–1.00)
GFR calc Af Amer: >60 mL/min (ref >60)
GFR calc non Af Amer: >60 mL/min (ref >60)
Glucose, Bld: 92 mg/dL (ref 70–99)
POTASSIUM: 3.7 mmol/L (ref 3.5–5.1)
Sodium: 134 mmol/L — ABNORMAL LOW (ref 135–145)

## 2018-06-12 LAB — CBC
HCT: 45.6 % (ref 36.0–46.0)
Hemoglobin: 15.1 g/dL — ABNORMAL HIGH (ref 12.0–15.0)
MCH: 31.2 pg (ref 26.0–34.0)
MCHC: 33.1 g/dL (ref 30.0–36.0)
MCV: 94.2 fL (ref 80.0–100.0)
Platelets: 291 10*3/uL (ref 150–400)
RBC: 4.84 MIL/uL (ref 3.87–5.11)
RDW: 12.9 % (ref 11.5–15.5)
WBC: 11.5 10*3/uL — AB (ref 4.0–10.5)
nRBC: 0 % (ref 0.0–0.2)

## 2018-06-12 NOTE — Care Management Note (Addendum)
Case Management Note  Patient Details  Name: DEVONNE KITCHEN MRN: [id number] Date of Birth: 1955/07/17  Subjective/Objective:   Pt presented for cardiac arrest. PTA from [place].                   Action/Plan: Pt in need of oxygen and DME was set up with [facility]. DME was delivered to the room. CM and CSW both reached out to the [facility] to see if any bed availability. Case Manager is [facility] and voicemail was left to see if patient could return to shelter. Awaiting call back. CM needs a physical address for the concentrator to be delivered. Patient states her car is at the [facility]. Once we figure out housing- patient has taxi voucher to get her to the shelter. Patient states she does not have family in [place] that can assist her with shelter. CM will continue to monitor for additional transition of care needs.   Expected Discharge Date:                  Expected Discharge Plan:  Homeless Shelter  In-House Referral:  Clinical Social Work  Discharge planning Services  CM Consult  Post Acute Care Choice:  Durable Medical Equipment Choice offered to:  Patient  DME Arranged:  Oxygen DME Agency:  [place]:  NA [place] Agency:  NA  Status of Service:  Completed, signed off  If discussed at [place] of Stay Meetings, dates discussed:    Additional Comments: 1424 06-12-18 Jacqlyn Krauss, RN,BSN Case Manager [phone] CM did receive call from [facility] and patient will get her previous bed back. Glenard Haring will work to assist patient for further housing. Per Glenard Haring, the group home that patient was previously in- patient has money tied up in this group home. This CM did ask Glenard Haring  If she could assist the patient to get to IDL facility at [facility] or another group home.This CM also spoke to Liaison from [facility] to see if CSW from [facility] can follow up with the patient and resources for housing as  well. Patient is aware that she will have her previous bed. No further needs from this CM.     1235 06-12-18 Jacqlyn Krauss, RN, BSN [phone] CM received call from [facility] at [facility]. Glenard Haring to discuss with Legrand Como @ [facility] to see if patient eligible to return.  Bethena Roys, RN 06/12/2018, 12:17 PM

## 2018-06-12 NOTE — Progress Notes (Signed)
Admit date: 06/04/2018 Discharge date: 06/12/2018  Admitted From: Homeless shelter Disposition:  Homeless shelter, patient refused SNF   Recommendations for Outpatient Follow-up:  1. Follow up with PCP in 1 week 2. Repeat Chest CT in 3-4 weeks after treatment for pneumonia to evaluate nodular opacities.   Equipment/Devices: Home O2  Discharge Condition: Stable CODE STATUS: Full Diet recommendation: Heart healthy   Brief/Interim Summary: RAI SINAGRA a29 year old female with past medical history significant for COPD, hyperlipidemia, hypertension, diabetes, DVT, schizophrenia who was admitted on 06/04/2018 status post cardiac arrest. She lives in a homeless shelter, went outside to smoke with her friends when they noted her to be short of breath, she collapsed to the ground. EMS was activated, she did receive CPR, 2 rounds of epinephrine with return of spontaneous circulation within 2 minutes. She was admitted to the ICU, was intubated, and had return of spontaneous circulation. She was found to have multifocal pneumonia.  Significant events: 1/14 admitted for cardiac arrest 1/14 echocardiogram EF 50 to 15%, grade 1 diastolic dysfunction 4/00 patient extubated 1/20 [facility] assumed care  Discharge Diagnoses:  Asystolic cardiac arrest Has been intubated and extubated,treated in the ICU. Appears to have been a primary respiratory event  H influenza multifocal pneumonia plus enterovirus/rhinovirus URI Completed 7-day treatments of Rocephin Blood cultures negative  Will need follow-up CT in 3 to 4 weeks once pneumonia resolves due to nodular opacities  Acute hypoxemic and hypercarbic respiratory failure Continue to wean nasal cannula O2 to room air. Ordered home O2 DME   Schizophrenia Patient has not been taking her risperidone prior to admission as it makes her drowsy  Mild hyponatremia Remains stable at this time  Type 2 diabetes mellitus, well  controlled Hemoglobin A1c 6.5  Chest pain, musculoskeletal Likely secondary to CPR  [facility]  Pt is stable Pt to return to using advair and spiriva and d/c smoking  Prn albuterol Continue oxygen 2L continuous.  Asencion Noble

## 2018-06-12 NOTE — Plan of Care (Signed)

## 2018-06-12 NOTE — Consult Note (Signed)
[facility] Primary Care Navigator  06/12/2018  Kim [name] 01/14/1956 [id number]     Met withpatientat the bedsideto identify possible discharge needs.  Patientwas brought to the hospital for cardiac arrest, admitted to the ICU and was intubated. She was found to have multifocal pneumonia.  She lives in a homeless shelter [facility]).  Maryln Manuel, NP with [facility] as herprimary care provider. Patientvoiced understanding to callprimary care provider's officeoncedischarged,for a post discharge follow-upvisit within 1- 2 weeks or sooner if needs arise.Patient letter (with PCP's contact number) was provided as herreminder.  According to patient, she is anticipating [facility] homeless [facility] (with oxygen that was delivered to her room).  Patient has a case manager Human resources officer) at [employer] who will assist with her needs and work to help patient for further housing.  Patient is with an external case management program with [employer].  No identifiable [facility] care management needs at this point.    For additional questions please contact:  Edwena Felty A. Canuto Kingston, BSN, RN-BC [facility] PRIMARY CARE Navigator Cell: [phone]

## 2018-06-12 NOTE — Discharge Summary (Signed)
Physician Discharge Summary  Kim [name] RSW:[id number] DOB: Feb 24, 1956 DOA: 06/04/2018  PCP: [name], FNP  Admit date: 06/04/2018 Discharge date: 06/12/2018  Admitted From: Homeless shelter Disposition:  Homeless shelter, patient refused SNF   Recommendations for Outpatient Follow-up:  1. Follow up with PCP in 1 week 2. Repeat Chest CT in 3-4 weeks after treatment for pneumonia to evaluate nodular opacities.   Equipment/Devices: Home O2  Discharge Condition: Stable CODE STATUS: Full Diet recommendation: Heart healthy   Brief/Interim Summary: Kim [name] is a 63 year old female with past medical history significant for COPD, hyperlipidemia, hypertension, diabetes, DVT, schizophrenia who was admitted on 06/04/2018 status post cardiac arrest.  She lives in a homeless shelter, went outside to smoke with her friends when they noted her to be short of breath, she collapsed to the ground.  EMS was activated, she did receive CPR, 2 rounds of epinephrine with return of spontaneous circulation within 2 minutes. She was admitted to the ICU, was intubated, and had return of spontaneous circulation.  She was found to have multifocal pneumonia.  Significant events: 1/14 admitted for cardiac arrest 1/14 echocardiogram EF 50 to 09%, grade 1 diastolic dysfunction 3/81 patient extubated 1/20 [facility] assumed care  Discharge Diagnoses:  Asystolic cardiac arrest Has been intubated and extubated, treated in the ICU.  Appears to have been a primary respiratory event  H influenza multifocal pneumonia plus enterovirus/rhinovirus URI Completed 7-day treatments of Rocephin Blood cultures negative  Will need follow-up CT in 3 to 4 weeks once pneumonia resolves due to nodular opacities  Acute hypoxemic and hypercarbic respiratory failure Continue to wean nasal cannula O2 to room air. Ordered home O2 DME   Schizophrenia Patient has not been taking her risperidone prior to admission as it makes  her drowsy  Mild hyponatremia Remains stable at this time  Type 2 diabetes mellitus, well controlled Hemoglobin A1c 6.5  Chest pain, musculoskeletal Likely secondary to CPR  Discharge Instructions  Discharge Instructions    Call MD for:  difficulty breathing, headache or visual disturbances   Complete by:  As directed    Call MD for:  extreme fatigue   Complete by:  As directed    Call MD for:  hives   Complete by:  As directed    Call MD for:  persistant dizziness or light-headedness   Complete by:  As directed    Call MD for:  persistant nausea and vomiting   Complete by:  As directed    Call MD for:  severe uncontrolled pain   Complete by:  As directed    Call MD for:  temperature >100.4   Complete by:  As directed    Diet - low sodium heart healthy   Complete by:  As directed    Discharge instructions   Complete by:  As directed    You were cared for by a hospitalist during your hospital stay. If you have any questions about your discharge medications or the care you received while you were in the hospital after you are discharged, you can call the unit and ask to speak with the hospitalist on call if the hospitalist that took care of you is not available. Once you are discharged, your primary care physician will handle any further medical issues. Please note that NO REFILLS for any discharge medications will be authorized once you are discharged, as it is imperative that you return to your primary care physician (or establish a relationship with a primary care physician  if you do not have one) for your aftercare needs so that they can reassess your need for medications and monitor your lab values.   Increase activity slowly   Complete by:  As directed      Allergies as of 06/12/2018      Reactions   Asa [aspirin] Other (See Comments)   Stomach pain   Metformin And Related Other (See Comments)   STOMACH PROBLEMS   Penicillins Nausea And Vomiting   Large doses    Quetiapine Other (See Comments)   REACTION: nausea/dizzy   Acetaminophen Nausea And Vomiting, Rash      Medication List    STOP taking these medications   risperiDONE 2 MG tablet Commonly known as:  RISPERDAL     TAKE these medications   albuterol 108 (90 Base) MCG/ACT inhaler Commonly known as:  VENTOLIN HFA Inhale 2 puffs into the lungs every 4 (four) hours as needed for wheezing or shortness of breath.   aspirin 81 MG chewable tablet Chew 1 tablet (81 mg total) by mouth daily.   cholecalciferol 1000 units tablet Commonly known as:  VITAMIN D Take 1,000 Units by mouth daily.   Fluticasone-Salmeterol 250-50 MCG/DOSE Aepb Commonly known as:  ADVAIR Inhale 1 puff into the lungs 2 (two) times daily.   furosemide 20 MG tablet Commonly known as:  LASIX TAKE 1 OR 2 TABLETS BY MOUTH AS NEEDED   tiotropium 18 MCG inhalation capsule Commonly known as:  SPIRIVA Place 1 capsule (18 mcg total) into inhaler and inhale daily.            Durable Medical Equipment  (From admission, onward)         Start     Ordered   06/12/18 1051  For home use only DME oxygen  Once    Question Answer Comment  Mode or (Route) Nasal cannula   Liters per Minute 3   Frequency Continuous (stationary and portable oxygen unit needed)   Oxygen delivery system Gas      06/12/18 1050         Follow-up Information    [name], FNP. Schedule an appointment as soon as possible for a visit in 1 week(s).   Specialty:  General Practice Contact information: [place] [address] [phone]          Allergies  Allergen Reactions  . Asa [Aspirin] Other (See Comments)    Stomach pain  . Metformin And Related Other (See Comments)    STOMACH PROBLEMS  . Penicillins Nausea And Vomiting    Large doses  . Quetiapine Other (See Comments)    REACTION: nausea/dizzy  . Acetaminophen Nausea And Vomiting and Rash     Procedures/Studies: Ct Head Wo  Contrast  Result Date: 06/04/2018 CLINICAL DATA:  Collapsed. EXAM: CT HEAD WITHOUT CONTRAST CT CERVICAL SPINE WITHOUT CONTRAST TECHNIQUE: Multidetector CT imaging of the head and cervical spine was performed following the standard protocol without intravenous contrast. Multiplanar CT image reconstructions of the cervical spine were also generated. COMPARISON:  Neck CT 06/05/2017 FINDINGS: CT HEAD FINDINGS Brain: No acute intracranial abnormality. Specifically, no hemorrhage, hydrocephalus, mass lesion, acute infarction, or significant intracranial injury. Vascular: No hyperdense vessel or unexpected calcification. Skull: No acute calvarial abnormality. Sinuses/Orbits: Mucosal thickening throughout the paranasal sinuses. OG tube and endotracheal tube partially imaged. Other: Large masses again noted in the left parotid gland are unchanged since prior study. CT CERVICAL SPINE FINDINGS Alignment: Normal alignment. Skull base and vertebrae:  No acute fracture. No primary bone lesion or focal pathologic process. Soft tissues and spinal canal: No prevertebral fluid or swelling. No visible canal hematoma. Disc levels:  Maintains Upper chest: No acute findings. Endotracheal tube and OG tube partially imaged. Other: None IMPRESSION: No acute intracranial abnormality. No acute bony abnormality in the cervical spine. Large masses within the left parotid gland are again noted and stable since neck CT 1 year ago. Electronically Signed   By: Rolm Baptise M.D.   On: 06/04/2018 10:20   Ct Angio Chest Pe W Or Wo Contrast  Result Date: 06/04/2018 CLINICAL DATA:  Shortness of breath with collapse.  CPR performed. EXAM: CT ANGIOGRAPHY CHEST WITH CONTRAST TECHNIQUE: Multidetector CT imaging of the chest was performed using the standard protocol during bolus administration of intravenous contrast. Multiplanar CT image reconstructions and MIPs were obtained to evaluate the vascular anatomy. CONTRAST:  59mL ISOVUE-370 IOPAMIDOL  (ISOVUE-370) INJECTION 76% COMPARISON:  Chest radiograph June 04, 2018 FINDINGS: Cardiovascular: There is no demonstrable pulmonary embolus. There is no thoracic aortic aneurysm. No dissection is evident in the aorta. The contrast bolus is less than optimal in the aorta for assessment for potential dissection. Visualized great vessels appear unremarkable. There is aortic atherosclerosis. There are foci of coronary artery calcification. There is a small pericardial effusion. Pericardium does not appear thickened. Heart is mildly enlarged. The main pulmonary outflow tract is significantly dilated, measuring 4.7 cm. Mediastinum/Nodes: Visualized thyroid appears unremarkable. There are scattered subcentimeter hilar and mediastinal lymph nodes. There is no frank adenopathy in the thoracic region. No esophageal lesions are evident. Nasogastric tube passes through the esophagus into the stomach. Lungs/Pleura: Patient is intubated with the endotracheal tube tip above the carina. No pneumothorax. There is airspace consolidation in both lower lobes, more pronounced on the right than on the left. There is also focal airspace consolidation in the medial segment right middle lobe. Small pleural effusions noted bilaterally. There is a degree of underlying interstitial edema. There is a focal area of opacity abutting the pleura in the posterior segment of the left upper lobe near the apex measuring 1.6 x 1.4 cm. An opacity abutting the pleura in the posterior segment of the right upper lobe near the apex measures 7 x 7 mm. There is a nodular opacity in the superior lingula measuring 7 x 7 mm, best seen on axial slice 65 series 6. A nodular opacity in the posterior segment of the right upper lobe seen on axial slice 58 series 6 measures 5 x 5 mm. Upper Abdomen: There is a cyst in the periphery of the right lobe of the liver measuring 1.9 x 1.9 cm. There is a probable cyst in the anterior aspect of the right lobe of liver  measuring 8 x 5 mm. There is mild reflux of contrast into the inferior vena cava and hepatic veins. Visualized upper abdominal structures otherwise appear unremarkable. Musculoskeletal: There are foci of degenerative change in the thoracic spine. There are no blastic or lytic bone lesions. There are no chest wall lesions evident. Review of the MIP images confirms the above findings. IMPRESSION: 1. No demonstrable pulmonary embolus. No thoracic aortic aneurysm or dissection. There is aortic atherosclerosis. There are foci of coronary artery calcification. 2. Marked prominence of the main pulmonary outflow tract, a finding indicative of pulmonary arterial hypertension. 3. Multiple foci of pneumonia. Small pleural effusions bilaterally. There are several nodular opacities bilaterally, largest in the left upper lobe posteriorly near the apex measuring 1.6 x 1.4  cm. It should be noted that all of these areas of opacity could be due to multifocal pneumonia. Underlying neoplasm, particularly in the left upper lobe, is a consideration. In this regard, would advise repeat CT after treatment for pneumonia in approximately 3-4 weeks. If nodular opacities persist at that time, correlation with nuclear medicine PET study may well be advisable. 4.  No adenopathy by size criteria. 5.  Small pericardial effusion noted. 6. Mild reflux of contrast into the inferior vena cava and hepatic veins, a finding that may indicate a degree of increase in right heart pressure. Aortic Atherosclerosis (ICD10-I70.0). Electronically Signed   By: Lowella Grip III M.D.   On: 06/04/2018 10:28   Ct Cervical Spine Wo Contrast  Result Date: 06/04/2018 CLINICAL DATA:  Collapsed. EXAM: CT HEAD WITHOUT CONTRAST CT CERVICAL SPINE WITHOUT CONTRAST TECHNIQUE: Multidetector CT imaging of the head and cervical spine was performed following the standard protocol without intravenous contrast. Multiplanar CT image reconstructions of the cervical spine were  also generated. COMPARISON:  Neck CT 06/05/2017 FINDINGS: CT HEAD FINDINGS Brain: No acute intracranial abnormality. Specifically, no hemorrhage, hydrocephalus, mass lesion, acute infarction, or significant intracranial injury. Vascular: No hyperdense vessel or unexpected calcification. Skull: No acute calvarial abnormality. Sinuses/Orbits: Mucosal thickening throughout the paranasal sinuses. OG tube and endotracheal tube partially imaged. Other: Large masses again noted in the left parotid gland are unchanged since prior study. CT CERVICAL SPINE FINDINGS Alignment: Normal alignment. Skull base and vertebrae: No acute fracture. No primary bone lesion or focal pathologic process. Soft tissues and spinal canal: No prevertebral fluid or swelling. No visible canal hematoma. Disc levels:  Maintains Upper chest: No acute findings. Endotracheal tube and OG tube partially imaged. Other: None IMPRESSION: No acute intracranial abnormality. No acute bony abnormality in the cervical spine. Large masses within the left parotid gland are again noted and stable since neck CT 1 year ago. Electronically Signed   By: Rolm Baptise M.D.   On: 06/04/2018 10:20   Dg Chest Port 1 View  Result Date: 06/08/2018 CLINICAL DATA:  Endotracheal tube EXAM: PORTABLE CHEST 1 VIEW COMPARISON:  06/07/2018 FINDINGS: Endotracheal tube removed.  NG removed.  Central line SVC unchanged. Improvement in bibasilar atelectasis. Small pleural effusions. No pneumothorax. IMPRESSION: Endotracheal tube and NG tube removed. Improvement in bibasilar atelectasis.  No pneumothorax. Electronically Signed   By: Franchot Gallo M.D.   On: 06/08/2018 07:35   Dg Chest Port 1 View  Result Date: 06/07/2018 CLINICAL DATA:  Check endotracheal tube placement EXAM: PORTABLE CHEST 1 VIEW COMPARISON:  06/06/2018 FINDINGS: Endotracheal tube and gastric catheter are again noted and stable. Left jugular central line is noted in the distal superior vena cava. Cardiac shadow  remains enlarged. Lungs are well aerated bilaterally with mild bibasilar atelectasis stable from the prior exam. No new focal abnormality is noted. IMPRESSION: No significant interval change from the previous day. Electronically Signed   By: Inez Catalina M.D.   On: 06/07/2018 07:46   Dg Chest Port 1 View  Result Date: 06/06/2018 CLINICAL DATA:  Endotracheal placement. Cardiac arrest. EXAM: PORTABLE CHEST 1 VIEW COMPARISON:  06/05/2018 FINDINGS: Endotracheal tube tip is 3.4 cm above the carina. New is a gastric tube enters the stomach. Left internal jugular central line tip is in the SVC above the right atrium. Upper lobes remain clear. Mild bibasilar atelectasis, similar to yesterday's study. IMPRESSION: Endotracheal tube and gastric tube well positioned. Persistent mild basilar atelectasis. Electronically Signed   By: Jan Fireman.D.  On: 06/06/2018 08:06   Dg Chest Port 1 View  Result Date: 06/05/2018 CLINICAL DATA:  Endotracheal tube present.  Cardiac arrest. EXAM: PORTABLE CHEST 1 VIEW COMPARISON:  CT of the chest 06/04/2018. FINDINGS: Cardiac enlargement is again noted. Endotracheal tube is stable. Left IJ line is stable. NG tube courses off the inferior border the film. Defibrillator pads remain in place. Small effusions and mild pulmonary vascular congestion is stable. Bibasilar airspace disease likely reflects atelectasis. IMPRESSION: 1. Stable cardiomegaly and mild pulmonary vascular congestion. 2. Small effusions and bibasilar airspace disease, likely atelectasis. 3. Support apparatus is stable. Electronically Signed   By: San Morelle M.D.   On: 06/05/2018 07:23   Dg Chest Portable 1 View  Result Date: 06/04/2018 CLINICAL DATA:  Central line placement, diabetes, smoking history EXAM: PORTABLE CHEST 1 VIEW COMPARISON:  Chest x-ray of 06/04/2018 and 04/03/2017 FINDINGS: The tip of the endotracheal tube is approximately 4.2 cm above the carina. Left IJ central venous line is now  present with the tip overlying the mid SVC. No pneumothorax is seen. NG tube extends below the hemidiaphragm. Prominent markings remain throughout the lungs and an element of interstitial edema is a definite consideration. Cardiomegaly is stable. IMPRESSION: 1. Left central venous line tip overlies the mid SVC. No pneumothorax. 2. Endotracheal tube tip 4.2 cm above the carina. 3. Cardiomegaly and probable mild interstitial edema. Electronically Signed   By: Ivar Drape M.D.   On: 06/04/2018 09:07   Dg Chest Port 1 View  Result Date: 06/04/2018 CLINICAL DATA:  Cardiac arrest. EXAM: PORTABLE CHEST 1 VIEW COMPARISON:  Radiographs of April 03, 2018. FINDINGS: Stable cardiomegaly. Endotracheal and nasogastric tubes appear to be in grossly good position. No pneumothorax or significant pleural effusion is noted. Mild interstitial and reticular densities are noted throughout both lungs which may represent pulmonary edema. Bony thorax is unremarkable. IMPRESSION: Endotracheal and nasogastric tubes appear to be in good position. Mild interstitial and reticular densities are noted throughout both lungs which may represent pulmonary edema. Electronically Signed   By: Marijo Conception, M.D.   On: 06/04/2018 07:14     Discharge Exam: Vitals:   06/12/18 0453 06/12/18 0858  BP: 113/83 (!) 102/56  Pulse: 70 86  Resp:  (!) 26  Temp: 97.8 F (36.6 C)   SpO2: 90% 93%    General: Pt is alert, awake, not in acute distress Cardiovascular: RRR, S1/S2 +, no rubs, no gallops Respiratory: CTA bilaterally, no wheezing, no rhonchi, on [place] O2  Abdominal: Soft, NT, ND, bowel sounds + Extremities: no edema, no cyanosis    The results of significant diagnostics from this hospitalization (including imaging, microbiology, ancillary and laboratory) are listed below for reference.     Microbiology: Recent Results (from the past 240 hour(s))  Culture, blood (routine x 2)     Status: None   Collection Time: 06/04/18  9:27  AM  Result Value Ref Range Status   Specimen Description BLOOD LEFT HAND  Final   Special Requests   Final    BOTTLES DRAWN AEROBIC ONLY Blood Culture results may not be optimal due to an inadequate volume of blood received in culture bottles   Culture   Final    NO GROWTH 5 DAYS Performed at [facility], [place] [address] [place], [place] [address]    Report Status 06/09/2018 FINAL  Final  Culture, blood (routine x 2)     Status: None   Collection Time: 06/04/18 11:00 AM  Result  Value Ref Range Status   Specimen Description BLOOD LEFT HAND  Final   Special Requests   Final    BOTTLES DRAWN AEROBIC AND ANAEROBIC Blood Culture adequate volume   Culture   Final    NO GROWTH 5 DAYS Performed at [facility], [address], [place], [place] [address]    Report Status 06/09/2018 FINAL  Final  Urine culture     Status: None   Collection Time: 06/04/18 11:34 AM  Result Value Ref Range Status   Specimen Description URINE, RANDOM  Final   Special Requests NONE  Final   Culture   Final    NO GROWTH Performed at [facility], [place] [address], [place], [place] [address]    Report Status 06/05/2018 FINAL  Final  Culture, respiratory (tracheal aspirate)     Status: None   Collection Time: 06/04/18 11:34 AM  Result Value Ref Range Status   Specimen Description TRACHEAL ASPIRATE  Final   Special Requests NONE  Final   Gram Stain   Final    ABUNDANT WBC PRESENT, PREDOMINANTLY PMN MODERATE GRAM NEGATIVE RODS    Culture   Final    MODERATE HAEMOPHILUS INFLUENZAE BETA LACTAMASE NEGATIVE Performed at [facility], [place] [address], [place], [place] [address]    Report Status 06/06/2018 FINAL  Final  MRSA PCR Screening     Status: None   Collection Time: 06/04/18 11:34 AM  Result Value Ref Range Status   MRSA by PCR NEGATIVE NEGATIVE Final    Comment:        The GeneXpert MRSA Assay (FDA approved for NASAL specimens only), is one component of a comprehensive MRSA  colonization surveillance program. It is not intended to diagnose MRSA infection nor to guide or monitor treatment for MRSA infections. Performed at [facility], [place] [address] [place], [place] [address]   Respiratory Panel by PCR     Status: Abnormal   Collection Time: 06/04/18  2:24 PM  Result Value Ref Range Status   Adenovirus NOT DETECTED NOT DETECTED Final   Coronavirus 229E NOT DETECTED NOT DETECTED Final   Coronavirus HKU1 NOT DETECTED NOT DETECTED Final   Coronavirus NL63 NOT DETECTED NOT DETECTED Final   Coronavirus OC43 NOT DETECTED NOT DETECTED Final   Metapneumovirus NOT DETECTED NOT DETECTED Final   Rhinovirus / Enterovirus DETECTED (A) NOT DETECTED Final   Influenza A NOT DETECTED NOT DETECTED Final   Influenza B NOT DETECTED NOT DETECTED Final   Parainfluenza Virus 1 NOT DETECTED NOT DETECTED Final   Parainfluenza Virus 2 NOT DETECTED NOT DETECTED Final   Parainfluenza Virus 3 NOT DETECTED NOT DETECTED Final   Parainfluenza Virus 4 NOT DETECTED NOT DETECTED Final   Respiratory Syncytial Virus NOT DETECTED NOT DETECTED Final   Bordetella pertussis NOT DETECTED NOT DETECTED Final   Chlamydophila pneumoniae NOT DETECTED NOT DETECTED Final   Mycoplasma pneumoniae NOT DETECTED NOT DETECTED Final    Comment: Performed at [facility], [place]. [address], [place], [place] [address]     Labs: BNP (last 3 results) No results for input(s): BNP in the last 8760 hours. Basic Metabolic Panel: Recent Labs  Lab 06/06/18 1700 06/07/18 0300 06/07/18 1617 06/08/18 0212 06/09/18 0427 06/10/18 0510 06/11/18 0418 06/12/18 0443  NA  --  131*  --  133* 132* 132* 133* 134*  K  --  4.1  --  3.2* 3.5 3.5 3.7 3.7  CL  --  92*  --  81* 88* 91* 92* 93*  CO2  --  35*  --  41* 35* 32 33* 32  GLUCOSE  --  126*  --  105* 95 95 108* 92  BUN  --  14  --  15 15 13 11 10   CREATININE  --  0.68  --  0.72 0.63 0.49 0.60 0.60  CALCIUM  --  9.3  --  9.7 9.7 9.7 9.7 9.7  MG 1.8  1.8 1.5* 1.5* 1.8 1.5* 1.8  --   PHOS 3.1 2.6 4.5 4.4  --  2.8  --   --    Liver Function Tests: Recent Labs  Lab 06/08/18 0212  AST 13*  ALT 20  ALKPHOS 79  BILITOT 0.7  PROT 6.1*  ALBUMIN 2.5*   No results for input(s): LIPASE, AMYLASE in the last 168 hours. No results for input(s): AMMONIA in the last 168 hours. CBC: Recent Labs  Lab 06/08/18 0212 06/09/18 0427 06/10/18 0510 06/11/18 0418 06/12/18 0443  WBC 17.2* 11.6* 12.0* 12.7* 11.5*  NEUTROABS  --  9.0*  --   --   --   HGB 15.0 14.7 15.3* 15.0 15.1*  HCT 47.4* 45.9 45.5 44.9 45.6  MCV 96.7 95.8 95.0 93.3 94.2  PLT 197 190 242 255 291   Cardiac Enzymes: No results for input(s): CKTOTAL, CKMB, CKMBINDEX, TROPONINI in the last 168 hours. BNP: Invalid input(s): POCBNP CBG: Recent Labs  Lab 06/09/18 1605 06/09/18 2019 06/09/18 2358 06/10/18 0400 06/10/18 0814  GLUCAP 128* 87 89 87 107*   D-Dimer No results for input(s): DDIMER in the last 72 hours. Hgb A1c Recent Labs    06/11/18 0418  HGBA1C 6.5*   Lipid Profile No results for input(s): CHOL, HDL, LDLCALC, TRIG, CHOLHDL, LDLDIRECT in the last 72 hours. Thyroid function studies No results for input(s): TSH, T4TOTAL, T3FREE, THYROIDAB in the last 72 hours.  Invalid input(s): FREET3 Anemia work up No results for input(s): VITAMINB12, FOLATE, FERRITIN, TIBC, IRON, RETICCTPCT in the last 72 hours. Urinalysis No results found for: COLORURINE, APPEARANCEUR, [place], [place], [place], [place], [place], [place], PROTEINUR, UROBILINOGEN, NITRITE, LEUKOCYTESUR Sepsis Labs Invalid input(s): PROCALCITONIN,  WBC,  LACTICIDVEN Microbiology Recent Results (from the past 240 hour(s))  Culture, blood (routine x 2)     Status: None   Collection Time: 06/04/18  9:27 AM  Result Value Ref Range Status   Specimen Description BLOOD LEFT HAND  Final   Special Requests   Final    BOTTLES DRAWN AEROBIC ONLY Blood Culture results may not be optimal due to an  inadequate volume of blood received in culture bottles   Culture   Final    NO GROWTH 5 DAYS Performed at [facility], [place] [address], [place], [place] [address]    Report Status 06/09/2018 FINAL  Final  Culture, blood (routine x 2)     Status: None   Collection Time: 06/04/18 11:00 AM  Result Value Ref Range Status   Specimen Description BLOOD LEFT HAND  Final   Special Requests   Final    BOTTLES DRAWN AEROBIC AND ANAEROBIC Blood Culture adequate volume   Culture   Final    NO GROWTH 5 DAYS Performed at [facility], [place] [address] [place], [place] [address]    Report Status 06/09/2018 FINAL  Final  Urine culture     Status: None   Collection Time: 06/04/18 11:34 AM  Result Value Ref Range Status   Specimen  Description URINE, RANDOM  Final   Special Requests NONE  Final   Culture   Final    NO GROWTH Performed at [facility], [place] [address] [address], [place], [place] [address]    Report Status 06/05/2018 FINAL  Final  Culture, respiratory (tracheal aspirate)     Status: None   Collection Time: 06/04/18 11:34 AM  Result Value Ref Range Status   Specimen Description TRACHEAL ASPIRATE  Final   Special Requests NONE  Final   Gram Stain   Final    ABUNDANT WBC PRESENT, PREDOMINANTLY PMN MODERATE GRAM NEGATIVE RODS    Culture   Final    MODERATE HAEMOPHILUS INFLUENZAE BETA LACTAMASE NEGATIVE Performed at [facility], [place] [address], [place], [place] [address]    Report Status 06/06/2018 FINAL  Final  MRSA PCR Screening     Status: None   Collection Time: 06/04/18 11:34 AM  Result Value Ref Range Status   MRSA by PCR NEGATIVE NEGATIVE Final    Comment:        The GeneXpert MRSA Assay (FDA approved for NASAL specimens only), is one component of a comprehensive MRSA colonization surveillance program. It is not intended to diagnose MRSA infection nor to guide or monitor treatment for MRSA infections. Performed at [facility], [place] [address], [place], [place] [address]   Respiratory Panel by PCR     Status: Abnormal   Collection Time: 06/04/18  2:24 PM  Result Value Ref Range Status   Adenovirus NOT DETECTED NOT DETECTED Final   Coronavirus 229E NOT DETECTED NOT DETECTED Final   Coronavirus HKU1 NOT DETECTED NOT DETECTED Final   Coronavirus NL63 NOT DETECTED NOT DETECTED Final   Coronavirus OC43 NOT DETECTED NOT DETECTED Final   Metapneumovirus NOT DETECTED NOT DETECTED Final   Rhinovirus / Enterovirus DETECTED (A) NOT DETECTED Final   Influenza A NOT DETECTED NOT DETECTED Final   Influenza B NOT DETECTED NOT DETECTED Final   Parainfluenza Virus 1 NOT DETECTED NOT DETECTED Final   Parainfluenza Virus 2 NOT DETECTED NOT DETECTED Final   Parainfluenza Virus 3 NOT DETECTED NOT DETECTED Final   Parainfluenza Virus 4 NOT DETECTED NOT DETECTED Final   Respiratory Syncytial Virus NOT DETECTED NOT DETECTED Final   Bordetella pertussis NOT DETECTED NOT DETECTED Final   Chlamydophila pneumoniae NOT DETECTED NOT DETECTED Final   Mycoplasma pneumoniae NOT DETECTED NOT DETECTED Final    Comment: Performed at [facility], [place]. [address], [place], [place] [address]     Patient was seen and examined on the day of discharge and was found to be in stable condition. Time coordinating discharge: 35 minutes including assessment and coordination of care, as well as examination of the patient.   SIGNED:  Dessa Phi, DO [facility] [email or web address] 06/12/2018, 2:27 PM

## 2018-06-12 NOTE — Clinical Social Work Note (Signed)
RNCM has arranged for patient to return to shelter today.  CSW signing off.   Deziree Mokry, [place]

## 2018-06-12 NOTE — Social Work (Addendum)
This is a late entry note. CSW met with patient at bedside on the afternoon of 06/11/18 and discussed disposition planning. Patient refused SNF, stating that she wants to go back to the shelter and also needs to go back to work. Patient was eager to leave the hospital on the afternoon of 1/21.   CSW and patient called [facility] shelter today to confirm if patient would still have a bed. Shelter staff indicated she would still have a bed, but needs to call if she remains admitted tonight. A represetative from the shelter also arrived with concerns that patient may not be medically appropriate to discharge back to the shelter. Patient gave permission to speak to the shelter representative. Explained to patient and shelter staff that patient walking well and despite reason for admission, insurance unlikely to approve for SNF. Patient also refusing SNF.  Provided list of other shelters for reference if patient loses current shelter bed. Provided information on [facility] for emergency winter shelter and case management.   Updated MD and [facility], as patient may need new O2.   Provided clothing from donations in [place]. CSW can support with transportation back to the shelter.  Estanislado Emms, LCSW [phone]

## 2018-06-13 ENCOUNTER — Telehealth: Payer: Self-pay | Admitting: Nurse Practitioner

## 2018-06-13 ENCOUNTER — Other Ambulatory Visit: Payer: Self-pay

## 2018-06-13 DIAGNOSIS — F068 Other specified mental disorders due to known physiological condition: Secondary | ICD-10-CM | POA: Diagnosis not present

## 2018-06-13 NOTE — Progress Notes (Signed)
This is a 63 year old female who was just discharged earlier today from [facility] after she had a respiratory arrest on June 04, 2018.  We are seeing her this evening at the [place] clinic as she just arrived from the hospital.  Patient upon review of the chart had Haemophilus influenza pneumonia bilaterally and enterovirus in the respiratory area.  Patient has underlying chronic obstructive lung disease.  She just arrived to the [place] shelter end of December 2019.  She actually has her own vehicle and as well has [employer] advantage insurance.  She has an active primary care physician at [facility].  She also carries a diagnosis of schizophrenia and has been on respite all in the past but no longer takes this as she feels it causes drowsiness.  The patient is requesting a behavioral health referral.  She is now on Advair 251 puff twice daily and Spiriva daily.  She was discharged this afternoon on oxygen 2 L continuous and has a portable concentrator for this purpose.  She also has an albuterol rescue inhaler.  No steroids or antibiotics were sent with the patient on discharge.  She was given a full 7-day course of Rocephin in the hospital for her pneumonia.  Last chest x-ray we see on the patient shows a right lower lobe infiltrate.   On exam blood pressure 104/62 saturation 95% on 2 L pulse 74 and temperature is 97  Chest showed diminished breath sounds without rales wheeze or rhonchi  Cardiac exam showed a regular rate and rhythm without S3 normal S1-S2  Abdomen was soft nontender there was no hepatosplenomegaly  Extremities showed no edema  Neurologic the patient was awake and alert and nonagitated  Skin exam was clear  HEENT throat was clear neck supple there was no jugular venous tension and no lymphadenopathy   Impression here is that this patient had a respiratory arrest from significant hypoxemia with underlying COPD exacerbation mucous  plugging and Haemophilus influenza pneumonia and superimposed enterovirus  The patient has ongoing tobacco use 1 pack a day  The patient also has now chronic hypoxic respiratory failure requiring oxygen therapy  The plan will be for the patient to follow back up with her primary care physician as soon as possible  She is to stay on the Advair and Spiriva daily and oxygen 2 L continuous  She needs to continue to work on smoking cessation  I have asked the [facility] nurse to check on her again Friday, January 24 when she is in at [employer] will be working with this patient to connect her with a behavioral health provider   I told the patient I am available to see her at [employer] and wellness for pulmonary follow-up as well

## 2018-06-13 NOTE — Telephone Encounter (Signed)
Transition Care Management Follow-up Telephone Call  Date of discharge and from where: 06/13/2018 from [facility]   How have you been since you were released from the hospital? Been already, but sore on top part of chest were "something was placed"  Any questions or concerns? No   Items Reviewed:  Did the pt receive and understand the discharge instructions provided? Yes   Medications obtained and verified? Yes   Any new allergies since your discharge? No   Dietary orders reviewed? Yes  Do you have support at home? Yes   Other (ie: DME, Home Health, etc) going to have an oxygen machine  Functional Questionnaire: (I = Independent and D = Dependent) ADL's: I  Bathing/Dressing- I   Meal Prep- I  Eating- I  Maintaining continence- I  Transferring/Ambulation- I  Managing Meds- I   Follow up appointments reviewed:    PCP Hospital f/u appt confirmed? Yes  Scheduled to see J. Laurance Flatten DNP on 06/14/2018 @ 11:45.  [facility] f/u appt confirmed? Yes  Scheduled to see  on  @ .  Are transportation arrangements needed? No   If their condition worsens, is the pt aware to call  their PCP or go to the ED? Yes  Was the patient provided with contact information for the PCP's office or ED? Yes  Was the pt encouraged to call back with questions or concerns? Yes

## 2018-06-13 NOTE — Patient Outreach (Signed)
[facility]) Care Management  06/13/2018  Kim [name] Oct 16, 1955 [id number]   Referral Date: 06/13/2018 Referral Source: [facility] Referral Reason: Patient staying in homeless shelter.   Outreach Attempt: spoke with patient.  She is able to verify HIPAA.  She shares that she stays at [facility] in [place] a homeless shelter.  Discussed reason for referral from [facility].  Patient states that she has someone at the homeless shelter working with her on housing and denies needing any assistance right now.    Discussed with patient [facility] services and support.  Patient states that she sees her primary doctor tomorrow and denies any need of additional support right now.  She states she is on the mend and uses her oxygen as needed.    Patient with past medical history significant for COPD, hyperlipidemia, hypertension, diabetes, DVT, schizophrenia who was admitted on 06/04/2018 status post cardiac arrest.   Patient refused SNF admit and wanted to go back to [facility].  Therefore patient was discharged back to [facility] were she has case management support and a bed.    Plan: RN CM will send letter and close case.    Jone Baseman, RN, MSN [facility] Care Management Care Management Coordinator Direct Line [phone] Toll Free: [phone]  Fax: [phone]

## 2018-06-13 NOTE — Telephone Encounter (Signed)
ATT TO CONTACT PT TO SCHEDULE HFU.NO ANS LVM TO CONTACT OFC TO [facility] APPT

## 2018-06-14 ENCOUNTER — Inpatient Hospital Stay: Payer: Self-pay | Admitting: Nurse Practitioner

## 2018-06-14 DIAGNOSIS — F068 Other specified mental disorders due to known physiological condition: Secondary | ICD-10-CM | POA: Diagnosis not present

## 2018-06-19 ENCOUNTER — Encounter: Payer: Self-pay | Admitting: Critical Care Medicine

## 2018-06-19 ENCOUNTER — Other Ambulatory Visit: Payer: Self-pay | Admitting: Critical Care Medicine

## 2018-06-19 DIAGNOSIS — J449 Chronic obstructive pulmonary disease, unspecified: Secondary | ICD-10-CM

## 2018-06-19 MED ORDER — FLUTICASONE-SALMETEROL 250-50 MCG/DOSE IN AEPB: 1.0000 | INHALATION_SPRAY | Freq: Two times a day (BID) | RESPIRATORY_TRACT | 5 refills | Status: DC

## 2018-06-19 MED ORDER — NAPROXEN 375 MG PO TABS: 375.0000 mg | ORAL_TABLET | Freq: Two times a day (BID) | ORAL | 0 refills | Status: DC | PRN

## 2018-06-19 NOTE — Progress Notes (Signed)
See documentation.

## 2018-06-19 NOTE — Progress Notes (Addendum)
This is a 63 year old female who was just discharged earlier today from [facility] after she had a respiratory arrest on June 04, 2018.  We are seeing her this evening at the [place] clinic 1 week follow-up after her hospitalization. Note upon review of the chart had Haemophilus influenza pneumonia bilaterally and enterovirus in the respiratory area.  Patient has underlying chronic obstructive lung disease.  She just arrived to the [place] shelter end of December 2019.  She actually has her own vehicle and as well has [employer] advantage insurance.  She has an active primary care physician at [facility].  She also carries a diagnosis of schizophrenia and has been on respite all in the past but no longer takes this as she feels it causes drowsiness.  The patient is requesting a behavioral health referral.  She is now on Advair 251 puff twice daily and Spiriva daily.  She was discharged this afternoon on oxygen 2 L continuous and has a portable concentrator for this purpose.  She also has an albuterol rescue inhaler.  No steroids or antibiotics were sent with the patient on discharge.  She was given a full 7-day course of Rocephin in the hospital for her pneumonia.  Last chest x-ray we see on the patient shows a right lower lobe infiltrate.  At the last visit we did not make major changes in the patient's program she had completed a course of antibiotics.  However today she states she is having some chest wall pain from the CPR.  She also complains of a left parotid gland swelling.  She states she is not using her Advair or Spiriva at this time.  She is still actively smoking half a pack a day of cigarettes.  She is using her oxygen only at night at this time.   On exam blood pressure blood pressure is 142/92 saturation 93% room air pulse 77  On the neck exam there is an enlarged left parotid gland that is nontender Chest showed diminished breath sounds without rales  wheeze or rhonchi  Cardiac exam showed a regular rate and rhythm without S3 normal S1-S2  Abdomen was soft nontender there was no hepatosplenomegaly  Extremities showed no edema  Neurologic the patient was awake and alert and nonagitated  Skin exam was clear  HEENT throat was clear neck supple there was no jugular venous tension and no lymphadenopathy   Impression here is that this patient had a respiratory arrest from significant hypoxemia with underlying COPD exacerbation mucous plugging and Haemophilus influenza pneumonia and superimposed enterovirus  On review the patient's chart we find that she was at [facility] in March 2019 and 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  Note she is not using Advair or Spiriva.  We recommended she resume the Advair and we sent a refill on the patient of the Advair as she is out of this medication.  We also sent Naprosyn 375 mg twice daily as needed for chest wall pain.  She is instructed to stay on her oxygen as prescribed and follow-up with her primary care physician as soon as possible.  I am also willing to see her from a pulmonary standpoint in our office setting or  see her again in the following week at the [place]

## 2018-06-21 ENCOUNTER — Inpatient Hospital Stay: Payer: Medicare HMO | Admitting: Nurse Practitioner

## 2018-06-22 DIAGNOSIS — F068 Other specified mental disorders due to known physiological condition: Secondary | ICD-10-CM | POA: Diagnosis not present

## 2018-06-23 DIAGNOSIS — F068 Other specified mental disorders due to known physiological condition: Secondary | ICD-10-CM | POA: Diagnosis not present

## 2018-06-24 DIAGNOSIS — F068 Other specified mental disorders due to known physiological condition: Secondary | ICD-10-CM | POA: Diagnosis not present

## 2018-06-25 DIAGNOSIS — F068 Other specified mental disorders due to known physiological condition: Secondary | ICD-10-CM | POA: Diagnosis not present

## 2018-06-26 DIAGNOSIS — F068 Other specified mental disorders due to known physiological condition: Secondary | ICD-10-CM | POA: Diagnosis not present

## 2018-06-27 DIAGNOSIS — F068 Other specified mental disorders due to known physiological condition: Secondary | ICD-10-CM | POA: Diagnosis not present

## 2018-06-28 DIAGNOSIS — F068 Other specified mental disorders due to known physiological condition: Secondary | ICD-10-CM | POA: Diagnosis not present

## 2018-07-04 ENCOUNTER — Telehealth: Payer: Self-pay | Admitting: Adult Health

## 2018-07-04 ENCOUNTER — Telehealth: Payer: Self-pay | Admitting: Nurse Practitioner

## 2018-07-04 NOTE — Telephone Encounter (Signed)
lmtcb for pt. Pt has not been seen since June 2019.

## 2018-07-04 NOTE — Telephone Encounter (Signed)
Called to schedule Medicare Annual Wellness Visit with the Nurse Health Advisor. No answer and no machine to leave a message  If patient returns call, please note: their last AWV was on 05/01/17. please schedule AWV-s with NHA any date AFTER 05/01/2018.  Thank you! For any questions please contact: Janace Hoard at [phone] or Skype [email or web address]

## 2018-07-05 NOTE — Telephone Encounter (Signed)
Called patient x2. Phone rang a few times then goes to busy signal. Will call back.

## 2018-07-07 DIAGNOSIS — J449 Chronic obstructive pulmonary disease, unspecified: Secondary | ICD-10-CM | POA: Diagnosis not present

## 2018-07-07 DIAGNOSIS — R32 Unspecified urinary incontinence: Secondary | ICD-10-CM | POA: Diagnosis not present

## 2018-07-07 DIAGNOSIS — G4733 Obstructive sleep apnea (adult) (pediatric): Secondary | ICD-10-CM | POA: Diagnosis not present

## 2018-07-07 DIAGNOSIS — J439 Emphysema, unspecified: Secondary | ICD-10-CM | POA: Diagnosis not present

## 2018-07-07 DIAGNOSIS — I509 Heart failure, unspecified: Secondary | ICD-10-CM | POA: Diagnosis not present

## 2018-07-08 DIAGNOSIS — F209 Schizophrenia, unspecified: Secondary | ICD-10-CM | POA: Diagnosis not present

## 2018-07-08 NOTE — Telephone Encounter (Signed)
ATC patient [place] work note.  Called her home/preferred number, [phone], no answer, no VM.  Called [phone], this is her work number, and was told that she was unable to come to phone at this time, because she had just left the office. Will close message due to Patient has returned to work,and her last OV was 10/23/17, with Tammy P, NP.

## 2018-07-10 ENCOUNTER — Ambulatory Visit (INDEPENDENT_AMBULATORY_CARE_PROVIDER_SITE_OTHER): Payer: Medicare HMO | Admitting: Nurse Practitioner

## 2018-07-10 ENCOUNTER — Encounter: Payer: Self-pay | Admitting: Nurse Practitioner

## 2018-07-10 VITALS — BP 160/110 | HR 82 | Temp 98.3°F | Ht 62.8 in | Wt 157.2 lb

## 2018-07-10 DIAGNOSIS — J449 Chronic obstructive pulmonary disease, unspecified: Secondary | ICD-10-CM | POA: Diagnosis not present

## 2018-07-10 DIAGNOSIS — Z8674 Personal history of sudden cardiac arrest: Secondary | ICD-10-CM | POA: Insufficient documentation

## 2018-07-10 DIAGNOSIS — R030 Elevated blood-pressure reading, without diagnosis of hypertension: Secondary | ICD-10-CM

## 2018-07-10 DIAGNOSIS — R059 Cough, unspecified: Secondary | ICD-10-CM

## 2018-07-10 DIAGNOSIS — R05 Cough: Secondary | ICD-10-CM

## 2018-07-10 DIAGNOSIS — I252 Old myocardial infarction: Secondary | ICD-10-CM

## 2018-07-10 MED ORDER — BENZONATATE 100 MG PO CAPS: 100.0000 mg | ORAL_CAPSULE | Freq: Four times a day (QID) | ORAL | 1 refills | Status: DC | PRN
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
## 2018-07-10 NOTE — Patient Instructions (Signed)

## 2018-07-11 ENCOUNTER — Ambulatory Visit: Payer: Self-pay

## 2018-07-11 DIAGNOSIS — R030 Elevated blood-pressure reading, without diagnosis of hypertension: Secondary | ICD-10-CM

## 2018-07-11 DIAGNOSIS — I2510 Atherosclerotic heart disease of native coronary artery without angina pectoris: Secondary | ICD-10-CM

## 2018-07-11 DIAGNOSIS — J439 Emphysema, unspecified: Secondary | ICD-10-CM

## 2018-07-11 DIAGNOSIS — I469 Cardiac arrest, cause unspecified: Secondary | ICD-10-CM

## 2018-07-11 LAB — BMP8+EGFR
BUN/Creatinine Ratio: 12 (ref 12–28)
BUN: 7 mg/dL — ABNORMAL LOW (ref 8–27)
CALCIUM: 9.9 mg/dL (ref 8.7–10.3)
CO2: 26 mmol/L (ref 20–29)
Chloride: 101 mmol/L (ref 96–106)
Creatinine, Ser: 0.58 mg/dL (ref 0.57–1.00)
GFR calc Af Amer: 113 mL/min/{1.73_m2} (ref >59)
GFR calc non Af Amer: 98 mL/min/{1.73_m2} (ref >59)
Glucose: 78 mg/dL (ref 65–99)
POTASSIUM: 4.3 mmol/L (ref 3.5–5.2)
Sodium: 139 mmol/L (ref 134–144)

## 2018-07-11 LAB — CBC
Hematocrit: 43.5 % (ref 34.0–46.6)
Hemoglobin: 14.8 g/dL (ref 11.1–15.9)
MCH: 31.8 pg (ref 26.6–33.0)
MCHC: 34 g/dL (ref 31.5–35.7)
MCV: 93 fL (ref 79–97)
Platelets: 235 10*3/uL (ref 150–450)
RBC: 4.66 x10E6/uL (ref 3.77–5.28)
RDW: 12.4 % (ref 11.7–15.4)
WBC: 9.5 10*3/uL (ref 3.4–10.8)

## 2018-07-11 NOTE — Chronic Care Management (AMB) (Signed)
  Care Management Note   EMMERSON SHUFFIELD is a 63 y.o. year old female who sees [name], [place] for primary care. Ms. Laurance Flatten asked the CM team to consult with the patient for assistance with complex case management due to multiple health conditions as well as community resource needs related to housing.   Review of patient status, including review of consultants reports, and collaboration with appropriate care team members and the patient's provider was performed as part of comprehensive patient evaluation and provision of chronic care management services. Telephone outreach to patient today to introduce CCM services.   I reached out to [name] by phone today. Unfortunately, the patients home number listed in demographics [phone]) is to [facility]. SW was told the patient was unavailable and to call back at a later time. SW placed a call to the patients cell number [phone]). Automated response "the number you have dialed is currently not working".   Follow Up Plan: SW will attempt to contact the patient by phone within the next week to follow up on current CCM referral.  [name], BSW, CDP [facility] / [place] Management Social Worker [phone]

## 2018-07-12 ENCOUNTER — Ambulatory Visit: Payer: Self-pay

## 2018-07-12 ENCOUNTER — Telehealth: Payer: Self-pay

## 2018-07-12 DIAGNOSIS — J439 Emphysema, unspecified: Secondary | ICD-10-CM

## 2018-07-12 DIAGNOSIS — R030 Elevated blood-pressure reading, without diagnosis of hypertension: Secondary | ICD-10-CM

## 2018-07-12 DIAGNOSIS — Z8674 Personal history of sudden cardiac arrest: Secondary | ICD-10-CM

## 2018-07-12 DIAGNOSIS — R7303 Prediabetes: Secondary | ICD-10-CM

## 2018-07-12 DIAGNOSIS — I509 Heart failure, unspecified: Secondary | ICD-10-CM

## 2018-07-12 NOTE — Chronic Care Management (AMB) (Signed)
  Care Management   Note  07/12/2018 Name: Kim [name] MRN: [id number] DOB: Dec 16, 1955   Chronic Care Management   Outreach Note  07/12/2018 Name: Kim [name] MRN: [id number] DOB: 1955-09-23  Referred by: [name], [place] Reason for referral : Care Coordination ([place] OUTREACH #2)   Second unsuccessful telephone outreach to [name] was attempted today. [name] was referred to the case management team by [name], FNP for assistance with disease management for multiple complex comorbidities and community resources for housing.   The mobile phone number listed for patient does not have a voicemail box and recording states "the person you are trying to call is unavailable". The home number listed for the patient connects to the [facility], a verbal message was left with the front desk person to return the call to myself, advised working with [name] and my direct cell phone number was provided.    Follow Up Plan: Telephone follow-up call with CCM team member scheduled for: week of 07/15/18  Barb Merino, [facility] Care Management Coordinator [place] [facility]  Direct Phone: [phone]

## 2018-07-15 ENCOUNTER — Ambulatory Visit: Payer: Self-pay

## 2018-07-15 DIAGNOSIS — J449 Chronic obstructive pulmonary disease, unspecified: Secondary | ICD-10-CM

## 2018-07-15 DIAGNOSIS — I469 Cardiac arrest, cause unspecified: Secondary | ICD-10-CM

## 2018-07-15 DIAGNOSIS — I509 Heart failure, unspecified: Secondary | ICD-10-CM

## 2018-07-15 DIAGNOSIS — Z8674 Personal history of sudden cardiac arrest: Secondary | ICD-10-CM

## 2018-07-15 DIAGNOSIS — J439 Emphysema, unspecified: Secondary | ICD-10-CM

## 2018-07-15 NOTE — Patient Instructions (Signed)
Social Worker Visit Information  Thank you for speaking with me today. I look forward to meeting with you on Thursday.  Goals we discussed today:  Goals Addressed    . "I need a job" (pt-stated)       Current Barriers:  . Concerns surrounding physical abilities after recent cardiac episode  Clinical Social Work Clinical Goal(s):  [name] Over the next 30 days, client will work with SW to address concerns related to job placement  Interventions: . Discussed plans with patient for ongoing care management follow up and provided patient with direct contact information for care management team  Patient Self Care Activities:  . Performs ADL's independently . Calls provider office for new concerns or questions   Plan:  . Patient will meet with SW as scheduled on 2/27 to discuss job placement resources . Social Worker will link the patient to community agencies for assistance with job placement  *initial goal documentation    . "I need housing" (pt-stated)       Current Barriers:  . Housing barriers . Financial constraints  Clinical Social Work Clinical Goal(s):  [name] Over the next 30 days, client will work with SW to address concerns related to housing . Over the next 7 days, client will meet with CCM SW to discuss housing barriers  Interventions: . Patient interviewed and appropriate assessments performed  Patient Self Care Activities:  . Calls provider office for new concerns or questions . Performs ADL's independently  Plan:  . Patient will attend scheduled appointment to meet with CCM SW on 07/18/18  *initial goal documentation     Materials provided: No  Ms. Everitt was given information about Chronic Care Management services today including:  1. CCM service includes personalized support from designated clinical staff supervised by her physician, including individualized plan of care and coordination with other care providers 2. 24/7 contact phone numbers for assistance for  urgent and routine care needs. 3. Service will only be billed when office clinical staff spend 20 minutes or more in a month to coordinate care. 4. Only one practitioner may furnish and bill the service in a calendar month. 5. The patient may stop CCM services at any time (effective at the end of the month) by phone call to the office staff. 6. The patient will be responsible for cost sharing (co-pay) of up to 20% of the service fee (after annual deductible is met).  Patient agreed to services and verbal consent obtained.   The patient verbalized understanding of instructions provided today and declined a print copy of patient instruction materials.   Follow up plan: Face to Face appointment with CCM team scheduled for: Thursday 2/27 at 1:30 pm   [name], BSW, CDP [facility] / [facility] Care Management Social Worker [phone]

## 2018-07-15 NOTE — Chronic Care Management (AMB) (Signed)
  Care Management Note   Kim [name] is a 63 y.o. year old female who sees [name], [place] for primary care. Jance asked the CCM team to consult the patient for assistance with chronic disease management related to CAD, Myocardial infarct, COPD with emphysema, and resources needed for housing.   Review of patient status, including review of consultants reports, relevant laboratory and other test results, and collaboration with appropriate care team members and the patient's provider was performed as part of comprehensive patient evaluation and provision of chronic care management services. Telephone outreach to patient today to introduce CCM services.   I spoke to Kim [name] by phone today.   Kim [name] was given information about Chronic Care Management services today including:  1. CCM service includes personalized support from designated clinical staff supervised by her physician, including individualized plan of care and coordination with other care providers 2. 24/7 contact phone numbers for assistance for urgent and routine care needs. 3. Service will only be billed when office clinical staff spend 20 minutes or more in a month to coordinate care. 4. Only one practitioner may furnish and bill the service in a calendar month. 5. The patient may stop CCM services at any time (effective at the end of the month) by phone call to the office staff. 6. The patient will be responsible for cost sharing (co-pay) of up to 20% of the service fee (after annual deductible is met).  Patient agreed to services and verbal consent obtained.    Follow Up Plan: Face to Face appointment with CCM team member scheduled for: 07/18/18 _0 :Twilight, RN,CCM Care Management Coordinator [place] [facility]  Direct Phone: [phone]

## 2018-07-15 NOTE — Chronic Care Management (AMB) (Signed)
  Chronic Care Management    Clinical Social Work CCM Outreach Note  07/15/2018 Name: Kim [name] MRN: [id number] DOB: 1956/03/24  SW attempted outreach to [name] today by phone to introduce and offer CCM services. SW left a message with [employer] receptionist providing SW name and contact number. SW requested [name] return call. Receptionist reported she would provide [name] with message today "once the lobby re-opens".  Follow Up Plan: This is the third unsuccessful attempt to outreach the patient regarding referral to CCM services. SW has consulted with embedded clinic RN Care Manager, Angel Little. CCM team will plan to close patient referral due to an inability to establish contact if the patient does not return call within the next four days.  [name], BSW, CDP [facility] / [facility] Care Management Social Worker [phone]

## 2018-07-15 NOTE — Chronic Care Management (AMB) (Signed)
  Care Management Note   Kim [name] is a 63 y.o. year old female who sees Kim [name], [place] for primary care. Kim [name] asked Kim CM team to consult with Kim [name] for assistance with Community Resources related to housing.   Return call from Kim [name] to Kim [name] completed a joint call with CCM RN Care Manager, Kim [name].  Kim [name] was given information about Chronic Care Management services today including:  1. CCM service includes personalized support from designated clinical staff supervised by her physician, including individualized plan of care and coordination with other care providers 2. 24/7 contact phone numbers for assistance for urgent and routine care needs. 3. Service will only be billed when office clinical staff spend 20 minutes or more in a month to coordinate care. 4. Only one practitioner may furnish and bill Kim service in a calendar month. 5. Kim [name] may stop CCM services at any time (effective at Kim end of Kim month) by phone call to Kim office staff. 6. Kim [name] will be responsible for cost sharing (co-pay) of up to 20% of Kim service fee (after annual deductible is met).  [name] agreed to services and verbal consent obtained.    Goals    . "I need a job" (pt-stated)     Current Barriers:  . Concerns surrounding physical abilities after recent cardiac episode  Clinical Social Work Clinical Goal(s):  [name] Over Kim next 30 days, client will work with SW to address concerns related to job placement  Interventions: . Discussed plans with [name] for ongoing care management follow up and provided [name] with direct contact information for care management team  [name] Self Care Activities:  . Performs ADL's independently . Calls provider office for new concerns or questions   Plan:  . [name] will meet with SW as scheduled on 2/27 to discuss job placement resources . Social Worker will link Kim [name] to community agencies for assistance  with job placement  *initial goal documentation       . "I need housing" (pt-stated)     Current Barriers:  . Housing barriers . Financial constraints  Clinical Social Work Clinical Goal(s):  [name] Over Kim next 30 days, client will work with SW to address concerns related to housing . Over Kim next 7 days, client will meet with CCM SW to discuss housing barriers  Interventions: . [name] interviewed and appropriate assessments performed  [name] Self Care Activities:  . Calls provider office for new concerns or questions . Performs ADL's independently  Plan:  . [name] will attend scheduled appointment to meet with CCM SW on 07/18/18  *initial goal documentation     Follow Up Plan: Face to Face appointment with CCM team member scheduled for: 07/18/18 at 1:30 pm  [name], BSW, CDP [facility] / [facility] Care Management Social Worker [phone]  Total time spent performing care coordination and/or care management activities with Kim [name] by phone or face to face = 10 minutes.

## 2018-07-16 NOTE — Patient Instructions (Addendum)
Visit Information  Goals    . "I am having sinus drainage that is causing too much phlem" (pt-stated)     Current Barriers:   Knowledge Deficit related to disease process and self health management of sinusitis  Patient is a smoker  Nurse Case Manager Clinical Goal(s):  [name] Over the next 30 days patient will have reported s/s suggestive of chronic sinusitis to PCP and have a treatment plan in place.   Interventions:   Telephone collaboration with patient today for introduction of CCM services (patient returned phone call from [facility] team) . Collaborated with Earl Lites, FNP via in basket message with notification of patient's c/o of s/s suggestive of chronic sinusitis to (be further evaluated at PCP visit on 07/18/18) . Scheduled initial face to face visit with the CCM team for 07/18/18 @1 :30 PM (pt will drive self or take bus)    Patient Self Care Activities:  Verbalizes understanding of scheduled CCM FACE TO FACE VISIT set for 07/18/18 @1 :30 PM prior to PCP follow-up  Plan:  . The CCM team will meet with patient [place] to assess for CM/SW needs on 07/18/18 @1 :30PM prior to visit with [name], FNP  *initial goal documentation      . "I need a job" (pt-stated)     Current Barriers:  . Concerns surrounding physical abilities after recent cardiac episode  Clinical Social Work Clinical Goal(s):  [name] Over the next 30 days, client will work with SW to address concerns related to job placement  Interventions: . Discussed plans with patient for ongoing care management follow up and provided patient with direct contact information for care management team  Patient Self Care Activities:  . Performs ADL's independently . Calls provider office for new concerns or questions   Plan:  . Patient will meet with SW as scheduled on 2/27 to discuss job placement resources . Social Worker will link the patient to community agencies for assistance with job placement  *initial goal  documentation     . "I need a referral to a Cardiologist" (pt-stated)     Current Barriers:  [name] Knowledge Deficit related to post discharge follow-up with Cardiology (referral needed) . 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   Interventions:   Telephone collaboration with patient today for introduction of CCM services (patient returned phone call from [facility] team) . Collaborated with Earl Lites, FNP via in basket message requesting a Cardiology referral for this patient  . Scheduled initial face to face visit with the CCM team for 07/18/18 @1 :30 PM (pt will drive self or take bus)    Patient Self Care Activities:  . Patient verbalizes understanding of her planned face to face with the CCM team set for 07/18/18 @1 :30 PM prior to f/u with [name], FNP   Plan:  . Patient will call the CCM any questions or concerns or need to reschedule her face to face visit   *initial goal documentation      . "I need housing" (pt-stated)     Current Barriers:  . Housing barriers . Financial constraints  Clinical Social Work Clinical Goal(s):  [name] Over the next 30 days, client will work with SW to address concerns related to housing . Over the next 7 days, client will meet with CCM SW to discuss housing  barriers  Interventions: . Patient interviewed and appropriate assessments performed  Patient Self Care Activities:  . Calls provider office for new concerns or questions . Performs ADL's independently  Plan:  . Patient will attend scheduled appointment to meet with CCM SW on 07/18/18  *initial goal documentation     Face to Face appointment with CCM team member scheduled for: 07/18/18 @1 :37 PM    Barb Merino, [facility] Care Management Coordinator [place] [facility]  Direct Phone: [phone]

## 2018-07-18 ENCOUNTER — Other Ambulatory Visit: Payer: Self-pay

## 2018-07-18 ENCOUNTER — Ambulatory Visit (INDEPENDENT_AMBULATORY_CARE_PROVIDER_SITE_OTHER): Payer: Medicare HMO

## 2018-07-18 ENCOUNTER — Encounter: Payer: Self-pay | Admitting: Nurse Practitioner

## 2018-07-18 ENCOUNTER — Ambulatory Visit (INDEPENDENT_AMBULATORY_CARE_PROVIDER_SITE_OTHER): Payer: Medicare HMO | Admitting: Nurse Practitioner

## 2018-07-18 ENCOUNTER — Ambulatory Visit: Payer: Self-pay

## 2018-07-18 VITALS — BP 128/72 | HR 81 | Temp 97.8°F | Ht 58.6 in | Wt 154.8 lb

## 2018-07-18 DIAGNOSIS — J449 Chronic obstructive pulmonary disease, unspecified: Secondary | ICD-10-CM

## 2018-07-18 DIAGNOSIS — Z8679 Personal history of other diseases of the circulatory system: Secondary | ICD-10-CM

## 2018-07-18 DIAGNOSIS — I509 Heart failure, unspecified: Secondary | ICD-10-CM

## 2018-07-18 DIAGNOSIS — R030 Elevated blood-pressure reading, without diagnosis of hypertension: Secondary | ICD-10-CM

## 2018-07-18 DIAGNOSIS — I2510 Atherosclerotic heart disease of native coronary artery without angina pectoris: Secondary | ICD-10-CM

## 2018-07-18 DIAGNOSIS — J439 Emphysema, unspecified: Secondary | ICD-10-CM

## 2018-07-18 DIAGNOSIS — I469 Cardiac arrest, cause unspecified: Secondary | ICD-10-CM

## 2018-07-18 DIAGNOSIS — R059 Cough, unspecified: Secondary | ICD-10-CM

## 2018-07-18 DIAGNOSIS — R7303 Prediabetes: Secondary | ICD-10-CM

## 2018-07-18 DIAGNOSIS — Z8674 Personal history of sudden cardiac arrest: Secondary | ICD-10-CM

## 2018-07-18 DIAGNOSIS — R05 Cough: Secondary | ICD-10-CM

## 2018-07-18 MED ORDER — ALBUTEROL SULFATE HFA 108 (90 BASE) MCG/ACT IN AERS: 2.0000 | INHALATION_SPRAY | Freq: Four times a day (QID) | RESPIRATORY_TRACT | 2 refills | Status: DC | PRN

## 2018-07-18 NOTE — Chronic Care Management (AMB) (Signed)
  Chronic Care Management    Clinical Social Work Follow Up Note  07/18/2018 Name: CHAVA DULAC MRN: [id number] DOB: [birth year]   Referred by: PCP, [name], FNP for Care Coordination (Initial Face to Face encounter) related to housing and community resource needs..   Review of patient status, including review of consultants reports, other relevant assessments, and collaboration with appropriate care team members and the patient's provider was performed as part of comprehensive patient evaluation and provision of chronic care management services.    Last CCM Appointment: 07/15/18 with CCM  RNCM and CCM SW via phone call  SW has discussed social determinants of health with the patient. The patient has challenges related to housing and food.    Goals Addressed      Patient Stated   . "I need housing" (pt-stated)   On track    Current Barriers:  . Housing barriers . Financial constraints  Clinical Social Work Clinical Goal(s):  [name] Over the next 30 days, client will work with SW to address concerns related to housing . Over the next 7 days, client will meet with CCM SW to discuss housing barriers - Completed  Interventions: . Patient interviewed and appropriate assessments performed  Patient Self Care Activities:  . Calls provider office for new concerns or questions . Performs ADL's independently  . Patient is actively working with case manager at the homeless shelter to seek housing  Plan:  . Patient will contact SW over the next week to update on transitional housing application outcome.  Please see past updates related to this goal by clicking on the "Past Updates" button in the selected goal      . "I want to use the [place]" (pt-stated)       Current Barriers:  . Limited education about [place] . Limited social support  Clinical Social Work Clinical Goal(s):  [name] Over the next 15 days, client will follow up with [facility] mail order pharmacy as  directed by SW  Interventions: . Patient interviewed and appropriate assessments performed . Provided patient with information about [facility] mail order pharmacy  Patient Self Care Activities:  . Performs ADL's independently . Calls provider office for new concerns or questions  Plan:  . Patient will speak with case manager at the shelter regarding ability to have medications delivered . Patient will contact [facility] mail order pharmacy to provide an address for medications to be delivered . Social Worker willfollow up with the patient in the next two weeks regarding patient ability to contact [employer] order pharmacy  *initial goal documentation         Follow Up Plan: Appointment scheduled for SW follow up with client in the next week.   [name], BSW, CDP [facility] / [facility] Care Management Social Worker [phone]  Total time spent performing care coordination and/or care management activities with the patient by phone or face to face = 60 minutes.

## 2018-07-18 NOTE — Patient Instructions (Signed)
Social Worker Visit Information  Goals we discussed today:  Goals Addressed            This Visit's Progress     Patient Stated   . "I need housing" (pt-stated)   On track    Current Barriers:  . Housing barriers . Financial constraints  Clinical Social Work Clinical Goal(s):  [name] Over the next 30 days, client will work with SW to address concerns related to housing . Over the next 7 days, client will meet with CCM SW to discuss housing barriers - Completed  Interventions: . Patient interviewed and appropriate assessments performed  Patient Self Care Activities:  . Calls provider office for new concerns or questions . Performs ADL's independently  . Patient is actively working with case manager at the homeless shelter to seek housing  Plan:  . Patient will contact SW over the next week to update on transitional housing application outcome.  Please see past updates related to this goal by clicking on the "Past Updates" button in the selected goal     . "I want to use the [place]" (pt-stated)       Current Barriers:  . Limited education about [place] . Limited social support  Clinical Social Work Clinical Goal(s):  [name] Over the next 15 days, client will follow up with [facility] mail order pharmacy as directed by SW  Interventions: . Patient interviewed and appropriate assessments performed . Provided patient with information about [facility] mail order pharmacy  Patient Self Care Activities:  . Performs ADL's independently . Calls provider office for new concerns or questions  Plan:  . Patient will speak with case manager at the shelter regarding ability to have medications delivered . Patient will contact [facility] mail order pharmacy to provide an address for medications to be delivered . Social Worker willfollow up with the patient in the next two weeks regarding patient ability to contact [facility] order pharmacy  *initial goal documentation         Materials Provided: Yes patient provided with information on [facility] order pharmacy   Follow Up Plan: Appointment scheduled for SW follow up with client by phone within the next week   [name], BSW, CDP [facility] / [place] [facility] Social Worker [phone]

## 2018-07-18 NOTE — Progress Notes (Signed)
Subjective:     Patient ID: Kim [name] , female    DOB: 03-28-1956 , 63 y.o.   MRN: [id number]   Chief Complaint  Patient presents with  . Hypertension    HPI  Hypertension  This is a chronic problem. The current episode started more than 1 year ago. The problem is unchanged. The problem is controlled. Pertinent negatives include no anxiety, blurred vision, chest pain, headaches or palpitations. There are no associated agents to hypertension. There are no known risk factors for coronary artery disease. Past treatments include nothing. There are no compliance problems.  There is no history of angina. There is no history of chronic renal disease.     Past Medical History:  Diagnosis Date  . CHF (congestive heart failure) ([place])   . COPD (chronic obstructive pulmonary disease) ([place])   . Diabetes mellitus without complication ([place])   . GERD (gastroesophageal reflux disease)   . History of DVT (deep vein thrombosis)   . Hypercholesteremia   . Hyperlipemia   . Normal coronary arteries    by cardiac catheterization which I performed September 2015  . Schizophrenia ([place])   . Tobacco abuse      No family history on file.   Current Outpatient Medications:  .  benzonatate (TESSALON PERLES) 100 MG capsule, Take 1 capsule (100 mg total) by mouth every 6 (six) hours as needed for cough. (Patient not taking: Reported on 07/18/2018), Disp: 30 capsule, Rfl: 1 .  cholecalciferol (VITAMIN D) 1000 units tablet, Take 1,000 Units by mouth daily., Disp: , Rfl:  .  Fluticasone-Salmeterol (ADVAIR) 250-50 MCG/DOSE AEPB, Inhale 1 puff into the lungs 2 (two) times daily. (Patient not taking: Reported on 07/18/2018), Disp: 60 each, Rfl: 5 .  furosemide (LASIX) 20 MG tablet, TAKE 1 OR 2 TABLETS BY MOUTH AS NEEDED (Patient not taking: Reported on 07/10/2018), Disp: 90 tablet, Rfl: 1 .  naproxen (NAPROSYN) 375 MG tablet, Take 1 tablet (375 mg total) by mouth 2 (two) times daily as needed for mild pain or  moderate pain. (Patient not taking: Reported on 07/18/2018), Disp: 60 tablet, Rfl: 0   Allergies  Allergen Reactions  . Asa [Aspirin] Other (See Comments)    Stomach pain  . Metformin And Related Other (See Comments)    STOMACH PROBLEMS  . Penicillins Nausea And Vomiting    Large doses  . Quetiapine Other (See Comments)    REACTION: nausea/dizzy  . Acetaminophen Nausea And Vomiting and Rash     Review of Systems  Constitutional: Negative for fatigue.  Eyes: Negative for blurred vision.  Respiratory: Positive for cough (productive). Negative for wheezing.   Cardiovascular: Negative for chest pain, palpitations and leg swelling.  Endocrine: Negative for polydipsia, polyphagia and polyuria.  Neurological: Negative for dizziness and headaches.    Today's Vitals   07/18/18 1410  BP: 128/72  Pulse: 81  Temp: 97.8 F (36.6 C)  TempSrc: Oral  SpO2: 93%  Weight: 154 lb 12.8 oz (70.2 kg)  Height: 4' 10.6" (1.488 m)   Body mass index is 31.69 kg/m.   Objective:  Physical Exam Vitals signs reviewed.  Constitutional:      Appearance: Normal appearance.  Cardiovascular:     Rate and Rhythm: Normal rate and regular rhythm.     Pulses: Normal pulses.     Heart sounds: Normal heart sounds. No murmur.  Pulmonary:     Effort: Pulmonary effort is normal.     Breath sounds: Examination of the right-lower field  reveals rhonchi. Examination of the left-lower field reveals rhonchi. Rhonchi present.  Neurological:     Mental Status: She is alert.         Assessment And Plan:     1. Chronic obstructive pulmonary disease, unspecified COPD type ([place])  She is reluctant to take Advair,   Albuterol inhaler sent in to  - albuterol (PROVENTIL HFA;VENTOLIN HFA) 108 (90 Base) MCG/ACT inhaler; Inhale 2 puffs into the lungs every 6 (six) hours as needed for wheezing or shortness of breath.  Dispense: 1 Inhaler; Refill: 2  2. Blood pressure elevated without history of HTN  Improved and  back to normal.       [name], FNP

## 2018-07-18 NOTE — Chronic Care Management (AMB) (Signed)
  Chronic [name] Management   Initial Visit Note  07/18/2018 Name: Kim [name] MRN: [id number] DOB: 1955/10/07  Referred by: [name], FNP Reason for referral : Chronic [name] Management (INITIAL CCM FACE TO FACE VISIT)   Subjective: "I need a referral for a Cardiologist" "I stopped taking my medications"  Objective:  Lab Results  Component Value Date   HGBA1C 6.5 (H) 06/11/2018   HGBA1C 5.7 (H) 03/13/2018   Lab Results  Component Value Date   CREATININE 0.58 07/10/2018   BP Readings from Last 3 Encounters:  07/18/18 128/72  07/10/18 (!) 160/110  06/12/18 (!) 102/56   Last Annual Wellness Visit Date: Completed on 05/01/17  Next Scheduled Annual Wellness Visit Date: past due, pending; in basket message sent to AWV scheduler Janace Hoard, requesting appt be scheduled  # ED visits/last 6 months: 2 # IP Hospitalizations/ last 6 months: 1  Assessment: Kim [name] is a 63 y.o. year old female who sees [name], FNP for primary [name]. Kim [name] asked the CCM team to consult the patient for assistance with chronic disease management related to COPD, Hyperlipidemia, and recent IP event for Myocardial Infarction. Patient will also benefit from SW services for housing and other community resources.   Review of patient status, including review of consultants reports, relevant laboratory and other test results, and collaboration with appropriate [name] team members and the patient's provider was performed as part of comprehensive patient evaluation and provision of chronic [name] management services.    The CCM team met with Kim [name] today for an initial CCM face to face encounter.    Telephone follow up appointment with CCM team member scheduled for: and The CM team will reach out to the patient again over the next 7-14 days days.    Kim [name] was given information about Chronic [name] Management services today including:  1. CCM service includes personalized support from  designated clinical staff supervised by her physician, including individualized plan of [name] and coordination with other [name] providers 2. 24/7 contact phone numbers for assistance for urgent and routine [name] needs. 3. Service will only be billed when office clinical staff spend 20 minutes or more in a month to coordinate [name]. 4. Only one practitioner may furnish and bill the service in a calendar month. 5. The patient may stop CCM services at any time (effective at the end of the month) by phone call to the office staff. 6. The patient will be responsible for cost sharing (co-pay) of up to 20% of the service fee (after annual deductible is met).  Patient agreed to services and verbal consent obtained.    Barb Merino, RN,CCM [name] Management Coordinator [place] [facility]  Direct Phone: [phone]

## 2018-07-19 NOTE — Patient Instructions (Signed)
Visit Information  Goals      Patient Stated   . "I have cough and congestion" "I will only use the Albuterol inhaler" (pt-stated)     Current Barriers:  . Non-adherence to taking medications as presribed   Nurse Case Manager Clinical Goal(s):   Over the next 30 days, patient will have no ED visits or IP admissions secondary to COPD exacerbation.  Interventions:   Face to Face CCM visit completed with patient . Evaluation of current treatment plan related to COPD and patient's adherence to plan as established by provider.  . Verbal education given related the disease process of COPD . Verbal education given related to patient's prescribed inhalers Advair and Albuterol and the importance of taking these medications exactly as prescribed for best management of COPD . Provided patient with the COPD action zone tool and reviewed symptom management with patient and when to call the doctor or 911 . Collaboration with provider [name], FNP re: patient's refusal to take her prescribed meds including Advair, reported observed symptoms of productive cough during visit . Provided patient with the contact numbers for CCM team; provided [facility] 24 nurse phone line # magnet and instructed patient accordingly . Telephone CCM follow-up scheduled with patient for 7-10 days  Patient Self Care Activities:   Patient verbalizes understanding of today's instructions/education provided  Patient is able to drive her self to her MD appointments or utilize the bus system  Patient adheres to MD follow-up appointments  Patient is not taking her medications as prescribed due to states, "I don't like taking medicines and some of them are not good for me"  Plan:   Telephone CCM follow-up scheduled with patient in 7-10 days . Patient will call the CCM team any questions or concerns that may arise before her next CCM f/u  *initial goal documentation    . "I need a job" (pt-stated)     Current Barriers:   . Concerns surrounding physical abilities after recent cardiac episode  Clinical Social Work Clinical Goal(s):  [name] Over the next 30 days, client will work with SW to address concerns related to job placement  Interventions: . Discussed plans with patient for ongoing care management follow up and provided patient with direct contact information for care management team  Patient Self Care Activities:  . Performs ADL's independently . Calls provider office for new concerns or questions   Plan:  . Patient will meet with SW as scheduled on 2/27 to discuss job placement resources . Social Worker will link the patient to community agencies for assistance with job placement  *initial goal documentation    . "I need a referral to a Cardiologist" (pt-stated)     Current Barriers:  [name] Knowledge Deficit related to post discharge follow-up with Cardiology (referral needed) . 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   Interventions:   Face to Face visit completed with patient  Checked resting BP (left arm) during visit; 130/80; provider [name], FNP made aware  Medication reconciliation completed with patient (states her medicines are in her car)(Pt admits she is only going to use the Albuterol inhaler but will not take any other medications)  Verbal education provided about the indication/dose for each medication and the importance of taking these medications as prescribed . Collaborated with Earl Lites, FNP  re: Cardiology referral for this patient and this is pending . Advised patient her Cardiology appointment is being worked on and she will be notified once an appointment is scheduled . Patient was provided RNCM contact number; [facility] 24 hour nurse advice line phone number (magnet) provided to  patient  . Telephone CCM follow-up scheduled with patient     Patient Self Care Activities:   Patient verbalizes understanding of today's instructions/education provided  Patient is able to drive her self to her MD appointments or utilize the bus system  Patient adheres to MD follow-up  Patient is not taking her medications as prescribed due to states, "I don't like taking medicines and some of them are not good for me"  Plan:  . Patient will call the CCM team any questions or concerns that may arise before her next CCM f/u   Please see past updates related to this goal by clicking on the "Past Updates" button in the selected goal     . "I need housing" (pt-stated)     Current Barriers:  . Housing barriers . Financial constraints  Clinical Social Work Clinical Goal(s):  [name] Over the next 30 days, client will work with SW to address concerns related to housing . Over the next 7 days, client will meet with CCM SW to discuss housing barriers - Completed  Interventions: . Patient interviewed and appropriate assessments performed  Patient Self Care Activities:  . Calls provider office for new concerns or questions . Performs ADL's independently  . Patient is actively working with case manager at the homeless shelter to seek housing  Plan:  . Patient will contact SW over the next week to update on transitional housing application outcome.  Please see past updates related to this goal by clicking on the "Past Updates" button in the selected goal     . "I want to use the [facility]" (pt-stated)     Current Barriers:  . Limited education about [place] . Limited social support  Clinical Social Work Clinical Goal(s):  [name] Over the next 15 days, client will follow up with [facility] mail order pharmacy as directed by SW  Interventions: . Patient interviewed and appropriate assessments performed . Provided patient with information about [facility] mail order  pharmacy  Patient Self Care Activities:  . Performs ADL's independently . Calls provider office for new concerns or questions  Plan:  . Patient will speak with case manager at the shelter regarding ability to have medications delivered . Patient will contact [facility] mail order pharmacy to provide an address for medications to be delivered . Social Worker willfollow up with the patient in the next two weeks regarding patient ability to contact [facility] order pharmacy  *initial goal documentation     Education or Materials Provided:  . [facility] calendar notebook  . COPD zone action tool  Ms. Yassin was given information about Chronic Care Management services today including:  1. CCM service includes personalized support from designated clinical staff supervised by her physician, including individualized plan of care and coordination with other care providers 2. 24/7 contact phone numbers for assistance for urgent and routine care needs. 3. Service will only be billed when office clinical staff spend 20 minutes or more in a month to coordinate care. 4. Only one practitioner may furnish and bill the service in a calendar month. 5. The patient may stop CCM services at any time (effective at the end of the month) by phone call to  the office staff. 6. The patient will be responsible for cost sharing (co-pay) of up to 20% of the service fee (after annual deductible is met).  Patient agreed to services and verbal consent obtained.   The patient verbalized understanding of instructions provided today and declined a print copy of patient instruction materials.   The CM team will reach out to the patient again over the next 7-10 days days.   Barb Merino, RN,CCM Care Management Coordinator [place] [facility]  Direct Phone: [phone]

## 2018-07-23 ENCOUNTER — Other Ambulatory Visit: Payer: Self-pay

## 2018-07-23 DIAGNOSIS — Z8674 Personal history of sudden cardiac arrest: Secondary | ICD-10-CM

## 2018-07-23 DIAGNOSIS — I252 Old myocardial infarction: Secondary | ICD-10-CM

## 2018-07-23 DIAGNOSIS — I2510 Atherosclerotic heart disease of native coronary artery without angina pectoris: Secondary | ICD-10-CM

## 2018-07-23 NOTE — Telephone Encounter (Addendum)
Called to schedule Medicare Annual Wellness Visit with the Nurse Health Advisor. Ms. Alleyne was not there.  Left message with receptionist at the [facility] for patient to call Lattie Haw at [phone].  If patient returns call, please note: their last AWV was on 05/01/2017,  please schedule AWV with [facility] any date AFTER 05/01/2018.  Thank you! For any questions please contact: Janace Hoard at [phone] or Skype [email or web address]

## 2018-07-23 NOTE — Telephone Encounter (Signed)
Patient returned my phone call and set up appointment for Medicare Annual Wellness Visit and Office Visit on 08/08/2018 at 3:00 PM for AWV and 3:45 PM for Office Visit.  Patient voiced understood.  Janace Hoard, Care Guide.

## 2018-07-25 ENCOUNTER — Telehealth: Payer: Self-pay

## 2018-07-26 ENCOUNTER — Telehealth: Payer: Self-pay

## 2018-07-26 ENCOUNTER — Ambulatory Visit (INDEPENDENT_AMBULATORY_CARE_PROVIDER_SITE_OTHER): Payer: Medicare HMO

## 2018-07-26 ENCOUNTER — Ambulatory Visit: Payer: Self-pay

## 2018-07-26 DIAGNOSIS — I469 Cardiac arrest, cause unspecified: Secondary | ICD-10-CM

## 2018-07-26 DIAGNOSIS — J449 Chronic obstructive pulmonary disease, unspecified: Secondary | ICD-10-CM

## 2018-07-26 DIAGNOSIS — I509 Heart failure, unspecified: Secondary | ICD-10-CM

## 2018-07-26 DIAGNOSIS — R030 Elevated blood-pressure reading, without diagnosis of hypertension: Secondary | ICD-10-CM

## 2018-07-26 NOTE — Patient Instructions (Signed)
Visit Information  Goals Addressed      Patient Stated   . "I have cough and congestion" "I will only use the Albuterol inhaler" (pt-stated)   On track    Current Barriers:  . Non-adherence to taking medications as presribed   Nurse Case Manager Clinical Goal(s):   Over the next 30 days, patient will have no ED visits or IP admissions secondary to COPD exacerbation.  Interventions:   Telephone CCM follow-up with patient and SW [facility]  Assessed respiratory status, no noted audible cough and congestion during call and patient states symptoms have resolved  Assessed for adherence to refilling/using Albuterol inhaler (pt states she has not picked it up from the pharmacy but will do so)  Reinforced the importance of patient filling Rx for Albuterol to have on hand in case needed for rescue inhaler  Answered patient questions related to Coronavirus  Provided patient with CDC recommendations to help prevent contracting the virus, including using good handwashing with antibacterial soap and friction for 20 seconds or longer, washing hands frequently, avoid putting hands around mouth or nose, avoid large crowds of people if possible, avoid being in the line of spray of someone coughing/sneezing, using an alcohol based hand sanitizer in addition to handwashing, reporting any symptoms of cough/congestion, fever, general malaise as soon as symptoms begin  Reassured patient according to WHO (world health organization, [place] is at low risk for having a Coronavirus epidemic  Confirmed patient has contact numbers for the CCM team and or provider  Encouraged patient to call if needed  Scheduled CCM follow up with patient following her Cardiology appt   Patient Self Care Activities:   Patient verbalizes understanding of today's instructions/education provided  Patient is able to drive her self to her MD appointments or utilize the bus system  Patient adheres to MD follow-up  appointments  Patient is not taking her medications as prescribed due to states, "I don't like taking medicines and some of them are not good for me"  Plan:   The CCM team will follow-up with patient face to face following her AWV set for 08/08/18 @4 :00 PM.   Telephone CCM follow-up scheduled with patient following her Cardiology follow-up set for 08/09/18  Patient will keep all MD appointment scheduled including her new patient Cardiology appt . Patient will call the CCM team any questions or concerns that may arise before her next CCM f/u  Please see past updates related to this goal by clicking on the "Past Updates" button in the selected goal     . "I need a referral to a Cardiologist" (pt-stated)   On track    Current Barriers:  [name] Knowledge Deficit related to post discharge follow-up with Cardiology (referral needed) . 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   Interventions:   Telephone CCM follow-up completed with patient and [place] patient of her new patient Cardiology appointment scheduled for 08/09/18 @3 :00 PM with Dr. [name]  Confirmed patient has the contact # for CCM team member's and provider's office   Telephone CCM follow-up scheduled with patient following her Cardiology    Patient Self Care Activities:   Patient verbalizes understanding of today's instructions/education provided  Patient is able to drive her self to her MD appointments or utilize the bus system  Patient adheres to MD follow-up  Patient is not taking her medications as prescribed due to states, "I don't like taking medicines and some of them are not good for me"  Plan:  . Patient will call the CCM team any questions or concerns that may arise before her next CCM f/u . Telephone  CCM follow-up set for   Please see past updates related to this goal by clicking on the "Past Updates" button in the selected goal      Education or Materials Provided:  Verbal education provided by phone; about s/s of infection specific to the Coronavirus; CDC prevention tips given specifically on how to avoid the Coronavirus.   The patient verbalized understanding of instructions provided today and declined a print copy of patient instruction materials.   Face to Face appointment with CCM team member scheduled for: 08/08/18 @4 :00 PM following AWV.   Barb Merino, RN,CCM Care Management Coordinator [place] [facility]  Direct Phone: [phone]

## 2018-07-26 NOTE — Chronic Care Management (AMB) (Signed)
Chronic Care Management    Clinical Social Work Follow Up Note  07/26/2018 Name: Kim [name] MRN: [id number] DOB: 08-01-1955  Kim STUEVE is a 63 y.o. year old female who is a primary care patient of [name], [place].   Review of patient status, including review of consultants reports, other relevant assessments, and collaboration with appropriate care team members and the patient's provider was performed as part of comprehensive patient evaluation and provision of chronic care management services.       Goals Addressed            This Visit's Progress     Patient Stated   . "I need a job" (pt-stated)       Current Barriers:  . Concerns surrounding physical abilities after recent cardiac episode  Clinical Social Work Clinical Goal(s):  [name] Over the next 30 days, client will work with SW to address concerns related to job placement  . Over the next 20 days patient will follow up with recent cardiology referral  Interventions: . Discussed plans with patient for ongoing care management follow up and provided patient with direct contact information for care management team . Advised patient to contact SW once cardiology visit is completed  Patient Self Care Activities:  . Performs ADL's independently . Calls provider office for new concerns or questions   Plan:  . Patient will attend cardiology appointment and discuss concerns of physical ability to work after recent cardiac arrest  Please see past updates related to this goal by clicking on the "Past Updates" button in the selected goal     . "I need housing" (pt-stated)   On track    Current Barriers:  . Housing barriers . Financial constraints  Clinical Social Work Clinical Goal(s):  [name] Over the next 30 days, client will work with SW to address concerns related to housing  Interventions: . Patient interviewed and appropriate assessments performed  . Patient reports she is still at the homeless shelter and awaiting  a move in date to transitional housing. Patient reports this could take anywhere from 30 - 90 days.  Patient Self Care Activities:  . Calls provider office for new concerns or questions . Performs ADL's independently  . Patient is actively working with case manager at the homeless shelter to seek housing  Plan:  . Social Worker will follow up with the patient in the next 60 days to assess housing status.  Please see past updates related to this goal by clicking on the "Past Updates" button in the selected goal    . "I want to see a psychologist and not a psychiatrist" (pt-stated)       Current Barriers:  [name] Mental Health Concerns   . Patient is unwilling to see a psychiatrist over concerns of taking prescribed medications  Clinical Social Work Clinical Goal(s):  [name] Over the next 30 days, patient will work with referred psychologist to address needs related to mental health counseling  Interventions: . Discussed plans with patient for ongoing care management follow up and provided patient with direct contact information for care management team . Collaborated with primary care provider re: patients desire to receive a psychology referral.  . Assisted patient/caregiver with obtaining information about health plan benefits  Patient Self Care Activities:  . Self administers medications as prescribed . Attends all scheduled provider appointments . Performs ADL's independently . Performs IADL's independently  Plan:  . Social Worker will follow up with the patient in the next  week to confirm referral to psychologist has been completed  Initial goal documentation        Follow Up Plan: Appointment scheduled for SW follow up with client by phone within the next week.  [name], BSW, CDP [facility] / [facility] Care Management Social Worker [phone]  Total time spent performing care coordination and/or care management activities with the patient by phone or face to face = 20 minutes.

## 2018-07-26 NOTE — Patient Instructions (Signed)
Social Work Visit Information  Goals we discussed today:  Goals Addressed            This Visit's Progress     Patient Stated   . "I need a job" (pt-stated)       Current Barriers:  . Concerns surrounding physical abilities after recent cardiac episode  Clinical Social Work Clinical Goal(s):  [name] Over the next 30 days, client will work with SW to address concerns related to job placement  . Over the next 20 days patient will follow up with recent cardiology referral  Interventions: . Discussed plans with patient for ongoing care management follow up and provided patient with direct contact information for care management team . Advised patient to contact SW once cardiology visit is completed  Patient Self Care Activities:  . Performs ADL's independently . Calls provider office for new concerns or questions   Plan:  . Patient will attend cardiology appointment and discuss concerns of physical ability to work after recent cardiac arrest  Please see past updates related to this goal by clicking on the "Past Updates" button in the selected goal     . "I need housing" (pt-stated)   On track    Current Barriers:  . Housing barriers . Financial constraints  Clinical Social Work Clinical Goal(s):  [name] Over the next 30 days, client will work with SW to address concerns related to housing  Interventions: . Patient interviewed and appropriate assessments performed  . Patient reports she is still at the homeless shelter and awaiting a move in date to transitional housing. Patient reports this could take anywhere from 30 - 90 days.  Patient Self Care Activities:  . Calls provider office for new concerns or questions . Performs ADL's independently  . Patient is actively working with case manager at the homeless shelter to seek housing  Plan:  . Social Worker will follow up with the patient in the next 60 days to assess housing status.  Please see past updates related to this goal  by clicking on the "Past Updates" button in the selected goal     . "I want to see a psychologist and not a psychiatrist" (pt-stated)       Current Barriers:  [name] Mental Health Concerns   . Patient is unwilling to see a psychiatrist over concerns of taking prescribed medications  Clinical Social Work Clinical Goal(s):  [name] Over the next 30 days, patient will work with referred psychologist to address needs related to mental health counseling  Interventions: . Discussed plans with patient for ongoing care management follow up and provided patient with direct contact information for care management team . Collaborated with primary care provider re: patients desire to receive a psychology referral.  . Assisted patient/caregiver with obtaining information about health plan benefits  Patient Self Care Activities:  . Self administers medications as prescribed . Attends all scheduled provider appointments . Performs ADL's independently . Performs IADL's independently  Plan:  . Social Worker will follow up with the patient in the next week to confirm referral to psychologist has been completed  Initial goal documentation         Materials Provided: No: Patient declined  Follow Up Plan: Appointment scheduled for SW follow up with client by phone within the next week.   [name], BSW, CDP [facility] / [facility] Care Management Social Worker [phone]

## 2018-07-26 NOTE — Chronic Care Management (AMB) (Signed)
Chronic Care Management   Follow Up Note   07/26/2018 Name: Kim [name] MRN: [id number] DOB: [birth year]  Referred by: [name], FNP Reason for referral : Chronic Care Management (TELEPHONE CCM FOLLOW-UP )   Kim [name] is a 63 y.o. year old female who is a primary care patient of [name], [place]. The CCM team was consulted for assistance with chronic disease management and care coordination needs.    The CCM team spoke with Kim [name] by telephone today.   Goals Addressed      Patient Stated   . "I have cough and congestion" "I will only use the Albuterol inhaler" (pt-stated)   On track    Current Barriers:  . Non-adherence to taking medications as presribed   Nurse Case Manager Clinical Goal(s):   Over the next 30 days, patient will have no ED visits or IP admissions secondary to COPD exacerbation.  Interventions:   Telephone CCM follow-up with patient and SW [facility]  Assessed respiratory status, no noted audible cough and congestion during call and patient states symptoms have resolved  Assessed for adherence to refilling/using Albuterol inhaler (pt states she has not picked it up from the pharmacy but will do so)  Reinforced the importance of patient filling Rx for Albuterol to have on hand in case needed for rescue inhaler  Answered patient questions related to Coronavirus  Provided patient with CDC recommendations to help prevent contracting the virus, including using good handwashing with antibacterial soap and friction for 20 seconds or longer, washing hands frequently, avoid putting hands around mouth or nose, avoid large crowds of people if possible, avoid being in the line of spray of someone coughing/sneezing, using an alcohol based hand sanitizer in addition to handwashing, reporting any symptoms of cough/congestion, fever, general malaise as soon as symptoms begin  Reassured patient according to WHO (world health organization, [place] is  at low risk for having a Coronavirus epidemic  Confirmed patient has contact numbers for the CCM team and or provider  Encouraged patient to call if needed  Scheduled CCM follow up with patient following her Cardiology appt   Patient Self Care Activities:   Patient verbalizes understanding of today's instructions/education provided  Patient is able to drive her self to her MD appointments or utilize the bus system  Patient adheres to MD follow-up appointments  Patient is not taking her medications as prescribed due to states, "I don't like taking medicines and some of them are not good for me"  Plan:   The CCM team will follow-up with patient face to face following her AWV set for 08/08/18 @4 :00 PM.   Patient will keep all MD appointment scheduled including her new patient Cardiology appt . Patient will call the CCM team any questions or concerns that may arise before her next CCM f/u  Please see past updates related to this goal by clicking on the "Past Updates" button in the selected goal     . "I need a referral to a Cardiologist" (pt-stated)   On track    Current Barriers:  [name] Knowledge Deficit related to post discharge follow-up with Cardiology (referral needed) . 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   Interventions:   Telephone CCM follow-up completed with patient and SW Kim [name]  Advised patient of  her new patient Cardiology appointment scheduled for 08/09/18 @3 :00 PM with Dr. [name]  Confirmed patient has the contact # for CCM team member's and provider's office   The CCM team will follow-up with patient face to face following her AWV set for 08/08/18 @4 :00 PM.   Patient Self Care Activities:   Patient verbalizes understanding of today's instructions/education  provided  Patient is able to drive her self to her MD appointments or utilize the bus system  Patient adheres to MD follow-up  Patient is not taking her medications as prescribed due to states, "I don't like taking medicines and some of them are not good for me"  Plan:  . Patient will call the CCM team any questions or concerns that may arise before her next CCM f/u  The CCM team will follow-up with patient face to face following her AWV set for 08/08/18 @4 :00 PM.   Please see past updates related to this goal by clicking on the "Past Updates" button in the selected goal       PLAN:  The CCM team will follow-up with patient face to face following her AWV set for 08/08/18 @4 :[place], RN,CCM Care Management Coordinator [place] [facility]  Direct Phone: [phone]

## 2018-07-28 ENCOUNTER — Other Ambulatory Visit: Payer: Self-pay | Admitting: Critical Care Medicine

## 2018-07-30 ENCOUNTER — Encounter: Payer: Self-pay | Admitting: Nurse Practitioner

## 2018-07-31 ENCOUNTER — Telehealth: Payer: Self-pay

## 2018-07-31 ENCOUNTER — Other Ambulatory Visit: Payer: Self-pay | Admitting: Nurse Practitioner

## 2018-07-31 DIAGNOSIS — F209 Schizophrenia, unspecified: Secondary | ICD-10-CM

## 2018-08-02 ENCOUNTER — Telehealth: Payer: Self-pay

## 2018-08-05 DIAGNOSIS — G4733 Obstructive sleep apnea (adult) (pediatric): Secondary | ICD-10-CM | POA: Diagnosis not present

## 2018-08-05 DIAGNOSIS — J449 Chronic obstructive pulmonary disease, unspecified: Secondary | ICD-10-CM | POA: Diagnosis not present

## 2018-08-06 ENCOUNTER — Telehealth: Payer: Self-pay

## 2018-08-06 ENCOUNTER — Ambulatory Visit: Payer: Self-pay

## 2018-08-06 DIAGNOSIS — I469 Cardiac arrest, cause unspecified: Secondary | ICD-10-CM

## 2018-08-06 DIAGNOSIS — J449 Chronic obstructive pulmonary disease, unspecified: Secondary | ICD-10-CM

## 2018-08-06 DIAGNOSIS — F209 Schizophrenia, unspecified: Secondary | ICD-10-CM

## 2018-08-06 DIAGNOSIS — I509 Heart failure, unspecified: Secondary | ICD-10-CM

## 2018-08-06 NOTE — Chronic Care Management (AMB) (Signed)
  Chronic Care Management   Social Work Note  08/06/2018 Name: Kim [name] MRN: [id number] DOB: Jan 20, 1956  Kim [name] is a 63 y.o. year old female who sees [name], [facility] for primary care. The CCM team was consulted for assistance with [name].   I attempted to outreach [name] to communicate recent psychology referral as well as to discuss any updates to housing goal. Unfortunately, today's call was unsuccessful. The patients cell continues to be turned off. SW also attempted to outreach the [facility], where the patient is currently living. Unfortunately, this call was also unsuccessful.   Follow Up Plan: SW will follow up with patient by phone over the next week.  [name], BSW, CDP [facility] / [facility] Care Management Social Worker [phone]  Total time spent performing care coordination and/or care management activities with the patient by phone or face to face = 2 minutes.

## 2018-08-07 NOTE — Progress Notes (Deleted)
Cardiology Office Note:    Date:  08/07/2018   ID:  Kim [name], DOB [birth year], MRN [id number]  PCP:  [name], FNP  Cardiologist:  [name], MD   Referring MD: [name], FNP  ASSESSMENT:    No diagnosis found. PLAN:    In order of problems listed above:  1. ***  Next appointment   Medication Adjustments/Labs and Tests Ordered: Current medicines are reviewed at length with the patient today.  Concerns regarding medicines are outlined above.  No orders of the defined types were placed in this encounter.  No orders of the defined types were placed in this encounter.    No chief complaint on file. ***  History of Present Illness:    Kim [name] is a 63 y.o. female with a history of COPD hypertension diabetes hyperlipidemia deep vein thrombosis and schizophrenia.  She had an out of hospital arrest as described that she received CPR epinephrine and recovery of spontaneous circulation she was not defibrillated.  The discharge summary describes this as a respiratory initiated asystole.  At that time she had a h influenza multifocal pneumonia and respiratory failure with hypoxia and CO2 retention.  She was not seen by cardiology during that admission and her last evaluation by cardiology was 12/16/2014 when she saw Dr. Donnella Bi his chart says that she had normal coronary arteriography and normal left ventricular function and left heart catheterization 02/10/1999 he also describes her having a history of lower extremity deep vein thrombosis and had been anticoagulated with Xarelto. She is is being seen today for the evaluation of CAD at the request of [name], [place].  She had an echocardiogram performed 06/04/2018 during her recent hospitalization showing ejection fraction of 50 to 55% low normal to mildly reduced there was no regional wall motion abnormalities and suggest previous myocardial infarction.  Her right ventricle was described as moderately dilated  implying significant lung disease she had mild tricuspid regurgitation pulmonary artery pressure was not estimated.  Her EKG personally reviewed 06/04/2018 shows findings of right ventricular hypertrophy left and right atrial enlargement consistent with chronic lung disease.  Chest CTA and admission to the hospital showed dilation of the main pulmonary artery quite significant 47 mm indicating pulmonary artery hypertension and there is mild coronary artery and aortic atherosclerosis and calcification.. She had extensive pulmonary infiltrates multiple foci of pneumonia and no evidence of a pulmonary embolism Past Medical History:  Diagnosis Date  . CHF (congestive heart failure) ([place])   . COPD (chronic obstructive pulmonary disease) ([place])   . Diabetes mellitus without complication ([place])   . GERD (gastroesophageal reflux disease)   . History of DVT (deep vein thrombosis)   . Hypercholesteremia   . Hyperlipemia   . Normal coronary arteries    by cardiac catheterization which I performed September 2015  . Schizophrenia ([place])   . Tobacco abuse     Past Surgical History:  Procedure Laterality Date  . LEFT HEART CATHETERIZATION WITH CORONARY ANGIOGRAM N/A 02/09/2014   Procedure: LEFT HEART CATHETERIZATION WITH CORONARY ANGIOGRAM;  Surgeon: Lorretta Harp, MD;  Location: [facility] CATH LAB;  Service: Cardiovascular;  Laterality: N/A;  . none      Current Medications: No outpatient medications have been marked as taking for the 08/09/18 encounter (Appointment) with Richardo Priest, MD.     Allergies:   Asa [aspirin]; Metformin and related; Penicillins; Quetiapine; and Acetaminophen   Social History   Socioeconomic History  . Marital status: Single  Spouse name: Not on file  . Number of children: 0  . Years of education: Not on file  . Highest education level: GED or equivalent  Occupational History  . Not on file  Social Needs  . Financial resource strain: Very hard  . Food insecurity:     Worry: Often true    Inability: Often true  . Transportation needs:    Medical: No    Non-medical: No  Tobacco Use  . Smoking status: Current Some Day Smoker    Packs/day: 1.00    Years: 21.00    Pack years: 21.00    Types: Cigarettes  . Smokeless tobacco: Never Used  . Tobacco comment: smoking 1/2-1ppd   Substance and Sexual Activity  . Alcohol use: No  . Drug use: No  . Sexual activity: Yes    Birth control/protection: None  Lifestyle  . Physical activity:    Days per week: Not on file    Minutes per session: Not on file  . Stress: Not on file  Relationships  . Social connections:    Talks on phone: Not on file    Gets together: Not on file    Attends religious service: Not on file    Active member of club or organization: Not on file    Attends meetings of clubs or organizations: Not on file    Relationship status: Not on file  Other Topics Concern  . Not on file  Social History Narrative   Resident at [facility],   [address] [place], [place] [address]     Family History: The patient's ***family history is not on file.  ROS:   ROS Please see the history of present illness.    *** All other systems reviewed and are negative.  EKGs/Labs/Other Studies Reviewed:    The following studies were reviewed today: ***  EKG:  EKG is *** ordered today.  The ekg ordered today is personally reviewed and demonstrates ***  Recent Labs: 06/08/2018: ALT 20 06/11/2018: Magnesium 1.8 07/10/2018: BUN 7; Creatinine, Ser 0.58; Hemoglobin 14.8; Platelets 235; Potassium 4.3; Sodium 139  Recent Lipid Panel No results found for: CHOL, TRIG, HDL, CHOLHDL, VLDL, LDLCALC, LDLDIRECT  Physical Exam:    VS:  There were no vitals taken for this visit.    Wt Readings from Last 3 Encounters:  07/18/18 154 lb 12.8 oz (70.2 kg)  07/10/18 157 lb 3.2 oz (71.3 kg)  06/12/18 151 lb 12.7 oz (68.9 kg)     GEN: *** Well nourished, well developed in no acute distress HEENT: Normal  NECK: No JVD; No carotid bruits LYMPHATICS: No lymphadenopathy CARDIAC: ***RRR, no murmurs, rubs, gallops RESPIRATORY:  Clear to auscultation without rales, wheezing or rhonchi  ABDOMEN: Soft, non-tender, non-distended MUSCULOSKELETAL:  No edema; No deformity  SKIN: Warm and dry NEUROLOGIC:  Alert and oriented x 3 PSYCHIATRIC:  Normal affect     Signed, [name], MD  08/07/2018 12:28 PM    [facility]

## 2018-08-08 ENCOUNTER — Other Ambulatory Visit: Payer: Self-pay

## 2018-08-08 ENCOUNTER — Ambulatory Visit (INDEPENDENT_AMBULATORY_CARE_PROVIDER_SITE_OTHER): Payer: Medicare HMO | Admitting: Internal Medicine

## 2018-08-08 ENCOUNTER — Telehealth: Payer: Self-pay

## 2018-08-08 ENCOUNTER — Encounter: Payer: Self-pay | Admitting: Internal Medicine

## 2018-08-08 ENCOUNTER — Ambulatory Visit: Payer: Self-pay

## 2018-08-08 ENCOUNTER — Ambulatory Visit (INDEPENDENT_AMBULATORY_CARE_PROVIDER_SITE_OTHER): Payer: Medicare HMO

## 2018-08-08 VITALS — BP 118/70 | HR 98 | Temp 98.4°F | Ht 63.6 in | Wt 150.0 lb

## 2018-08-08 DIAGNOSIS — J449 Chronic obstructive pulmonary disease, unspecified: Secondary | ICD-10-CM

## 2018-08-08 DIAGNOSIS — Z0000 Encounter for general adult medical examination without abnormal findings: Secondary | ICD-10-CM

## 2018-08-08 DIAGNOSIS — R7303 Prediabetes: Secondary | ICD-10-CM | POA: Diagnosis not present

## 2018-08-08 DIAGNOSIS — Z9289 Personal history of other medical treatment: Secondary | ICD-10-CM

## 2018-08-08 DIAGNOSIS — F209 Schizophrenia, unspecified: Secondary | ICD-10-CM

## 2018-08-08 DIAGNOSIS — Z79899 Other long term (current) drug therapy: Secondary | ICD-10-CM

## 2018-08-08 DIAGNOSIS — J029 Acute pharyngitis, unspecified: Secondary | ICD-10-CM

## 2018-08-08 DIAGNOSIS — I509 Heart failure, unspecified: Secondary | ICD-10-CM

## 2018-08-08 LAB — POCT URINALYSIS DIPSTICK
Bilirubin, UA: NEGATIVE
Blood, UA: NEGATIVE
Glucose, UA: NEGATIVE
Ketones, UA: NEGATIVE
LEUKOCYTES UA: NEGATIVE
Nitrite, UA: NEGATIVE
PH UA: 6 (ref 5.0–8.0)
Protein, UA: NEGATIVE
Spec Grav, UA: <=1.005 — AB (ref 1.010–1.025)
Urobilinogen, UA: 0.2 E.U./dL

## 2018-08-08 LAB — POCT UA - MICROALBUMIN
Albumin/Creatinine Ratio, Urine, POC: <30
Creatinine, POC: 100 mg/dL
Microalbumin Ur, POC: 10 mg/L

## 2018-08-08 LAB — POCT RAPID STREP A (OFFICE): Rapid Strep A Screen: NEGATIVE

## 2018-08-08 MED ORDER — NYSTATIN 100000 UNIT/ML MT SUSP: OROMUCOSAL | 0 refills | Status: DC

## 2018-08-08 NOTE — Chronic Care Management (AMB) (Signed)
  Chronic Care Management   Social Work Note  08/08/2018 Name: Kim [name] MRN: [id number] DOB: 12-09-1955  Kim [name] is a 63 y.o. year old female who sees [name], [place] for primary care. The CCM team was consulted for assistance with [name].   I attempted to outreach the patient on today's date to check in on patients goal progression. Unfortunately, the patient was unavailable at the time of my call. SW left message with [facility] requesting to have patient return call.  Follow Up Plan: SW will follow up with patient by phone over the next week.  [name], BSW, CDP [facility] / [facility] Care Management Social Worker [phone]  Total time spent performing care coordination and/or care management activities with the patient by phone or face to face = 5 minutes.

## 2018-08-08 NOTE — Patient Instructions (Signed)
START YOUR ADVAIR BACK AND USE IT EVERY DAY ONLY USE THE ALBUTEROL IF YOU HAVE EPISODES OF WHEEZING OR COUGH  I SENT A GARGLE FOR YOUR THROAT TO COVER POSSIBLE THRUSH SINCE THE INHALER CAN CAUSE THIS    Diabetes Mellitus and Nutrition, Adult When you have diabetes (diabetes mellitus), it is very important to have healthy eating habits because your blood sugar (glucose) levels are greatly affected by what you eat and drink. Eating healthy foods in the appropriate amounts, at about the same times every day, can help you:  Control your blood glucose.  Lower your risk of heart disease.  Improve your blood pressure.  Reach or maintain a healthy weight. Every person with diabetes is different, and each person has different needs for a meal plan. Your health care provider may recommend that you work with a diet and nutrition specialist (dietitian) to make a meal plan that is best for you. Your meal plan may vary depending on factors such as:  The calories you need.  The medicines you take.  Your weight.  Your blood glucose, blood pressure, and cholesterol levels.  Your activity level.  Other health conditions you have, such as heart or kidney disease. How do carbohydrates affect me? Carbohydrates, also called carbs, affect your blood glucose level more than any other type of food. Eating carbs naturally raises the amount of glucose in your blood. Carb counting is a method for keeping track of how many carbs you eat. Counting carbs is important to keep your blood glucose at a healthy level, especially if you use insulin or take certain oral diabetes medicines. It is important to know how many carbs you can safely have in each meal. This is different for every person. Your dietitian can help you calculate how many carbs you should have at each meal and for each snack. Foods that contain carbs include:  Bread, cereal, rice, pasta, and crackers.  Potatoes and corn.  Peas, beans, and  lentils.  Milk and yogurt.  Fruit and juice.  Desserts, such as cakes, cookies, ice cream, and candy. How does alcohol affect me? Alcohol can cause a sudden decrease in blood glucose (hypoglycemia), especially if you use insulin or take certain oral diabetes medicines. Hypoglycemia can be a life-threatening condition. Symptoms of hypoglycemia (sleepiness, dizziness, and confusion) are similar to symptoms of having too much alcohol. If your health care provider says that alcohol is safe for you, follow these guidelines:  Limit alcohol intake to no more than 1 drink per day for nonpregnant women and 2 drinks per day for men. One drink equals 12 oz of beer, 5 oz of wine, or 1 oz of hard liquor.  Do not drink on an empty stomach.  Keep yourself hydrated with water, diet soda, or unsweetened iced tea.  Keep in mind that regular soda, juice, and other mixers may contain a lot of sugar and must be counted as carbs. What are tips for following this plan?  Reading food labels  Start by checking the serving size on the "Nutrition Facts" label of packaged foods and drinks. The amount of calories, carbs, fats, and other nutrients listed on the label is based on one serving of the item. Many items contain more than one serving per package.  Check the total grams (g) of carbs in one serving. You can calculate the number of servings of carbs in one serving by dividing the total carbs by 15. For example, if a food has 30 g of  total carbs, it would be equal to 2 servings of carbs.  Check the number of grams (g) of saturated and trans fats in one serving. Choose foods that have low or no amount of these fats.  Check the number of milligrams (mg) of salt (sodium) in one serving. Most people should limit total sodium intake to less than 2,300 mg per day.  Always check the nutrition information of foods labeled as "low-fat" or "nonfat". These foods may be higher in added sugar or refined carbs and should  be avoided.  Talk to your dietitian to identify your daily goals for nutrients listed on the label. Shopping  Avoid buying canned, premade, or processed foods. These foods tend to be high in fat, sodium, and added sugar.  Shop around the outside edge of the grocery store. This includes fresh fruits and vegetables, bulk grains, fresh meats, and fresh dairy. Cooking  Use low-heat cooking methods, such as baking, instead of high-heat cooking methods like deep frying.  Cook using healthy oils, such as olive, canola, or sunflower oil.  Avoid cooking with butter, cream, or high-fat meats. Meal planning  Eat meals and snacks regularly, preferably at the same times every day. Avoid going long periods of time without eating.  Eat foods high in fiber, such as fresh fruits, vegetables, beans, and whole grains. Talk to your dietitian about how many servings of carbs you can eat at each meal.  Eat 4-6 ounces (oz) of lean protein each day, such as lean meat, chicken, fish, eggs, or tofu. One oz of lean protein is equal to: ? 1 oz of meat, chicken, or fish. ? 1 egg. ?  cup of tofu.  Eat some foods each day that contain healthy fats, such as avocado, nuts, seeds, and fish. Lifestyle  Check your blood glucose regularly.  Exercise regularly as told by your health care provider. This may include: ? 150 minutes of moderate-intensity or vigorous-intensity exercise each week. This could be brisk walking, biking, or water aerobics. ? Stretching and doing strength exercises, such as yoga or weightlifting, at least 2 times a week.  Take medicines as told by your health care provider.  Do not use any products that contain nicotine or tobacco, such as cigarettes and e-cigarettes. If you need help quitting, ask your health care provider.  Work with a Social worker or diabetes educator to identify strategies to manage stress and any emotional and social challenges. Questions to ask a health care provider   Do I need to meet with a diabetes educator?  Do I need to meet with a dietitian?  What number can I call if I have questions?  When are the best times to check my blood glucose? Where to find more information:  American Diabetes Association: diabetes.org  Academy of Nutrition and Dietetics: www.eatright.[employer] of Diabetes and Digestive and Kidney Diseases (NIH): [employer] Summary  A healthy meal plan will help you control your blood glucose and maintain a healthy lifestyle.  Working with a diet and nutrition specialist (dietitian) can help you make a meal plan that is best for you.  Keep in mind that carbohydrates (carbs) and alcohol have immediate effects on your blood glucose levels. It is important to count carbs and to use alcohol carefully. This information is not intended to replace advice given to you by your health care provider. Make sure you discuss any questions you have with your health care provider. Document Released: 02/02/2005 Document Revised: 12/06/2016 Document Reviewed: 06/12/2016 Elsevier  Interactive Patient Education  2019 [place], Adult  Oral thrush is an infection in your mouth and throat. It causes white patches on your tongue and in your mouth. Follow these instructions at home: Helping with soreness   To lessen your pain: ? Drink cold liquids, like water and iced tea. ? Eat frozen ice pops or frozen juices. ? Eat foods that are easy to swallow, like gelatin and ice cream. ? Drink from a straw if the patches in your mouth are painful. General instructions  Take or use over-the-counter and prescription medicines only as told by your doctor. Medicine for oral thrush may be something to swallow, or it may be something to put on the infected area.  Eat plain yogurt that has live cultures in it. Read the label to make sure.  If you wear dentures: ? Take out your dentures before you go to bed. ? Brush them  well. ? Soak them in a denture cleaner.  Rinse your mouth with warm salt-water many times a day. To make the salt-water mixture, completely dissolve 1/2-1 teaspoon of salt in 1 cup of warm water. Contact a doctor if:  Your problems are getting worse.  Your problems do not get better in less than 7 days with treatment.  Your infection is spreading. This may show as white patches on the skin outside of your mouth.  You are nursing your baby and you have redness and pain in the nipples. This information is not intended to replace advice given to you by your health care provider. Make sure you discuss any questions you have with your health care provider. Document Released: 08/02/2009 Document Revised: 01/31/2016 Document Reviewed: 01/31/2016 Elsevier Interactive Patient Education  [employer].

## 2018-08-08 NOTE — Progress Notes (Signed)
Subjective:   Kim [name] is a 63 y.o. female who presents for Medicare Annual (Subsequent) preventive examination.  Review of Systems:  n/a Cardiac Risk Factors include: diabetes mellitus;dyslipidemia;hypertension;smoking/ tobacco exposure     Objective:     Vitals: BP 118/70 (BP Location: Left Arm, Patient Position: Sitting)   Pulse 98   Temp 98.4 F (36.9 C) (Oral)   Ht 5' 3.6" (1.615 m)   Wt 150 lb (68 kg)   SpO2 92%   BMI 26.07 kg/m   Body mass index is 26.07 kg/m.  Advanced Directives 08/08/2018 07/18/2018 06/07/2018 04/03/2018 01/26/2017 09/18/2016 06/24/2016  Does Patient Have a Medical Advance Directive? No No No No No No No  Would patient like information on creating a medical advance directive? Yes (MAU/Ambulatory/Procedural Areas - Information given) No - Patient declined No - Patient declined Yes (ED - Information included in AVS) - - -    Tobacco Social History   Tobacco Use  Smoking Status Current Some Day Smoker  . Packs/day: 1.00  . Years: 21.00  . Pack years: 21.00  . Types: Cigarettes  Smokeless Tobacco Never Used  Tobacco Comment   smoking 1/2-1ppd      Ready to quit: Not Answered Counseling given: Not Answered Comment: smoking 1/2-1ppd    Clinical Intake:  Pre-visit preparation completed: Yes  Pain : No/denies pain Pain Score: 0-No pain     Nutritional Status: BMI 25 -29 Overweight Nutritional Risks: None Diabetes: Yes CBG done?: No Did pt. bring in CBG monitor from home?: No  How often do you need to have someone help you when you read instructions, pamphlets, or other written materials from your doctor or pharmacy?: 1 - Never What is the last grade level you completed in school?: GED  Interpreter Needed?: No  Information entered by :: NAllen LPN  Past Medical History:  Diagnosis Date  . CHF (congestive heart failure) ([place])   . COPD (chronic obstructive pulmonary disease) ([place])   . Diabetes mellitus without complication ([place])    . GERD (gastroesophageal reflux disease)   . History of DVT (deep vein thrombosis)   . Hypercholesteremia   . Hyperlipemia   . Normal coronary arteries    by cardiac catheterization which I performed September 2015  . Schizophrenia ([place])   . Tobacco abuse    Past Surgical History:  Procedure Laterality Date  . LEFT HEART CATHETERIZATION WITH CORONARY ANGIOGRAM N/A 02/09/2014   Procedure: LEFT HEART CATHETERIZATION WITH CORONARY ANGIOGRAM;  Surgeon: Lorretta Harp, MD;  [facility] CATH LAB;  Service: Cardiovascular;  Laterality: N/A;  . none     History reviewed. No pertinent family history. Social History   Socioeconomic History  . Marital status: Single    Spouse name: Not on file  . Number of children: 0  . Years of education: Not on file  . Highest education level: GED or equivalent  Occupational History  . Occupation: unemployed  Social Needs  . Financial resource strain: Very hard  . Food insecurity:    Worry: Often true    Inability: Often true  . Transportation needs:    Medical: No    Non-medical: No  Tobacco Use  . Smoking status: Current Some Day Smoker    Packs/day: 1.00    Years: 21.00    Pack years: 21.00    Types: Cigarettes  . Smokeless tobacco: Never Used  . Tobacco comment: smoking 1/2-1ppd   Substance and Sexual Activity  . Alcohol use: No  .  Drug use: No  . Sexual activity: Not Currently    Birth control/protection: None  Lifestyle  . Physical activity:    Days per week: 3 days    Minutes per session: 20 min  . Stress: Not at all  Relationships  . Social connections:    Talks on phone: Not on file    Gets together: Not on file    Attends religious service: Not on file    Active member of club or organization: Not on file    Attends meetings of clubs or organizations: Not on file    Relationship status: Not on file  Other Topics Concern  . Not on file  Social History Narrative   Resident at [facility],   [place], [place] [id number]    Outpatient Encounter Medications as of 08/08/2018  Medication Sig  . albuterol (PROVENTIL HFA;VENTOLIN HFA) 108 (90 Base) MCG/ACT inhaler Inhale 2 puffs into the lungs every 6 (six) hours as needed for wheezing or shortness of breath.  . benzonatate (TESSALON PERLES) 100 MG capsule Take 1 capsule (100 mg total) by mouth every 6 (six) hours as needed for cough. (Patient not taking: Reported on 07/18/2018)  . cholecalciferol (VITAMIN D) 1000 units tablet Take 1,000 Units by mouth daily.  . Fluticasone-Salmeterol (ADVAIR) 250-50 MCG/DOSE AEPB Inhale 1 puff into the lungs 2 (two) times daily. (Patient not taking: Reported on 07/18/2018)  . furosemide (LASIX) 20 MG tablet TAKE 1 OR 2 TABLETS BY MOUTH AS NEEDED (Patient not taking: Reported on 07/10/2018)  . naproxen (NAPROSYN) 375 MG tablet Take 1 tablet (375 mg total) by mouth 2 (two) times daily as needed for mild pain or moderate pain. (Patient not taking: Reported on 07/18/2018)   No facility-administered encounter medications on file as of 08/08/2018.     Activities of Daily Living In your present state of health, do you have any difficulty performing the following activities: 08/08/2018 07/18/2018  Hearing? Y N  Comment left ear cyst -  Vision? Y Y  Comment blurry at times -  Difficulty concentrating or making decisions? N Y  Walking or climbing stairs? Y N  Comment sometimes -  Dressing or bathing? N N  Doing errands, shopping? N N  Preparing Food and eating ? N N  Using the Toilet? N N  In the past six months, have you accidently leaked urine? Y N  Do you have problems with loss of bowel control? N N  Managing your Medications? N Y  Managing your Finances? N Y  Housekeeping or managing your Housekeeping? N N  Some recent data might be hidden    Patient Care Team: [name], FNP as PCP - General ([place]) [name] as Social Worker [place], Claudette Stapler, RN as Case Manager    Assessment:   This  is a routine wellness examination for Kim [name].  Exercise Activities and Dietary recommendations Current Exercise Habits: Home exercise routine, Type of exercise: walking;calisthenics, Time (Minutes): 20, Frequency (Times/Week): 3, Weekly Exercise (Minutes/Week): 60, Intensity: Moderate  Goals    . "I have cough and congestion" "I will only use the Albuterol inhaler" (pt-stated)     Current Barriers:  . Non-adherence to taking medications as presribed   Nurse Case Manager Clinical Goal(s):   Over the next 30 days, patient will have no ED visits or IP admissions secondary to COPD exacerbation.  Interventions:   Telephone CCM follow-up with patient and SW [facility]  Assessed respiratory status, no  noted audible cough and congestion during call and patient states symptoms have resolved  Assessed for adherence to refilling/using Albuterol inhaler (pt states she has not picked it up from the pharmacy but will do so)  Reinforced the importance of patient filling Rx for Albuterol to have on hand in case needed for rescue inhaler  Answered patient questions related to Coronavirus  Provided patient with CDC recommendations to help prevent contracting the virus, including using good handwashing with antibacterial soap and friction for 20 seconds or longer, washing hands frequently, avoid putting hands around mouth or nose, avoid large crowds of people if possible, avoid being in the line of spray of someone coughing/sneezing, using an alcohol based hand sanitizer in addition to handwashing, reporting any symptoms of cough/congestion, fever, general malaise as soon as symptoms begin  Reassured patient according to WHO (world health organization, [place] is at low risk for having a Coronavirus epidemic  Confirmed patient has contact numbers for the CCM team and or provider  Encouraged patient to call if needed  Scheduled CCM follow up with patient following her Cardiology appt    Patient Self Care Activities:   Patient verbalizes understanding of today's instructions/education provided  Patient is able to drive her self to her MD appointments or utilize the bus system  Patient adheres to MD follow-up appointments  Patient is not taking her medications as prescribed due to states, "I don't like taking medicines and some of them are not good for me"  Plan:   The CCM team will follow-up with patient face to face following her AWV set for 08/08/18 @4 :00 PM.   Telephone CCM follow-up scheduled with patient following her Cardiology follow-up set for 08/09/18  Patient will keep all MD appointment scheduled including her new patient Cardiology appt . Patient will call the CCM team any questions or concerns that may arise before her next CCM f/u  Please see past updates related to this goal by clicking on the "Past Updates" button in the selected goal       . "I need a job" (pt-stated)     Current Barriers:  . Concerns surrounding physical abilities after recent cardiac episode  Clinical Social Work Clinical Goal(s):  [name] Over the next 30 days, client will work with SW to address concerns related to job placement  . Over the next 20 days patient will follow up with recent cardiology referral  Interventions: . Discussed plans with patient for ongoing care management follow up and provided patient with direct contact information for care management team . Advised patient to contact SW once cardiology visit is completed  Patient Self Care Activities:  . Performs ADL's independently . Calls provider office for new concerns or questions   Plan:  . Patient will attend cardiology appointment and discuss concerns of physical ability to work after recent cardiac arrest  Please see past updates related to this goal by clicking on the "Past Updates" button in the selected goal      . "I need a referral to a Cardiologist" (pt-stated)     Current Barriers:  [name] Knowledge  Deficit related to post discharge follow-up with Cardiology (referral needed) . 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   Interventions:   Telephone CCM follow-up completed with patient and [place]  patient of her new patient Cardiology appointment scheduled for 08/09/18 @3 :00 PM with Dr. [name]  Confirmed patient has the contact # for CCM team member's and provider's office   Telephone CCM follow-up scheduled with patient following her Cardiology    Patient Self Care Activities:   Patient verbalizes understanding of today's instructions/education provided  Patient is able to drive her self to her MD appointments or utilize the bus system  Patient adheres to MD follow-up  Patient is not taking her medications as prescribed due to states, "I don't like taking medicines and some of them are not good for me"  Plan:  . Patient will call the CCM team any questions or concerns that may arise before her next CCM f/u . Telephone CCM follow-up set for    Please see past updates related to this goal by clicking on the "Past Updates" button in the selected goal       . "I need housing" (pt-stated)     Current Barriers:  . Housing barriers . Financial constraints  Clinical Social Work Clinical Goal(s):  [name] Over the next 30 days, client will work with SW to address concerns related to housing  Interventions: . Patient interviewed and appropriate assessments performed  . Patient reports she is still at the homeless shelter and awaiting a move in date to transitional housing. Patient reports this could take anywhere from 30 - 90 days.  Patient Self Care Activities:  . Calls provider office for new concerns or questions . Performs ADL's independently  . Patient is  actively working with case manager at the homeless shelter to seek housing  Plan:  . Social Worker will follow up with the patient in the next 60 days to assess housing status.  Please see past updates related to this goal by clicking on the "Past Updates" button in the selected goal           . "I want to see a psychologist and not a psychiatrist" (pt-stated)     Current Barriers:  [name] Mental Health Concerns   . Patient is unwilling to see a psychiatrist over concerns of taking prescribed medications  Clinical Social Work Clinical Goal(s):  [name] Over the next 30 days, patient will work with referred psychologist to address needs related to mental health counseling  Interventions: . Discussed plans with patient for ongoing care management follow up and provided patient with direct contact information for care management team . Collaborated with primary care provider re: patients desire to receive a psychology referral.  . Assisted patient/caregiver with obtaining information about health plan benefits  Patient Self Care Activities:  . Self administers medications as prescribed . Attends all scheduled provider appointments . Performs ADL's independently . Performs IADL's independently  Plan:  . Social Worker will follow up with the patient in the next week to confirm referral to psychologist has been completed  Initial goal documentation     . "I want to use the [facility] pharmacy" (pt-stated)     Current Barriers:  . Limited education about [facility] pharmacy enrollment . Limited social support  Clinical Social Work Clinical Goal(s):  [name] Over the next 15 days, client will follow up with [facility] mail order pharmacy as directed by SW  Interventions: . Patient interviewed and appropriate assessments performed . Provided patient with information about [facility] mail order pharmacy  Patient Self Care Activities:  . Performs ADL's independently . Calls provider office for new concerns  or questions  Plan:  .  Patient will speak with case manager at the shelter regarding ability to have medications delivered . Patient will contact [facility] mail order pharmacy to provide an address for medications to be delivered . Social Worker willfollow up with the patient in the next two weeks regarding patient ability to contact [facility] order pharmacy  *initial goal documentation        Fall Risk Fall Risk  08/08/2018 07/18/2018 03/13/2018  Falls in the past year? 1 0 No  Number falls in past yr: 0 - -  Comment had a heart attack - -  Injury with Fall? 0 - -  Risk for fall due to : History of fall(s);Medication side effect - -  Follow up Education provided;Falls prevention discussed - -   Is the patient's home free of loose throw rugs in walkways, pet beds, electrical cords, etc?   yes      Grab bars in the bathroom? yes      Handrails on the stairs?   yes      Adequate lighting?   yes  Timed Get Up and Go performed: n/a  Depression Screen PHQ 2/9 Scores 08/08/2018 07/18/2018 03/13/2018  PHQ - 2 Score 1 1 0  PHQ- 9 Score 2 - -     Cognitive Function     6CIT Screen 08/08/2018  What Year? 0 points  What month? 0 points  What time? 0 points  Count back from 20 0 points  Months in reverse 0 points  Repeat phrase 0 points  Total Score 0    Immunization History  Administered Date(s) Administered  . Influenza,inj,Quad PF,6+ Mos 02/05/2014, 06/12/2018  . Pneumococcal Polysaccharide-23 02/05/2014    Qualifies for Shingles Vaccine? no  Screening Tests Health Maintenance  Topic Date Due  . PAP SMEAR-Modifier  02/20/1976  . MAMMOGRAM  08/08/2019 (Originally 09/09/2008)  . COLONOSCOPY  09/01/2018  . TETANUS/TDAP  06/17/2023  . INFLUENZA VACCINE  Completed  . Hepatitis C Screening  Completed  . HIV Screening  Completed    Cancer Screenings: Lung: Low Dose CT Chest recommended if Age 63-80 years, 30 pack-year currently smoking OR have quit w/in 15years. Patient  does qualify. Breast:  Up to date on Mammogram? Yes   Up to date of Bone Density/Dexa? No Colorectal: up to date  Additional Screenings: : Hepatitis C Screening: 05/26/2015<0.1    Plan:    Recently had low dose CT scan. Declines mammograms   I have personally reviewed and noted the following in the patient's chart:   . Medical and social history . Use of alcohol, tobacco or illicit drugs  . Current medications and supplements . Functional ability and status . Nutritional status . Physical activity . Advanced directives . List of other physicians . Hospitalizations, surgeries, and ER visits in previous 12 months . Vitals . Screenings to include cognitive, depression, and falls . Referrals and appointments  In addition, I have reviewed and discussed with patient certain preventive protocols, quality metrics, and best practice recommendations. A written personalized care plan for preventive services as well as general preventive health recommendations were provided to patient.     Kellie Simmering, LPN  [id number]

## 2018-08-08 NOTE — Progress Notes (Signed)
Subjective:     Patient ID: Kim [name] , female    DOB: 05-01-1956 , 63 y.o.   MRN: [id number]   CC " FU prediabetes" and has a sore throat"  HPI  1-Pt is here for FU prediabetes. States she does not eat much sweets. She also wants her thyroid checked since one of her psych meds she used to take caused some thyroid levels to be abnormal.  2- Her throat has been feeling little scratchy x 2 days. She stopped using her Advair inhaler but been using her Proventil inhaler.        Past Medical History:  Diagnosis Date  . CHF (congestive heart failure) ([place])   . COPD (chronic obstructive pulmonary disease) ([place])   . Diabetes mellitus without complication ([place])   . GERD (gastroesophageal reflux disease)   . History of DVT (deep vein thrombosis)   . Hypercholesteremia   . Hyperlipemia   . Normal coronary arteries    by cardiac catheterization which I performed September 2015  . Schizophrenia ([place])   . Tobacco abuse      Family History  Problem Relation Age of Onset  . CAD Father   . Schizophrenia Brother      Current Outpatient Medications:  .  albuterol (PROVENTIL HFA;VENTOLIN HFA) 108 (90 Base) MCG/ACT inhaler, Inhale 2 puffs into the lungs every 6 (six) hours as needed for wheezing or shortness of breath., Disp: 1 Inhaler, Rfl: 2 .  cholecalciferol (VITAMIN D) 1000 units tablet, Take 1,000 Units by mouth daily., Disp: , Rfl:  .  Fluticasone-Salmeterol (ADVAIR) 250-50 MCG/DOSE AEPB, Inhale 1 puff into the lungs 2 (two) times daily. (Patient not taking: Reported on 07/18/2018), Disp: 60 each, Rfl: 5 .  nystatin (MYCOSTATIN) 100000 UNIT/ML suspension, 5 ml qid to gargle and spit x 7 days, Disp: 473 mL, Rfl: 0   Allergies  Allergen Reactions  . Asa [Aspirin] Other (See Comments)    Stomach pain  . Metformin And Related Other (See Comments)    STOMACH PROBLEMS  . Penicillins Nausea And Vomiting    Large doses  . Quetiapine Other (See Comments)    REACTION: nausea/dizzy   . Acetaminophen Nausea And Vomiting and Rash     Review of Systems  Constitutional: Positive for diaphoresis. Negative for chills, fatigue and fever.       Was sweating today because she felt hot.   HENT: Positive for sneezing and sore throat. Negative for congestion, ear discharge, ear pain, postnasal drip, rhinorrhea, trouble swallowing and voice change.   Eyes: Negative for discharge.  Respiratory: Positive for wheezing. Negative for cough, chest tightness and shortness of breath.   Cardiovascular: Positive for chest pain. Negative for palpitations and leg swelling.       Chest pressure if she stands 2 hours or longer  Gastrointestinal: Negative for nausea and vomiting.  Endocrine: Negative for polydipsia and polyphagia.  Genitourinary: Negative for dysuria.  Musculoskeletal: Negative for arthralgias, gait problem and myalgias.  Skin: Negative for rash.  Neurological: Negative for dizziness and headaches.  Hematological: Negative for adenopathy.    Today's Vitals   08/08/18 1617  BP: 118/70  Pulse: 98  Temp: 98.4 F (36.9 C)  TempSrc: Oral  SpO2: 98%  Weight: 150 lb (68 kg)  Height: 5' 3.6" (1.615 m)   Body mass index is 26.07 kg/m.   Objective:  Physical Exam  Constitutional: She is oriented to person, place, and time. She appears well-developed and well-nourished. No distress.  HENT:  Pharynx- erythematous with no exudate.  Head: Normocephalic and atraumatic.  Right Ear: External ear normal.  Left Ear: External ear normal.  Nose: Nose normal.  Eyes: Conjunctivae are normal. Right eye exhibits no discharge. Left eye exhibits no discharge. No scleral icterus.  Neck: Neck supple. No thyromegaly present. No lymphadenopathy.  Cardiovascular: Normal rate and regular rhythm.  No murmur heard. Pulmonary/Chest: Effort normal and breath sounds normal. No respiratory distress.  Musculoskeletal: Normal range of motion. She exhibits no edema.  Lymphadenopathy: She has no  cervical adenopathy.  Neurological: She is alert and oriented to person, place, and time.  Skin: Skin is warm and dry. Capillary refill takes less than 2 seconds. No rash noted. She is not diaphoretic.  Psychiatric: She has a normal mood and affect. Her behavior is normal. Judgment and thought content normal.  Nursing note reviewed. Rapid strep neg Assessment And Plan:  1. Pharyngitis, unspecified etiology- could be viral vs oral thrush - nystatin (MYCOSTATIN) 100000 UNIT/ML suspension; 5 ml qid to gargle and spit x 7 days  Dispense: 473 mL; Refill: 0 - POCT rapid strep A- neg  3. Long-term use of high-risk medication- chronic - CMP14 + Anion Gap - CBC no Diff - TSH - T3, free - T4, Free  4. Prediabetes- chronic. - Hemoglobin A1c   Advised to get back on her Advair and rinse her mouth after each use. FU with Janece in 3-4 months for prediabetes.  Laryn Venning RODRIGUEZ-SOUTHWORTH, PA-C

## 2018-08-08 NOTE — Patient Instructions (Signed)
Ms. Kim [name] , Thank you for taking time to come for your Medicare Wellness Visit. I appreciate your ongoing commitment to your health goals. Please review the following plan we discussed and let me know if I can assist you in the future.   Screening recommendations/referrals: Colonoscopy: 08/2008 Mammogram: decline Bone Density: 09/2014 Recommended yearly ophthalmology/optometry visit for glaucoma screening and checkup Recommended yearly dental visit for hygiene and checkup  Vaccinations: Influenza vaccine: 05/2018 Pneumococcal vaccine: 01/2014 Tdap vaccine: 05/2013 Shingles vaccine: discussed    Advanced directives: Advance directive discussed with you today. I have provided a copy for you to complete at home and have notarized. Once this is complete please bring a copy in to our office so we can scan it into your chart.   Conditions/risks identified: Overweight, smoking  Next appointment: 10/09/2018 at 43  Preventive Care 40-64 Years, Female Preventive care refers to lifestyle choices and visits with your health care provider that can promote health and wellness. What does preventive care include?  A yearly physical exam. This is also called an annual well check.  Dental exams once or twice a year.  Routine eye exams. Ask your health care provider how often you should have your eyes checked.  Personal lifestyle choices, including:  Daily care of your teeth and gums.  Regular physical activity.  Eating a healthy diet.  Avoiding tobacco and drug use.  Limiting alcohol use.  Practicing safe sex.  Taking low-dose aspirin daily starting at age 47.  Taking vitamin and mineral supplements as recommended by your health care provider. What happens during an annual well check? The services and screenings done by your health care provider during your annual well check will depend on your age, overall health, lifestyle risk factors, and family history of disease. Counseling  Your  health care provider may ask you questions about your:  Alcohol use.  Tobacco use.  Drug use.  Emotional well-being.  Home and relationship well-being.  Sexual activity.  Eating habits.  Work and work Statistician.  Method of birth control.  Menstrual cycle.  Pregnancy history. Screening  You may have the following tests or measurements:  Height, weight, and BMI.  Blood pressure.  Lipid and cholesterol levels. These may be checked every 5 years, or more frequently if you are over 81 years old.  Skin check.  Lung cancer screening. You may have this screening every year starting at age 66 if you have a 30-pack-year history of smoking and currently smoke or have quit within the past 15 years.  Fecal occult blood test (FOBT) of the stool. You may have this test every year starting at age 49.  Flexible sigmoidoscopy or colonoscopy. You may have a sigmoidoscopy every 5 years or a colonoscopy every 10 years starting at age 73.  Hepatitis C blood test.  Hepatitis B blood test.  Sexually transmitted disease (STD) testing.  Diabetes screening. This is done by checking your blood sugar (glucose) after you have not eaten for a while (fasting). You may have this done every 1-3 years.  Mammogram. This may be done every 1-2 years. Talk to your health care provider about when you should start having regular mammograms. This may depend on whether you have a family history of breast cancer.  BRCA-related cancer screening. This may be done if you have a family history of breast, ovarian, tubal, or peritoneal cancers.  Pelvic exam and Pap test. This may be done every 3 years starting at age 84. Starting at age 6,  this may be done every 5 years if you have a Pap test in combination with an HPV test.  Bone density scan. This is done to screen for osteoporosis. You may have this scan if you are at high risk for osteoporosis. Discuss your test results, treatment options, and if  necessary, the need for more tests with your health care provider. Vaccines  Your health care provider may recommend certain vaccines, such as:  Influenza vaccine. This is recommended every year.  Tetanus, diphtheria, and acellular pertussis (Tdap, Td) vaccine. You may need a Td booster every 10 years.  Zoster vaccine. You may need this after age 71.  Pneumococcal 13-valent conjugate (PCV13) vaccine. You may need this if you have certain conditions and were not previously vaccinated.  Pneumococcal polysaccharide (PPSV23) vaccine. You may need one or two doses if you smoke cigarettes or if you have certain conditions. Talk to your health care provider about which screenings and vaccines you need and how often you need them. This information is not intended to replace advice given to you by your health care provider. Make sure you discuss any questions you have with your health care provider. Document Released: 06/04/2015 Document Revised: 01/26/2016 Document Reviewed: 03/09/2015 Elsevier Interactive Patient Education  2017 [place] Prevention in the Home Falls can cause injuries. They can happen to people of all ages. There are many things you can do to make your home safe and to help prevent falls. What can I do on the outside of my home?  Regularly fix the edges of walkways and driveways and fix any cracks.  Remove anything that might make you trip as you walk through a door, such as a raised step or threshold.  Trim any bushes or trees on the path to your home.  Use bright outdoor lighting.  Clear any walking paths of anything that might make someone trip, such as rocks or tools.  Regularly check to see if handrails are loose or broken. Make sure that both sides of any steps have handrails.  Any raised decks and porches should have guardrails on the edges.  Have any leaves, snow, or ice cleared regularly.  Use sand or salt on walking paths during winter.   Clean up any spills in your garage right away. This includes oil or grease spills. What can I do in the bathroom?  Use night lights.  Install grab bars by the toilet and in the tub and shower. Do not use towel bars as grab bars.  Use non-skid mats or decals in the tub or shower.  If you need to sit down in the shower, use a plastic, non-slip stool.  Keep the floor dry. Clean up any water that spills on the floor as soon as it happens.  Remove soap buildup in the tub or shower regularly.  Attach bath mats securely with double-sided non-slip rug tape.  Do not have throw rugs and other things on the floor that can make you trip. What can I do in the bedroom?  Use night lights.  Make sure that you have a light by your bed that is easy to reach.  Do not use any sheets or blankets that are too big for your bed. They should not hang down onto the floor.  Have a firm chair that has side arms. You can use this for support while you get dressed.  Do not have throw rugs and other things on the floor that can make  you trip. What can I do in the kitchen?  Clean up any spills right away.  Avoid walking on wet floors.  Keep items that you use a lot in easy-to-reach places.  If you need to reach something above you, use a strong step stool that has a grab bar.  Keep electrical cords out of the way.  Do not use floor polish or wax that makes floors slippery. If you must use wax, use non-skid floor wax.  Do not have throw rugs and other things on the floor that can make you trip. What can I do with my stairs?  Do not leave any items on the stairs.  Make sure that there are handrails on both sides of the stairs and use them. Fix handrails that are broken or loose. Make sure that handrails are as long as the stairways.  Check any carpeting to make sure that it is firmly attached to the stairs. Fix any carpet that is loose or worn.  Avoid having throw rugs at the top or bottom of the  stairs. If you do have throw rugs, attach them to the floor with carpet tape.  Make sure that you have a light switch at the top of the stairs and the bottom of the stairs. If you do not have them, ask someone to add them for you. What else can I do to help prevent falls?  Wear shoes that:  Do not have high heels.  Have rubber bottoms.  Are comfortable and fit you well.  Are closed at the toe. Do not wear sandals.  If you use a stepladder:  Make sure that it is fully opened. Do not climb a closed stepladder.  Make sure that both sides of the stepladder are locked into place.  Ask someone to hold it for you, if possible.  Clearly mark and make sure that you can see:  Any grab bars or handrails.  First and last steps.  Where the edge of each step is.  Use tools that help you move around (mobility aids) if they are needed. These include:  Canes.  Walkers.  Scooters.  Crutches.  Turn on the lights when you go into a dark area. Replace any light bulbs as soon as they burn out.  Set up your furniture so you have a clear path. Avoid moving your furniture around.  If any of your floors are uneven, fix them.  If there are any pets around you, be aware of where they are.  Review your medicines with your doctor. Some medicines can make you feel dizzy. This can increase your chance of falling. Ask your doctor what other things that you can do to help prevent falls. This information is not intended to replace advice given to you by your health care provider. Make sure you discuss any questions you have with your health care provider. Document Released: 03/04/2009 Document Revised: 10/14/2015 Document Reviewed: 06/12/2014 Elsevier Interactive Patient Education  2017 [place] with Quitting Smoking  Quitting smoking is a physical and mental challenge. You will face cravings, withdrawal symptoms, and temptation. Before quitting, work with your health care  provider to make a plan that can help you cope. Preparation can help you quit and keep you from giving in. How can I cope with cravings? Cravings usually last for 5-10 minutes. If you get through it, the craving will pass. Consider taking the following actions to help you cope with cravings: Keep your mouth busy: Sarina Ser  sugar-free gum. Suck on hard candies or a straw. Brush your teeth. Keep your hands and body busy: Immediately change to a different activity when you feel a craving. Squeeze or play with a ball. Do an activity or a hobby, like making bead jewelry, practicing needlepoint, or working with wood. Mix up your normal routine. Take a short exercise break. Go for a quick walk or run up and down stairs. Spend time in public places where smoking is not allowed. Focus on doing something kind or helpful for someone else. Call a friend or family member to talk during a craving. Join a support group. Call a quit line, such as 1-800-QUIT-NOW. Talk with your health care provider about medicines that might help you cope with cravings and make quitting easier for you. How can I deal with withdrawal symptoms? Your body may experience negative effects as it tries to get used to not having nicotine in the system. These effects are called withdrawal symptoms. They may include: Feeling hungrier than normal. Trouble concentrating. Irritability. Trouble sleeping. Feeling depressed. Restlessness and agitation. Craving a cigarette. To manage withdrawal symptoms: Avoid places, people, and activities that trigger your cravings. Remember why you want to quit. Get plenty of sleep. Avoid coffee and other caffeinated drinks. These may worsen some of your symptoms. How can I handle social situations? Social situations can be difficult when you are quitting smoking, especially in the first few weeks. To manage this, you can: Avoid parties, bars, and other social situations where people might be  smoking. Avoid alcohol. Leave right away if you have the urge to smoke. Explain to your family and friends that you are quitting smoking. Ask for understanding and support. Plan activities with friends or family where smoking is not an option. What are some ways I can cope with stress? Wanting to smoke may cause stress, and stress can make you want to smoke. Find ways to manage your stress. Relaxation techniques can help. For example: Breathe slowly and deeply, in through your nose and out through your mouth. Listen to soothing, relaxing music. Talk with a family member or friend about your stress. Light a candle. Soak in a bath or take a shower. Think about a peaceful place. What are some ways I can prevent weight gain? Be aware that many people gain weight after they quit smoking. However, not everyone does. To keep from gaining weight, have a plan in place before you quit and stick to the plan after you quit. Your plan should include: Having healthy snacks. When you have a craving, it may help to: Eat plain popcorn, crunchy carrots, celery, or other cut vegetables. Chew sugar-free gum. Changing how you eat: Eat small portion sizes at meals. Eat 4-6 small meals throughout the day instead of 1-2 large meals a day. Be mindful when you eat. Do not watch television or do other things that might distract you as you eat. Exercising regularly: Make time to exercise each day. If you do not have time for a long workout, do short bouts of exercise for 5-10 minutes several times a day. Do some form of strengthening exercise, like weight lifting, and some form of aerobic exercise, like running or swimming. Drinking plenty of water or other low-calorie or no-calorie drinks. Drink 6-8 glasses of water daily, or as much as instructed by your health care provider. Summary Quitting smoking is a physical and mental challenge. You will face cravings, withdrawal symptoms, and temptation to smoke again.  Preparation can help you  as you go through these challenges. You can cope with cravings by keeping your mouth busy (such as by chewing gum), keeping your body and hands busy, and making calls to family, friends, or a helpline for people who want to quit smoking. You can cope with withdrawal symptoms by avoiding places where people smoke, avoiding drinks with caffeine, and getting plenty of rest. Ask your health care provider about the different ways to prevent weight gain, avoid stress, and handle social situations. This information is not intended to replace advice given to you by your health care provider. Make sure you discuss any questions you have with your health care provider. Document Released: 05/05/2016 Document Revised: 05/05/2016 Document Reviewed: 05/05/2016 Elsevier Interactive Patient Education  2019 [employer].

## 2018-08-09 ENCOUNTER — Telehealth: Payer: Self-pay | Admitting: Cardiology

## 2018-08-09 ENCOUNTER — Ambulatory Visit: Payer: Medicare HMO | Admitting: Cardiology

## 2018-08-09 ENCOUNTER — Telehealth: Payer: Self-pay

## 2018-08-09 LAB — CBC
Hematocrit: 43.8 % (ref 34.0–46.6)
Hemoglobin: 15.1 g/dL (ref 11.1–15.9)
MCH: 31.7 pg (ref 26.6–33.0)
MCHC: 34.5 g/dL (ref 31.5–35.7)
MCV: 92 fL (ref 79–97)
Platelets: 345 10*3/uL (ref 150–450)
RBC: 4.77 x10E6/uL (ref 3.77–5.28)
RDW: 12.2 % (ref 11.7–15.4)
WBC: 12 10*3/uL — AB (ref 3.4–10.8)

## 2018-08-09 LAB — CMP14 + ANION GAP
ALT: 9 IU/L (ref 0–32)
AST: 12 IU/L (ref 0–40)
Albumin/Globulin Ratio: 1.3 (ref 1.2–2.2)
Albumin: 3.8 g/dL (ref 3.8–4.8)
Alkaline Phosphatase: 146 IU/L — ABNORMAL HIGH (ref 39–117)
Anion Gap: 15 mmol/L (ref 10.0–18.0)
BUN/Creatinine Ratio: 11 — ABNORMAL LOW (ref 12–28)
BUN: 8 mg/dL (ref 8–27)
Bilirubin Total: <0.2 mg/dL (ref 0.0–1.2)
CO2: 26 mmol/L (ref 20–29)
Calcium: 9.9 mg/dL (ref 8.7–10.3)
Chloride: 95 mmol/L — ABNORMAL LOW (ref 96–106)
Creatinine, Ser: 0.71 mg/dL (ref 0.57–1.00)
GFR calc Af Amer: 105 mL/min/{1.73_m2} (ref >59)
GFR calc non Af Amer: 91 mL/min/{1.73_m2} (ref >59)
Globulin, Total: 2.9 g/dL (ref 1.5–4.5)
Glucose: 101 mg/dL — ABNORMAL HIGH (ref 65–99)
Potassium: 5.1 mmol/L (ref 3.5–5.2)
Sodium: 136 mmol/L (ref 134–144)
Total Protein: 6.7 g/dL (ref 6.0–8.5)

## 2018-08-09 LAB — T4, FREE: Free T4: 1.37 ng/dL (ref 0.82–1.77)

## 2018-08-09 LAB — HEMOGLOBIN A1C
ESTIMATED AVERAGE GLUCOSE: 131 mg/dL
HEMOGLOBIN A1C: 6.2 % — AB (ref 4.8–5.6)

## 2018-08-09 LAB — T3, FREE: T3, Free: 3.2 pg/mL (ref 2.0–4.4)

## 2018-08-09 LAB — TSH: TSH: 1.83 u[IU]/mL (ref 0.450–4.500)

## 2018-08-09 NOTE — Telephone Encounter (Signed)
1st attempt to give lab results  

## 2018-08-09 NOTE — Telephone Encounter (Signed)
1 - 08/09/2018 °

## 2018-08-09 NOTE — Telephone Encounter (Signed)
-----   Message from Shelby Mattocks, [place] sent at 08/09/2018 10:05 AM EDT ----- Please inform pt that her diabetes test shows slight improvement. Was 6.5, now is 6.2, normal is 5.6

## 2018-08-11 ENCOUNTER — Encounter: Payer: Self-pay | Admitting: Nurse Practitioner

## 2018-08-13 ENCOUNTER — Telehealth: Payer: Self-pay

## 2018-08-13 ENCOUNTER — Ambulatory Visit: Payer: Self-pay

## 2018-08-13 NOTE — Chronic Care Management (AMB) (Signed)
  Chronic Care Management   Social Work Note  08/13/2018 Name: Kim [name] MRN: [id number] DOB: 1956-05-16  [name] is a 63 y.o. year old female who sees [name], [place] for primary care. The CCM team was consulted for assistance with [name].   I attempted to outreach the patient to follow up on goal progression. Unfortunately, the patient was unavailable when SW contacted the [name] with worker stating "she just left". SW left a message with contact information requesting the patient return my call.  Follow Up Plan: SW will follow up with patient by phone over the next week.  [name], BSW, CDP [facility] / [facility] Care Management Social Worker [phone]  Total time spent performing care coordination and/or care management activities with the patient by phone or face to face = 3 minutes.

## 2018-08-15 ENCOUNTER — Telehealth: Payer: Self-pay

## 2018-08-16 ENCOUNTER — Telehealth: Payer: Self-pay

## 2018-08-20 ENCOUNTER — Telehealth: Payer: Self-pay

## 2018-08-20 ENCOUNTER — Ambulatory Visit: Payer: Self-pay

## 2018-08-20 DIAGNOSIS — J449 Chronic obstructive pulmonary disease, unspecified: Secondary | ICD-10-CM

## 2018-08-20 DIAGNOSIS — I509 Heart failure, unspecified: Secondary | ICD-10-CM

## 2018-08-20 DIAGNOSIS — F209 Schizophrenia, unspecified: Secondary | ICD-10-CM

## 2018-08-20 NOTE — Chronic Care Management (AMB) (Signed)
Chronic Care Management   Outreach Note  08/20/2018 Name: Kim [name] MRN: [id number] DOB: Nov 05, 1955  Referred by: [name], FNP Reason for referral : [place] unsuccessful telephone outreach was attempted today.    Kim [name] is a 63 y.o. year old female who is a primary care patient of [name], [facility]. The CCM team was consulted for assistance with chronic disease management and care coordination needs.    Goals Addressed            This Visit's Progress     Patient Stated   . COMPLETED: "I need a job" (pt-stated) This is an unmet goal       Current Barriers:  . Concerns surrounding physical abilities after recent cardiac episode  Clinical Social Work Clinical Goal(s):  [name] Over the next 30 days, client will work with SW to address concerns related to job placement  . Over the next 20 days patient will follow up with recent cardiology referral  Interventions: . SW placed an unsuccessful call to the patient in effort to follow up on goal progression  Patient Self Care Activities:  . Performs ADL's independently . Calls provider office for new concerns or questions    Please see past updates related to this goal by clicking on the "Past Updates" button in the selected goal     . COMPLETED: "I need housing" (pt-stated) This is an unmet goal       Current Barriers:  . Housing barriers . Financial constraints  Clinical Social Work Clinical Goal(s):  [name] Over the next 30 days, client will work with SW to address concerns related to housing  Interventions:  SW placed an unsuccessful outreach to the patient to follow up on goal progression  Patient Self Care Activities:  . Calls provider office for new concerns or questions . Performs ADL's independently  . Patient is actively working with case manager at the homeless shelter to seek housing   Please see past updates related to this goal by clicking on the "Past Updates" button in  the selected goal     . COMPLETED: "I want to see a psychologist and not a psychiatrist" (pt-stated) SW unable to confirm patient follow up with resource due to an inability to engage to the patient.        Current Barriers:  [name] Mental Health Concerns   . Patient is unwilling to see a psychiatrist over concerns of taking prescribed medications  Clinical Social Work Clinical Goal(s):  [name] Over the next 30 days, patient will work with referred psychologist to address needs related to mental health counseling  Interventions: . Received communication from patients provider [name] confirming referral to psychology had been placed . SW placed unsuccessful attempt to the patient to confirm contact by psychologist  Patient Self Care Activities:  . Self administers medications as prescribed . Attends all scheduled provider appointments . Performs ADL's independently . Performs IADL's independently   Please see past updates related to this goal by clicking on the "Past Updates" button in the selected goal         Follow Up Plan: No further follow up required: Due to four unsuccessful outreach attemps with no return call by the patient, SW will close patient to [place] will remain available if the patient chooses to engage at a later date.   [name], BSW, CDP [facility] / [facility] Care Management Social Worker [phone]  Total time spent performing  care coordination and/or care management activities with the patient by phone or face to face = 15 minutes.

## 2018-08-21 ENCOUNTER — Ambulatory Visit: Payer: Self-pay

## 2018-08-21 ENCOUNTER — Telehealth: Payer: Self-pay

## 2018-08-21 DIAGNOSIS — J449 Chronic obstructive pulmonary disease, unspecified: Secondary | ICD-10-CM

## 2018-08-21 DIAGNOSIS — I509 Heart failure, unspecified: Secondary | ICD-10-CM

## 2018-08-21 DIAGNOSIS — F209 Schizophrenia, unspecified: Secondary | ICD-10-CM

## 2018-08-21 NOTE — Chronic Care Management (AMB) (Signed)
  Chronic Care Management   Social Work Note  08/21/2018 Name: Kim [name] MRN: [id number] DOB: 1956/02/27  Kim [name] is a 63 y.o. year old female who sees [name], [place] for primary care. The CCM team was consulted for assistance with chronic disease management.   I received a voice message from the patient requesting "a new list for cardiologists". I placed a collaboration call to Barb Merino, CCM nurse case manager. Mrs. Rex Kras will plan to follow up with the patient regarding patient request. Mrs. Rex Kras is aware this Probation officer closed the patient to SW due to an inability to maintain contact. Mrs. Rex Kras will alert this writer if the patient is interested in continued engagement with SW.  Follow Up Plan: The patient will be outreached by CCM nurse case manager.  [name], BSW, CDP [facility] / [facility] Care Management Social Worker [phone]  Total time spent performing care coordination and/or care management activities with the patient by phone or face to face = 10 minutes.

## 2018-08-21 NOTE — Chronic Care Management (AMB) (Signed)
   Chronic Care Management   Outreach Note  08/21/2018 Name: Kim [name] MRN: [id number] DOB: 1955-07-11  Referred by: [name], FNP Reason for referral : Chronic Care Management (TELEPHONE CCM FOLLOW UP)   An unsuccessful telephone outreach was attempted today. The patient was referred to the case management team by [name] FNP for assistance with disease management of COPD, [name] care coordination.   Follow Up Plan: The CM team will reach out to the patient again over the next 5-7  days.    Barb Merino, RN,CCM Care Management Coordinator [place] Management/[facility]  Direct Phone: [phone]

## 2018-08-22 ENCOUNTER — Telehealth: Payer: Self-pay

## 2018-08-22 ENCOUNTER — Ambulatory Visit: Payer: Self-pay

## 2018-08-22 DIAGNOSIS — J449 Chronic obstructive pulmonary disease, unspecified: Secondary | ICD-10-CM

## 2018-08-22 DIAGNOSIS — I509 Heart failure, unspecified: Secondary | ICD-10-CM

## 2018-08-22 NOTE — Chronic Care Management (AMB) (Signed)
  Chronic Care Management   Outreach Note  08/22/2018 Name: FRAIDA VELDMAN MRN: [id number] DOB: 08-Jan-1956  Referred by: [name], FNP Reason for referral : Chronic Care Management (TELEPHONE CCM FOLLOW UP)   Voice message received from Ms. Delangel today requesting a return call to her home #, the [employer].   Second unsuccessful telephone outreach was attempted today. The patient was referred to the case management team by [name], FNP for assistance with disease management of COPD, CHF and care coordination.   Follow Up Plan: Telephone follow up appointment with CCM team member scheduled for: 08/28/18   Barb Merino, [facility] Care Management Coordinator [place] [facility]  Direct Phone: [phone]

## 2018-08-27 ENCOUNTER — Ambulatory Visit: Payer: Self-pay

## 2018-08-27 ENCOUNTER — Other Ambulatory Visit: Payer: Self-pay

## 2018-08-27 ENCOUNTER — Telehealth: Payer: Self-pay | Admitting: Nurse Practitioner

## 2018-08-27 ENCOUNTER — Telehealth: Payer: Self-pay

## 2018-08-27 DIAGNOSIS — J449 Chronic obstructive pulmonary disease, unspecified: Secondary | ICD-10-CM

## 2018-08-27 DIAGNOSIS — I509 Heart failure, unspecified: Secondary | ICD-10-CM

## 2018-08-27 DIAGNOSIS — Z8674 Personal history of sudden cardiac arrest: Secondary | ICD-10-CM

## 2018-08-27 NOTE — Chronic Care Management (AMB) (Signed)
Chronic Care Management   Follow Up Note   08/27/2018 Name: Kim [name] MRN: [id number] DOB: 1955/06/23  Referred by: Kim Brine, FNP Reason for referral : Chronic Care Management (CCM telephone follow up )   Kim [name] is a 63 y.o. year old female who is a primary care patient of Kim [name], [place]. The CCM team was consulted for assistance with chronic disease management and care coordination needs.    Review of patient status, including review of consultants reports, relevant laboratory and other test results, and collaboration with appropriate care team members and the patient's provider was performed as part of comprehensive patient evaluation and provision of chronic care management services.    I spoke with Ms. Kim [name] by telephone today.   Goals Addressed      Patient Stated   . "I have cough and congestion" "I will only use the Albuterol inhaler" (pt-stated)   On track    Current Barriers:  . Non-adherence to taking medications as presribed   Nurse Case Manager Clinical Goal(s):   Over the next 30 days, patient will have no ED visits or IP admissions secondary to COPD exacerbation.  Interventions:   Telephone CCM follow-up with patient  Assessed respiratory status, noted slight audible strider cleared with cough; patient states symptoms have resolved  Assessed for adherence to refilling/using Albuterol inhaler (pt states she has her inhalers on hand but is not using them, she plans to start today due to the increase in pollen)  Reinforced the importance of patient using her inhalers exactly as directed in order to help prevent new or worsening respiratory s/s or exacerbation  Answered patient questions related to Coronavirus  Reinforced CDC recommendations to help prevent contracting COVID-19, including using good handwashing with antibacterial soap and friction for 20 seconds or longer, washing hands frequently, avoid putting hands around mouth or nose,  social distancing, wear gloves/mask if entering a public place; using an alcohol based hand sanitizer in addition to handwashing, reporting any symptoms of cough/congestion, fever, general malaise as soon as symptoms begin  Confirmed patient has contact numbers for the CCM team and or provider  Discussed ongoing CCM follow up and confirmed patient's best phone #/time to call   Scheduled CCM follow up with patient for 2-3 weeks  Patient Self Care Activities:   Verbalizes understanding of the education/instructions given during today's call  Patient verbalizes understanding of today's instructions/education provided  Patient is able to drive her self to her MD appointments or utilize the bus system  Patient adheres to MD follow-up appointments  Patient is not taking her medications as prescribed due to states, "I don't like taking medicines and some of them are not good for me"  Please see past updates related to this goal by clicking on the "Past Updates" button in the selected goal     . "I need a referral to a Cardiologist" (pt-stated)   Not on track    Current Barriers:  [name] Knowledge Deficit related to post discharge follow-up with Cardiology (referral needed) . 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   Interventions:   Telephone CCM follow up completed with patient   Assessed for s/s of chest pain, shortness of breath and or increased fatigue, nausea or diaphoresis (pt denies)  Confirmed Cardiology referral was approved with Dr.  Shirlee [name] (pt advised appt was cancelled d/t COVID-19)  Patient is requesting to see provider Kim Brine, FNP for clearance to return to work  Collaboration with Kim [name] via secure message re: patient's request Kim [name] will f/u with patient but does not  recommend patient return to work at this time due to COVID-19)   Advised patient of PCP recommendation; advised a request was sent to Kim [name] to contact patient and provided pt contact # and availability  Scheduled a CCM follow up with patient  Patient Self Care Activities:   Patient verbalizes understanding of today's instructions/education provided  Patient is able to drive her self to her MD appointments or utilize the bus system  Patient adheres to MD follow-up  Patient is not taking her medications as prescribed due to states, "I don't like taking medicines and some of them are not good for me"  Plan:  . Patient will call the CCM team any questions or concerns that may arise before her next CCM f/u . Telephone CCM follow-up set for   Please see past updates related to this goal by clicking on the "Past Updates" button in the selected goal     . COMPLETED: "I want to use the [place]" (pt-stated)       Current Barriers:  . Limited education about [name] . Limited social support  Clinical Social Work Clinical Goal(s):  [name] Over the next 15 days, client will follow up with [name] mail order pharmacy as directed by SW  Interventions:  Telephone CCM follow up completed with patient today  Assessed for patient adherence with contacting [employer] to initiate her mail order pharmacy benefits  Confirmed patient has the contact # for [name]; she will call when she is ready (she has no further questions regarding the [facility] mail order benefit at this time)  Goal completed   Patient Self Care Activities:  . Performs ADL's independently . Calls provider office for new concerns or questions  Plan:  . Patient will speak with case manager at the shelter regarding ability to have medications delivered . Patient will contact [employer] mail order pharmacy to provide an address for medications to be delivered . Social Worker willfollow up with the patient in the next  two weeks regarding patient ability to contact [facility] order pharmacy  *initial goal documentation       The CM team will reach out to the patient again over the next 2-3 weeks.    Barb Merino, RN,CCM Care Management Coordinator [place] [facility]  Direct Phone: [phone]

## 2018-08-27 NOTE — Patient Instructions (Signed)
Visit Information  Goals Addressed      Patient Stated   . "I have cough and congestion" "I will only use the Albuterol inhaler" (pt-stated)   On track    Current Barriers:  . Non-adherence to taking medications as presribed   Nurse Case Manager Clinical Goal(s):   Over the next 30 days, patient will have no ED visits or IP admissions secondary to COPD exacerbation.  Interventions:   Telephone CCM follow-up with patient  Assessed respiratory status, noted slight audible strider cleared with cough; patient states symptoms have resolved  Assessed for adherence to refilling/using Albuterol inhaler (pt states she has her inhalers on hand but is not using them, she plans to start today due to the increase in pollen)  Reinforced the importance of patient using her inhalers exactly as directed in order to help prevent new or worsening respiratory s/s or exacerbation  Answered patient questions related to Coronavirus  Reinforced CDC recommendations to help prevent contracting COVID-19, including using good handwashing with antibacterial soap and friction for 20 seconds or longer, washing hands frequently, avoid putting hands around mouth or nose, social distancing, wear gloves/mask if entering a public place; using an alcohol based hand sanitizer in addition to handwashing, reporting any symptoms of cough/congestion, fever, general malaise as soon as symptoms begin  Confirmed patient has contact numbers for the CCM team and or provider  Discussed ongoing CCM follow up and confirmed patient's best phone #/time to call   Scheduled CCM follow up with patient for 2-3 weeks  Patient Self Care Activities:   Verbalizes understanding of the education/instructions given during today's call  Patient verbalizes understanding of today's instructions/education provided  Patient is able to drive her self to her MD appointments or utilize the bus system  Patient adheres to MD follow-up  appointments  Patient is not taking her medications as prescribed due to states, "I don't like taking medicines and some of them are not good for me"   Please see past updates related to this goal by clicking on the "Past Updates" button in the selected goal      . "I need a referral to a Cardiologist" (pt-stated)   Not on track    Current Barriers:  [name] Knowledge Deficit related to post discharge follow-up with Cardiology (referral needed) . 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   Interventions:   Telephone CCM follow up completed with patient   Assessed for s/s of chest pain, shortness of breath and or increased fatigue, nausea or diaphoresis (pt denies)  Confirmed Cardiology referral was approved with Dr. [name] (pt advised appt was cancelled d/t COVID-19)  Patient is requesting to see provider [name], FNP for clearance to return to work  Collaboration with [name] via secure message re: patient's request [name] will f/u with patient but does not recommend patient return to work at this time due to COVID-19)   Advised patient of PCP recommendation; advised a request was sent to [facility] to contact patient and provided pt contact # and availability  Scheduled a CCM follow up with patient  Patient Self Care Activities:   Patient verbalizes understanding of today's instructions/education provided  Patient is able to drive her self to her MD appointments or utilize the bus system  Patient adheres to MD follow-up  Patient is  not taking her medications as prescribed due to states, "I don't like taking medicines and some of them are not good for me"  Plan:  . Patient will call the CCM team any questions or concerns that may arise before her next CCM f/u . Telephone CCM follow-up  set for    Please see past updates related to this goal by clicking on the "Past Updates" button in the selected goal      . COMPLETED: "I want to use the [place]" (pt-stated)       Current Barriers:  . Limited education about [place] . Limited social support  Clinical Social Work Clinical Goal(s):  [name] Over the next 15 days, client will follow up with [facility] mail order pharmacy as directed by SW  Interventions:  Telephone CCM follow up completed with patient today  Assessed for patient adherence with contacting [employer] to initiate her mail order pharmacy benefits  Confirmed patient has the contact # for [facility]; she will call when she is ready (she has no further questions regarding the [facility] mail order benefit at this time)  Goal completed   Patient Self Care Activities:  . Performs ADL's independently . Calls provider office for new concerns or questions  Plan:  . Patient will speak with case manager at the shelter regarding ability to have medications delivered . Patient will contact [employer] mail order pharmacy to provide an address for medications to be delivered . Social Worker willfollow up with the patient in the next two weeks regarding patient ability to contact [facility] order pharmacy  *initial goal documentation      The patient verbalized understanding of instructions provided today and declined a print copy of patient instruction materials.   The CM team will reach out to the patient again over the next 2-3 weeks.   Barb Merino, RN,CCM Care Management Coordinator [place] [facility]  Direct Phone: [phone]

## 2018-08-27 NOTE — Telephone Encounter (Signed)
Received message patient would like to be cleared to go back to work.  At this time she is too high risk to return to work due to [employer].  We will make the patient aware she can not return to work at this time.

## 2018-08-28 ENCOUNTER — Telehealth: Payer: Self-pay

## 2018-09-03 ENCOUNTER — Ambulatory Visit: Payer: Medicare HMO | Admitting: Critical Care Medicine

## 2018-09-05 ENCOUNTER — Other Ambulatory Visit: Payer: Self-pay

## 2018-09-05 ENCOUNTER — Telehealth: Payer: Self-pay | Admitting: *Deleted

## 2018-09-05 DIAGNOSIS — G4733 Obstructive sleep apnea (adult) (pediatric): Secondary | ICD-10-CM | POA: Diagnosis not present

## 2018-09-05 DIAGNOSIS — J449 Chronic obstructive pulmonary disease, unspecified: Secondary | ICD-10-CM | POA: Diagnosis not present

## 2018-09-05 MED ORDER — ALBUTEROL SULFATE HFA 108 (90 BASE) MCG/ACT IN AERS: 2.0000 | INHALATION_SPRAY | Freq: Four times a day (QID) | RESPIRATORY_TRACT | 2 refills | Status: AC | PRN

## 2018-09-05 NOTE — Telephone Encounter (Signed)
Cardiac Questionnaire:    Since your last visit or hospitalization:    1. Have you been having new or worsening chest pain? no   2. Have you been having new or worsening shortness of breath? no 3. Have you been having new or worsening leg swelling, wt gain, or increase in abdominal girth (pants fitting more tightly)? no   4. Have you had any passing out spells? no    *A YES to any of these questions would result in the appointment being kept. *If all the answers to these questions are NO, we should indicate that given the current situation regarding the worldwide coronarvirus pandemic, at the recommendation of the CDC, we are looking to limit gatherings in our waiting area, and thus will reschedule their appointment beyond four weeks from today.   _____YOUR [place] E-VISIT FOR YOUR APPOINTMENT - PLEASE REVIEW IMPORTANT INFORMATION BELOW SEVERAL DAYS PRIOR TO YOUR APPOINTMENT  Due to the recent COVID-19 pandemic, we are transitioning in-person office visits to tele-medicine visits in an effort to decrease unnecessary exposure to our patients, their families, and staff. Medicare and most insurances are covering these visits without a copay needed. We also encourage you to sign up for MyChart if you have not already done so. You will need a smartphone if possible. For patients that do not have this, we can still complete the visit using a regular telephone but do prefer a smartphone to enable video when possible. You may have a family member that lives with you that can help. If possible, we also ask that you have a blood pressure cuff and scale at home to measure your blood pressure, heart rate and weight prior to your scheduled appointment. Patients with clinical needs that need an in-person evaluation and testing will still be able to come to the office if absolutely necessary. If you have any questions, feel free to call our office.     YOUR PROVIDER WILL BE USING  THE FOLLOWING PLATFORM TO COMPLETE YOUR VISIT: Staff: Please delete this text and fill in WebEx/MyChart/Doximity/Doxy.Me   IF USING WEBEX - How to Download the WebEx App to Your SmartPhone  - If Apple device, go to [employer] and type in WebEx in the search bar. [place] [employer], the blue/green circle. If Android, go to [employer] and type in [employer] in the search bar. The app is free but as with any other app download, your phone may require you to verify saved payment information or Apple/Android password.  - You do NOT have to create an account. - On the day of the visit, our staff will walk you through joining the meeting with the meeting number/password.   IF USING MYCHART - How to Download the MyChart App to Your SmartPhone   - If Apple, go to [employer] and type in MyChart in the search bar and download the app. If Android, ask patient to go to [employer] and type in [place] in the search bar and download the app. The app is free but as with any other app downloads, your phone may require you to verify saved payment information or Apple/Android password.  - You will need to then log into the app with your MyChart username and password, and select [facility] as your healthcare provider to link the account. When it is time for your visit, go to the MyChart app, find appointments, and click Begin Video Visit. Be sure to  Select Allow for your device to access the Microphone and Camera for your visit. You will then be connected, and your provider will be with you shortly.  **If you have any issues connecting or need assistance, please contact MyChart service desk [phone])**  **If using a computer, in order to ensure the best quality for your visit, you will need to use either of the following Internet Browsers: [employer], or Google Chrome**   IF USING DOXIMITY or DOXY.ME - The staff will give you instructions on receiving your link to join the  meeting the day of your visit.      2-3 DAYS BEFORE YOUR APPOINTMENT  You will receive a telephone call from one of our [place] team members - your caller ID may say "Unknown caller." If this is a video visit, we will walk you through how to set up your device to be able to complete the visit. We will remind you check your blood pressure, heart rate and weight prior to your scheduled appointment. If you have an Apple Watch or Kardia, please upload any pertinent ECG strips the day before or morning of your appointment to [place]. Our staff will also make sure you have reviewed the consent and agree to move forward with your scheduled tele-health visit.     THE DAY OF YOUR APPOINTMENT  Approximately 15 minutes prior to your scheduled appointment, you will receive a telephone call from one of [facility] [place] team - your caller ID may say "Unknown caller."  Our staff will confirm medications, vital signs for the day and any symptoms you may be experiencing. Please have this information available prior to the time of visit start. It may also be helpful for you to have a pad of paper and pen handy for any instructions given during your visit. They will also walk you through joining the smartphone meeting if this is a video visit.    CONSENT FOR TELE-HEALTH VISIT - PLEASE REVIEW  I hereby voluntarily request, consent and authorize [facility] and its employed or contracted physicians, physician assistants, nurse practitioners or other licensed health care professionals (the Practitioner), to provide me with telemedicine health care services (the Services") as deemed necessary by the treating Practitioner. I acknowledge and consent to receive the Services by the Practitioner via telemedicine. I understand that the telemedicine visit will involve communicating with the Practitioner through live audiovisual communication technology and the disclosure of certain medical information by electronic  transmission. I acknowledge that I have been given the opportunity to request an in-person assessment or other available alternative prior to the telemedicine visit and am voluntarily participating in the telemedicine visit.  I understand that I have the right to withhold or withdraw my consent to the use of telemedicine in the course of my care at any time, without affecting my right to future care or treatment, and that the Practitioner or I may terminate the telemedicine visit at any time. I understand that I have the right to inspect all information obtained and/or recorded in the course of the telemedicine visit and may receive copies of available information for a reasonable fee.  I understand that some of the potential risks of receiving the Services via telemedicine include:   Delay or interruption in medical evaluation due to technological equipment failure or disruption;  Information transmitted may not be sufficient (e.g. poor resolution of images) to allow for appropriate medical decision making by the Practitioner; and/or   In rare instances, security protocols could fail,  causing a breach of personal health information.  Furthermore, I acknowledge that it is my responsibility to provide information about my medical history, conditions and care that is complete and accurate to the best of my ability. I acknowledge that Practitioner's advice, recommendations, and/or decision may be based on factors not within their control, such as incomplete or inaccurate data provided by me or distortions of diagnostic images or specimens that may result from electronic transmissions. I understand that the practice of medicine is not an exact science and that Practitioner makes no warranties or guarantees regarding treatment outcomes. I acknowledge that I will receive a copy of this consent concurrently upon execution via email to the email address I last provided but may also request a printed copy by  calling the office of [place].    I understand that my insurance will be billed for this visit.   I have read or had this consent read to me.  I understand the contents of this consent, which adequately explains the benefits and risks of the Services being provided via telemedicine.   I have been provided ample opportunity to ask questions regarding this consent and the Services and have had my questions answered to my satisfaction.  I give my informed consent for the services to be provided through the use of telemedicine in my medical care  By participating in this telemedicine visit I agree to the above. _Pt gives consent_______   DJTTS-17 Pre-Screening Questions:   Do you currently have a fever? no (yes = cancel and refer to pcp for e-visit)  Have you recently travelled on a cruise, internationally, or to [place], [place], [place], [place], [place], or [place], [place]) ? no (yes = cancel, stay home, monitor symptoms, and contact pcp or initiate e-visit if symptoms develop)  Have you been in contact with someone that is currently pending confirmation of Covid19 testing or has been confirmed to have the [place] virus?  no (yes = cancel, stay home, away from tested individual, monitor symptoms, and contact pcp or initiate e-visit if symptoms develop)  Are you currently experiencing fatigue or cough? no (yes = pt should be prepared to have a mask placed at the time of their visit).

## 2018-09-08 DIAGNOSIS — Z0389 Encounter for observation for other suspected diseases and conditions ruled out: Secondary | ICD-10-CM

## 2018-09-08 DIAGNOSIS — IMO0001 Reserved for inherently not codable concepts without codable children: Secondary | ICD-10-CM | POA: Insufficient documentation

## 2018-09-09 ENCOUNTER — Other Ambulatory Visit: Payer: Self-pay

## 2018-09-09 ENCOUNTER — Telehealth: Payer: Medicare HMO | Admitting: Cardiology

## 2018-09-12 ENCOUNTER — Ambulatory Visit (INDEPENDENT_AMBULATORY_CARE_PROVIDER_SITE_OTHER): Payer: Medicare HMO

## 2018-09-12 ENCOUNTER — Ambulatory Visit: Payer: Self-pay | Admitting: Pharmacist

## 2018-09-12 DIAGNOSIS — J449 Chronic obstructive pulmonary disease, unspecified: Secondary | ICD-10-CM | POA: Diagnosis not present

## 2018-09-12 DIAGNOSIS — I509 Heart failure, unspecified: Secondary | ICD-10-CM

## 2018-09-12 DIAGNOSIS — J439 Emphysema, unspecified: Secondary | ICD-10-CM | POA: Diagnosis not present

## 2018-09-12 NOTE — Patient Instructions (Signed)
Social Worker Visit Information  Goals we discussed today:  Goals Addressed            This Visit's Progress     Patient Stated   . "I need help getting incontinent stuff" (pt-stated)       Current Barriers:  . Financial constraints . Limited social support  . Lacks knowledge of how to obtain through Medicaid benefit  Clinical Social Work Clinical Goal(s):  [name] Over the next 30 days, client will work with SW to address concerns related to DME needs  Interventions: . Patient interviewed and appropriate assessments performed . Provided patient with information about Medicaid benefit . Discussed plans with patient for ongoing care management follow up and provided patient with direct contact information for care management team  . [place] (formerly [place]) to discuss patient DME need . Collaborated with the patients provider office regarding prescription for supplies sent to [facility]  Patient Self Care Activities:  . Attends all scheduled provider appointments . Calls pharmacy for medication refills . Calls provider office for new concerns or questions  Initial goal documentation         Materials Provided: Verbal education about Medicaid benefit provided by phone  Follow Up Plan: SW will follow up with patient by phone over the next week.   [name], BSW, CDP [facility] / [facility] Care Management Social Worker [phone]
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
## 2018-09-13 ENCOUNTER — Ambulatory Visit: Payer: Self-pay | Admitting: Pharmacist

## 2018-09-13 ENCOUNTER — Other Ambulatory Visit: Payer: Self-pay

## 2018-09-13 ENCOUNTER — Telehealth: Payer: Self-pay

## 2018-09-13 ENCOUNTER — Telehealth: Payer: Medicare HMO | Admitting: Cardiology

## 2018-09-13 DIAGNOSIS — I2510 Atherosclerotic heart disease of native coronary artery without angina pectoris: Secondary | ICD-10-CM | POA: Insufficient documentation

## 2018-09-13 DIAGNOSIS — J449 Chronic obstructive pulmonary disease, unspecified: Secondary | ICD-10-CM

## 2018-09-13 DIAGNOSIS — I509 Heart failure, unspecified: Secondary | ICD-10-CM

## 2018-09-13 DIAGNOSIS — J439 Emphysema, unspecified: Secondary | ICD-10-CM | POA: Diagnosis not present
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
## 2018-09-16 ENCOUNTER — Ambulatory Visit: Payer: Self-pay | Admitting: Pharmacist

## 2018-09-16 ENCOUNTER — Ambulatory Visit: Payer: Self-pay

## 2018-09-16 DIAGNOSIS — I509 Heart failure, unspecified: Secondary | ICD-10-CM

## 2018-09-16 DIAGNOSIS — Z8674 Personal history of sudden cardiac arrest: Secondary | ICD-10-CM

## 2018-09-16 DIAGNOSIS — J439 Emphysema, unspecified: Secondary | ICD-10-CM

## 2018-09-16 DIAGNOSIS — J449 Chronic obstructive pulmonary disease, unspecified: Secondary | ICD-10-CM

## 2018-09-16 DIAGNOSIS — R7303 Prediabetes: Secondary | ICD-10-CM

## 2018-09-16 NOTE — Patient Instructions (Signed)
Social Worker Visit Information  Goals we discussed today:  Goals Addressed            This Visit's Progress     Patient Stated   . "I need help getting incontinent stuff" (pt-stated)       Current Barriers:  . Financial constraints . Limited social support  . Lacks knowledge of how to obtain through Medicaid benefit  Clinical Social Work Clinical Goal(s):  [name] Over the next 30 days, client will work with SW to address concerns related to DME needs  Interventions: . Telephonic follow up by CCM SW to confirm receipt of requested products . Provided call back information to [facility] requesting a return call from the patient  Patient Self Care Activities:  . Attends all scheduled provider appointments . Calls pharmacy for medication refills . Calls provider office for new concerns or questions  Please see past updates related to this goal by clicking on the "Past Updates" button in the selected goal      . "I want to find a face mask" (pt-stated)       Goal identified by patient on 4/23 - late entry Current Barriers:  . Financial constraints  . Limited quantity available due to COVID 19 pandemic  Clinical Social Work Clinical Goal(s):  [name] Over the next 20 days, client will work with SW to address concerns related to locating a face covering  Interventions:  Identified program through [place] to hand out free face coverings on Wednesday April 29  Telephonic CCM follow up by SW to the patient  Message left with front desk attendant at the [facility] requesting a return call from the patient  SW informed the attendant of the free face covering event to notify the patient  Patient Self Care Activities:  . Self administers medications as prescribed . Attends all scheduled provider appointments . Calls provider office for new concerns or questions  Please see past updates related to this goal by clicking on the "Past Updates" button in the selected goal           Materials Provided: No. Patient not reached.  Follow Up Plan: SW will follow up with patient by phone over the next two days  [name], BSW, CDP TIM[facility] / [place] Management Social Worker [phone]

## 2018-09-16 NOTE — Chronic Care Management (AMB) (Signed)
  Chronic Care Management    Clinical Social Work Follow Up Note  09/16/2018 Name: Kim [name] MRN: [id number] DOB: [birth year]  Kim [name] is a 63 y.o. year old female who is a primary care patient of [name], [place]. The CCM team was consulted for assistance with [name].   Review of patient status, including review of consultants reports, other relevant assessments, and collaboration with appropriate care team members and the patient's provider was performed as part of comprehensive patient evaluation and provision of chronic care management services.     Goals Addressed            This Visit's Progress     Patient Stated   . "I need help getting incontinent stuff" (pt-stated)       Current Barriers:  . Financial constraints . Limited social support  . Lacks knowledge of how to obtain through Medicaid benefit  Clinical Social Work Clinical Goal(s):  [name] Over the next 30 days, client will work with SW to address concerns related to DME needs  Interventions: . Telephonic follow up by CCM SW to confirm receipt of requested products . Provided call back information to [facility] requesting a return call from the patient  Patient Self Care Activities:  . Attends all scheduled provider appointments . Calls pharmacy for medication refills . Calls provider office for new concerns or questions  Please see past updates related to this goal by clicking on the "Past Updates" button in the selected goal      . "I want to find a face mask" (pt-stated)       Current Barriers:  . Financial constraints  . Limited quantity available due to COVID 19 pandemic  Clinical Social Work Clinical Goal(s):  [name] Over the next 20 days, client will work with SW to address concerns related to locating a face covering  Interventions:  Identified program through [place] to hand out free face coverings on Wednesday April 29  Telephonic CCM follow up by SW to the patient   Message left with front desk attendant at the [facility] requesting a return call from the patient  SW informed the attendant of the free face covering event to notify the patient  Patient Self Care Activities:  . Self administers medications as prescribed . Attends all scheduled provider appointments . Calls provider office for new concerns or questions  Please see past updates related to this goal by clicking on the "Past Updates" button in the selected goal          Follow Up Plan: SW will follow up with patient by phone over the next two days.   [name], BSW, CDP [facility] / [facility] Care Management Social Worker [phone]  Total time spent performing care coordination and/or care management activities with the patient by phone or face to face = 10 minutes.

## 2018-09-17 ENCOUNTER — Telehealth: Payer: Self-pay

## 2018-09-17 ENCOUNTER — Ambulatory Visit: Payer: Self-pay

## 2018-09-17 DIAGNOSIS — J439 Emphysema, unspecified: Secondary | ICD-10-CM | POA: Diagnosis not present

## 2018-09-17 DIAGNOSIS — J449 Chronic obstructive pulmonary disease, unspecified: Secondary | ICD-10-CM | POA: Diagnosis not present

## 2018-09-17 DIAGNOSIS — I509 Heart failure, unspecified: Secondary | ICD-10-CM

## 2018-09-17 DIAGNOSIS — Z8674 Personal history of sudden cardiac arrest: Secondary | ICD-10-CM

## 2018-09-17 NOTE — Chronic Care Management (AMB) (Signed)
  Chronic Care Management   Outreach Note  09/17/2018 Name: Kim [name] MRN: [id number] DOB: December 10, 1955  Referred by: [name], FNP Reason for referral : Chronic Care Management (CCM Telephone Follow Up )   An unsuccessful telephone outreach was attempted today. The patient was referred to the case management team by [name] FNP for assistance with COPD, CHF, hx of cardiac arrest.   Follow Up Plan: The CM team will reach out to the patient again over the next 5-7 days.     Barb Merino, RN,CCM Care Management Coordinator [place] [facility]  Direct Phone: [phone]
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
## 2018-09-18 ENCOUNTER — Ambulatory Visit: Payer: Self-pay

## 2018-09-18 ENCOUNTER — Telehealth: Payer: Self-pay

## 2018-09-18 DIAGNOSIS — I509 Heart failure, unspecified: Secondary | ICD-10-CM

## 2018-09-18 DIAGNOSIS — J449 Chronic obstructive pulmonary disease, unspecified: Secondary | ICD-10-CM

## 2018-09-18 DIAGNOSIS — Z8674 Personal history of sudden cardiac arrest: Secondary | ICD-10-CM

## 2018-09-18 NOTE — Patient Instructions (Signed)
Social Worker Visit Information  Goals we discussed today:  Goals Addressed            This Visit's Progress     Patient Stated   . "I want to find a face mask" (pt-stated)       Goal identified by patient on 4/23 - late entry Current Barriers:  . Financial constraints  . Limited quantity available due to COVID 19 pandemic  Clinical Social Work Clinical Goal(s):  [name] Over the next 20 days, client will work with SW to address concerns related to locating a face covering  Interventions:  Telephonic CCM SW follow up - patient unavailable  Discussed previous message left for the patient by SW; [employer] attendant confirmed the patient was given the message  Requested patient be reminded to contact CCM team  Patient Self Care Activities:  . Self administers medications as prescribed . Attends all scheduled provider appointments . Calls provider office for new concerns or questions  Please see past updates related to this goal by clicking on the "Past Updates" button in the selected goal          Materials Provided: No. Patient not reached.  Follow Up Plan: SW will follow up with patient by phone over the next 10-14 days   [name], BSW, CDP [facility] / [place] [facility] Social Worker [phone]

## 2018-09-18 NOTE — Chronic Care Management (AMB) (Signed)
  Chronic Care Management    Clinical Social Work Follow Up Note  09/18/2018 Name: Kim [name] MRN: [id number] DOB: 08/11/1955  [name] is a 63 y.o. year old female who is a primary care patient of [name], [place]. The CCM team was consulted for assistance with [name].   Review of patient status, including review of consultants reports, other relevant assessments, and collaboration with appropriate care team members and the patient's provider was performed as part of comprehensive patient evaluation and provision of chronic care management services.     Goals Addressed            This Visit's Progress     Patient Stated   . "I want to find a face mask" (pt-stated)       Goal identified by patient on 4/23 - late entry Current Barriers:  . Financial constraints  . Limited quantity available due to COVID 19 pandemic  Clinical Social Work Clinical Goal(s):  [name] Over the next 20 days, client will work with SW to address concerns related to locating a face covering  Interventions:  Telephonic CCM SW follow up - patient unavailable  Discussed previous message left for the patient by SW; [employer] attendant confirmed the patient was given the message  Requested patient be reminded to contact CCM team  Patient Self Care Activities:  . Self administers medications as prescribed . Attends all scheduled provider appointments . Calls provider office for new concerns or questions  Please see past updates related to this goal by clicking on the "Past Updates" button in the selected goal          Follow Up Plan: SW will follow up with patient by phone over the next 10-14 days  [name], BSW, CDP [facility] / [place] Worker [phone]  Total time spent performing care coordination and/or care management activities with the patient by phone or face to face = 8 minutes.

## 2018-09-19 ENCOUNTER — Telehealth: Payer: Medicare HMO | Admitting: Cardiology

## 2018-09-19 NOTE — Progress Notes (Deleted)
{Choose 1 Note Type (Telehealth Visit or Telephone Visit):[phone]}   Evaluation Performed:  Follow-up visit  Date:  09/19/2018   ID:  Kim [name], DOB 1956/03/10, MRN [id number]  {Patient Location:[phone]::"Home"} {Provider [phone]}  PCP:  [name], FNP  Cardiologist:  No primary care provider on file. *** Electrophysiologist:  None   Chief Complaint:  ***  History of Present Illness:    Kim [name] is a 63 y.o. female with a history of COPD, respiratory arrest, diastolic heart failure with respiratory failure and mechanical intubation , hypertension and hyperlipidemia last seen by Dr Gwenlyn Found 12/16/14.  She is is being seen today for the evaluation of CAD at the request of [name], [place].  ANGIOGRAPHIC RESULTS:  02/09/14 1. Left main; normal  2. LAD; normal 3. Left circumflex; normal.  4. Right coronary artery; dominant and normal 5. Left ventriculography;not performed   Lorretta Harp. MD, [facility] 02/09/2014  The patient {does/does not:200015} have symptoms concerning for COVID-19 infection (fever, chills, cough, or new shortness of breath).    Past Medical History:  Diagnosis Date  . CHF (congestive heart failure) ([place])   . COPD (chronic obstructive pulmonary disease) ([place])   . Diabetes mellitus without complication ([place])   . GERD (gastroesophageal reflux disease)   . History of DVT (deep vein thrombosis)   . Hypercholesteremia   . Hyperlipemia   . Normal coronary arteries    by cardiac catheterization which I performed September 2015  . Schizophrenia ([place])   . Tobacco abuse    Past Surgical History:  Procedure Laterality Date  . LEFT HEART CATHETERIZATION WITH CORONARY ANGIOGRAM N/A 02/09/2014   Procedure: LEFT HEART CATHETERIZATION WITH CORONARY ANGIOGRAM;  Surgeon: Lorretta Harp, MD;  Location: [facility] CATH LAB;  Service: Cardiovascular;  Laterality: N/A;  . none       No outpatient medications have been marked as taking for the  09/19/18 encounter (Appointment) with Richardo Priest, MD.     Allergies:   Asa [aspirin]; Metformin and related; Penicillins; Quetiapine; and Acetaminophen   Social History   Tobacco Use  . Smoking status: Current Some Day Smoker    Packs/day: 1.00    Years: 21.00    Pack years: 21.00    Types: Cigarettes  . Smokeless tobacco: Never Used  . Tobacco comment: smoking 1/2-1ppd   Substance Use Topics  . Alcohol use: No  . Drug use: No     Family Hx: The patient's family history includes CAD in her father; Schizophrenia in her brother.  ROS:   Please see the history of present illness.    *** All other systems reviewed and are negative.   Prior CV studies:   The following studies were reviewed today:  ***  Labs/Other Tests and Data Reviewed:    EKG:  An ECG dated 06/04/18 was personally reviewed today and demonstrated:  Sinus tachycardia RAD RAE LAE incomplete RBBB  COPD pattern  Recent Labs: 06/11/2018: Magnesium 1.8 08/08/2018: ALT 9; BUN 8; Creatinine, Ser 0.71; Hemoglobin 15.1; Platelets 345; Potassium 5.1; Sodium 136; TSH 1.830   Recent Lipid Panel No results found for: CHOL, TRIG, HDL, CHOLHDL, LDLCALC, LDLDIRECT  Wt Readings from Last 3 Encounters:  08/08/18 150 lb (68 kg)  08/08/18 150 lb (68 kg)  07/18/18 154 lb 12.8 oz (70.2 kg)     Objective:    Vital Signs:  There were no vitals taken for this visit.   {HeartCare Virtual Exam (Optional):[phone]::"VITAL SIGNS:  reviewed"}  ASSESSMENT & PLAN:  1. ***  COVID-19 Education: The signs and symptoms of COVID-19 were discussed with the patient and how to seek care for testing (follow up with PCP or arrange E-visit).  ***The importance of social distancing was discussed today.  Time:   Today, I have spent *** minutes with the patient with telehealth technology discussing the above problems.     Medication Adjustments/Labs and Tests Ordered: Current medicines are reviewed at length with the patient  today.  Concerns regarding medicines are outlined above.   Tests Ordered: No orders of the defined types were placed in this encounter.   Medication Changes: No orders of the defined types were placed in this encounter.   Disposition:  Follow up {follow up:15908}  Signed, [name], MD  09/19/2018 7:51 AM    [facility]

## 2018-09-20 ENCOUNTER — Telehealth: Payer: Self-pay

## 2018-09-24 ENCOUNTER — Telehealth: Payer: Self-pay

## 2018-09-25 ENCOUNTER — Telehealth: Payer: Self-pay

## 2018-09-26 ENCOUNTER — Telehealth: Payer: Self-pay

## 2018-09-26 ENCOUNTER — Ambulatory Visit: Payer: Self-pay

## 2018-09-26 DIAGNOSIS — J439 Emphysema, unspecified: Secondary | ICD-10-CM

## 2018-09-26 DIAGNOSIS — I509 Heart failure, unspecified: Secondary | ICD-10-CM

## 2018-09-26 DIAGNOSIS — J449 Chronic obstructive pulmonary disease, unspecified: Secondary | ICD-10-CM

## 2018-09-26 DIAGNOSIS — F209 Schizophrenia, unspecified: Secondary | ICD-10-CM

## 2018-09-26 NOTE — Chronic Care Management (AMB) (Signed)
  Chronic Care Management   Outreach Note  09/26/2018 Name: Kim [name] MRN: [id number] DOB: 1955/09/13  Referred by: [name], FNP Reason for referral : Care Coordination   Third unsuccessful telephone outreach was attempted today. The patient was referred to the case management team for assistance with chronic care management and care coordination. The patient's primary care provider has been notified of our unsuccessful attempts to make or maintain contact with the patient. The care management team is pleased to engage with this patient at any time in the future should he/she be interested in assistance from the care management team.   Follow Up Plan: No further follow up planned by [place], BSW, CDP [facility] / [place] Worker [phone]  Total time spent performing care coordination and/or care management activities with the patient by phone or face to face = 5 minutes.

## 2018-09-27 ENCOUNTER — Telehealth: Payer: Self-pay

## 2018-09-30 ENCOUNTER — Ambulatory Visit: Payer: Self-pay | Admitting: Pharmacist

## 2018-09-30 NOTE — Progress Notes (Signed)
  Chronic Care Management   Outreach Note  09/30/2018 Name: Kim [name] MRN: [id number] DOB: March 24, 1956  Referred by: [name], FNP Reason for referral : Chronic Care Management   An unsuccessful telephone outreach was attempted today. The patient was referred to the case management team by for assistance with chronic care management and care coordination.   Follow Up Plan: CM Pharmacist will attempt last outreach to the patient again over the next 7 days.    Regina Eck, PharmD, BCPS Clinical Pharmacist, [facility] [place]: [phone]

## 2018-10-01 ENCOUNTER — Ambulatory Visit: Payer: Self-pay

## 2018-10-01 DIAGNOSIS — F209 Schizophrenia, unspecified: Secondary | ICD-10-CM

## 2018-10-01 DIAGNOSIS — J449 Chronic obstructive pulmonary disease, unspecified: Secondary | ICD-10-CM

## 2018-10-01 DIAGNOSIS — I509 Heart failure, unspecified: Secondary | ICD-10-CM

## 2018-10-01 NOTE — Chronic Care Management (AMB) (Signed)
  Chronic Care Management   Outreach Note  10/01/2018 Name: Kim [name] MRN: [id number] DOB: 1955-10-07  Referred by: [name], FNP Reason for referral : Chronic Care Management (CCM RN Telephone Follow Up )   An unsuccessful telephone outreach was attempted today. The patient was referred to the case management team by [name] FNP for assistance with chronic care management and care coordination.    Follow Up Plan: The CCM team will reach out to the patient again over the next 7-10 days.    Barb Merino, RN,CCM Care Management Coordinator [place] [facility]  Direct Phone: [phone]

## 2018-10-02 DIAGNOSIS — F209 Schizophrenia, unspecified: Secondary | ICD-10-CM | POA: Diagnosis not present

## 2018-10-04 ENCOUNTER — Ambulatory Visit: Payer: Self-pay | Admitting: Pharmacist

## 2018-10-04 DIAGNOSIS — F209 Schizophrenia, unspecified: Secondary | ICD-10-CM

## 2018-10-04 DIAGNOSIS — I509 Heart failure, unspecified: Secondary | ICD-10-CM

## 2018-10-04 DIAGNOSIS — J449 Chronic obstructive pulmonary disease, unspecified: Secondary | ICD-10-CM
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
## 2018-10-05 DIAGNOSIS — J449 Chronic obstructive pulmonary disease, unspecified: Secondary | ICD-10-CM | POA: Diagnosis not present

## 2018-10-05 DIAGNOSIS — G4733 Obstructive sleep apnea (adult) (pediatric): Secondary | ICD-10-CM | POA: Diagnosis not present

## 2018-10-07 ENCOUNTER — Telehealth: Payer: Self-pay

## 2018-10-08 ENCOUNTER — Ambulatory Visit (INDEPENDENT_AMBULATORY_CARE_PROVIDER_SITE_OTHER): Payer: Medicare HMO

## 2018-10-08 ENCOUNTER — Other Ambulatory Visit: Payer: Self-pay

## 2018-10-08 ENCOUNTER — Telehealth: Payer: Self-pay

## 2018-10-08 DIAGNOSIS — I509 Heart failure, unspecified: Secondary | ICD-10-CM

## 2018-10-08 DIAGNOSIS — F209 Schizophrenia, unspecified: Secondary | ICD-10-CM

## 2018-10-08 DIAGNOSIS — J449 Chronic obstructive pulmonary disease, unspecified: Secondary | ICD-10-CM | POA: Diagnosis not present

## 2018-10-08 DIAGNOSIS — J439 Emphysema, unspecified: Secondary | ICD-10-CM | POA: Diagnosis not present

## 2018-10-09 ENCOUNTER — Ambulatory Visit: Payer: Self-pay | Admitting: Pharmacist

## 2018-10-09 ENCOUNTER — Ambulatory Visit (INDEPENDENT_AMBULATORY_CARE_PROVIDER_SITE_OTHER): Payer: Medicare HMO | Admitting: Nurse Practitioner

## 2018-10-09 ENCOUNTER — Ambulatory Visit: Payer: Medicare HMO | Admitting: Nurse Practitioner

## 2018-10-09 ENCOUNTER — Encounter: Payer: Self-pay | Admitting: Nurse Practitioner

## 2018-10-09 VITALS — BP 120/86 | HR 63 | Temp 98.4°F | Wt 165.0 lb

## 2018-10-09 DIAGNOSIS — J449 Chronic obstructive pulmonary disease, unspecified: Secondary | ICD-10-CM

## 2018-10-09 DIAGNOSIS — I509 Heart failure, unspecified: Secondary | ICD-10-CM

## 2018-10-09 DIAGNOSIS — R7303 Prediabetes: Secondary | ICD-10-CM

## 2018-10-09 DIAGNOSIS — J439 Emphysema, unspecified: Secondary | ICD-10-CM | POA: Diagnosis not present

## 2018-10-09 DIAGNOSIS — F209 Schizophrenia, unspecified: Secondary | ICD-10-CM | POA: Diagnosis not present

## 2018-10-09 DIAGNOSIS — I10 Essential (primary) hypertension: Secondary | ICD-10-CM | POA: Diagnosis not present
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
## 2018-10-09 NOTE — Patient Instructions (Signed)
   CALL DR. ALVA FOR APPT WITH PULMONOLOGIST  CALL AND MAKE AN APPT WITH DR. Para March TO ENSURE YOU ARE BETTER AND IMPROVED BEFORE WE CAN HAVE YOU TO RETURN TO WORK  ORDERING A REPEAT CT SCAN OF YOUR LUNGS TO SEE IF BETTER
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
## 2018-10-18 ENCOUNTER — Encounter: Payer: Self-pay | Admitting: Nurse Practitioner

## 2018-10-22 ENCOUNTER — Telehealth: Payer: Self-pay

## 2018-10-23 ENCOUNTER — Telehealth: Payer: Self-pay

## 2018-10-24 ENCOUNTER — Ambulatory Visit: Payer: Self-pay

## 2018-10-24 ENCOUNTER — Telehealth: Payer: Self-pay

## 2018-10-24 DIAGNOSIS — R7303 Prediabetes: Secondary | ICD-10-CM

## 2018-10-24 DIAGNOSIS — F209 Schizophrenia, unspecified: Secondary | ICD-10-CM

## 2018-10-24 DIAGNOSIS — J449 Chronic obstructive pulmonary disease, unspecified: Secondary | ICD-10-CM

## 2018-10-24 NOTE — Chronic Care Management (AMB) (Signed)
  Chronic Care Management   Outreach Note  10/24/2018 Name: Kim [name] MRN: [id number] DOB: 02-Aug-1955  Referred by: [name], FNP Reason for referral : Chronic Care Management (CCM CMRN Telephone Follow Up )  An unsuccessful telephone outreach was attempted today. The patient was referred to the case management team by [name] FNP for assistance with chronic care management and care coordination.     Follow Up Plan: The care management team will reach out to the patient again over the next 5-7 days.    Barb Merino, RN,CCM Care Management Coordinator [place] [facility]  Direct Phone: [phone]

## 2018-10-30 ENCOUNTER — Ambulatory Visit (INDEPENDENT_AMBULATORY_CARE_PROVIDER_SITE_OTHER): Payer: Medicare HMO

## 2018-10-30 DIAGNOSIS — I509 Heart failure, unspecified: Secondary | ICD-10-CM

## 2018-10-30 DIAGNOSIS — J449 Chronic obstructive pulmonary disease, unspecified: Secondary | ICD-10-CM | POA: Diagnosis not present

## 2018-10-30 NOTE — Progress Notes (Signed)
  Chronic Care Management   Outreach Note  10/30/2018 Name: Kim [name] MRN: [id number] DOB: 1955/11/19  Referred by: [name], FNP Reason for referral : Chronic Care Management   An unsuccessful telephone outreach was attempted today. The patient was referred to the case management team by for assistance with chronic care management and care coordination.   Follow Up Plan: A HIPPA compliant phone message was left at the [facility] front desk for the patient providing contact information and requesting a return call.   Regina Eck, PharmD, BCPS Clinical Pharmacist, [facility] [place]: [phone]

## 2018-11-01 ENCOUNTER — Ambulatory Visit: Payer: Self-pay

## 2018-11-01 ENCOUNTER — Telehealth: Payer: Self-pay

## 2018-11-01 DIAGNOSIS — I509 Heart failure, unspecified: Secondary | ICD-10-CM

## 2018-11-01 DIAGNOSIS — J449 Chronic obstructive pulmonary disease, unspecified: Secondary | ICD-10-CM

## 2018-11-01 DIAGNOSIS — F209 Schizophrenia, unspecified: Secondary | ICD-10-CM

## 2018-11-01 DIAGNOSIS — R7303 Prediabetes: Secondary | ICD-10-CM

## 2018-11-01 NOTE — Chronic Care Management (AMB) (Signed)
  Chronic Care Management   Outreach Note  11/01/2018 Name: Kim [name] MRN: [id number] DOB: 1955-09-29  Referred by: [name], FNP Reason for referral : Chronic Care Management (CCM RNCM Telephone Follow Up)   An unsuccessful telephone outreach was attempted today. The patient was referred to the case management team by [name] FNP for assistance with chronic care management and care coordination. [name] had already left the [facility] for the day and was not available when I attempted to reach her.   Follow Up Plan: The care management team will reach out to the patient again over the next 7-10 days.     Barb Merino, RN,CCM Care Management Coordinator [place] [facility]  Direct Phone: [phone]

## 2018-11-05 ENCOUNTER — Telehealth: Payer: Self-pay

## 2018-11-05 DIAGNOSIS — J449 Chronic obstructive pulmonary disease, unspecified: Secondary | ICD-10-CM | POA: Diagnosis not present

## 2018-11-05 DIAGNOSIS — G4733 Obstructive sleep apnea (adult) (pediatric): Secondary | ICD-10-CM | POA: Diagnosis not present

## 2018-11-13 ENCOUNTER — Telehealth: Payer: Self-pay

## 2018-11-15 ENCOUNTER — Telehealth: Payer: Self-pay

## 2018-11-15 ENCOUNTER — Ambulatory Visit: Payer: Self-pay

## 2018-11-15 DIAGNOSIS — I10 Essential (primary) hypertension: Secondary | ICD-10-CM

## 2018-11-15 DIAGNOSIS — F209 Schizophrenia, unspecified: Secondary | ICD-10-CM

## 2018-11-15 DIAGNOSIS — J449 Chronic obstructive pulmonary disease, unspecified: Secondary | ICD-10-CM

## 2018-11-15 DIAGNOSIS — I509 Heart failure, unspecified: Secondary | ICD-10-CM

## 2018-11-15 DIAGNOSIS — R7303 Prediabetes: Secondary | ICD-10-CM

## 2018-11-15 NOTE — Chronic Care Management (AMB) (Signed)
  Chronic Care Management   Outreach Note  11/15/2018 Name: Kim [name] MRN: [id number] DOB: 1955-11-16  Referred by: [name], FNP Reason for referral : Chronic Care Management (CCM RNCM Telephone Follow Up )   A second unsuccessful telephone outreach was attempted today. The patient was referred to the case management team for assistance with chronic care management and care coordination.   Follow Up Plan: A HIPPA compliant phone message was left for the patient providing contact information and requesting a return call.  Telephone follow up appointment with care management team member scheduled for: 12/03/18  Barb Merino, RN, BSN, CCM Care Management Coordinator [place] [facility]  Direct Phone: [phone]

## 2018-12-03 ENCOUNTER — Telehealth: Payer: Self-pay

## 2018-12-05 ENCOUNTER — Ambulatory Visit: Payer: Self-pay

## 2018-12-05 DIAGNOSIS — I509 Heart failure, unspecified: Secondary | ICD-10-CM

## 2018-12-05 DIAGNOSIS — R7303 Prediabetes: Secondary | ICD-10-CM

## 2018-12-05 DIAGNOSIS — F209 Schizophrenia, unspecified: Secondary | ICD-10-CM

## 2018-12-05 DIAGNOSIS — I10 Essential (primary) hypertension: Secondary | ICD-10-CM

## 2018-12-05 DIAGNOSIS — G4733 Obstructive sleep apnea (adult) (pediatric): Secondary | ICD-10-CM | POA: Diagnosis not present

## 2018-12-05 DIAGNOSIS — J449 Chronic obstructive pulmonary disease, unspecified: Secondary | ICD-10-CM | POA: Diagnosis not present

## 2018-12-05 NOTE — Chronic Care Management (AMB) (Signed)
  Chronic Care Management   Outreach Note  12/05/2018 Name: Kim [name] MRN: [id number] DOB: [birth year]  Referred by: [name], FNP Reason for referral : Chronic Care Management (CCM RNCM Telephone Follow up )   Third unsuccessful telephone outreach was attempted today. The patient was referred to the case management team for assistance with chronic care management and care coordination. The patient's primary care provider has been notified of our unsuccessful attempts to make or maintain contact with the patient. The care management team is pleased to engage with this patient at any time in the future should he/she be interested in assistance from the care management team.   Follow Up Plan: A HIPPA compliant phone message was left for the patient providing contact information and requesting a return call.  The care management team is available to follow up with the patient after provider conversation with the patient regarding recommendation for care management engagement and subsequent re-referral to the care management team.    Barb Merino, RN, BSN, CCM Care Management Coordinator [place] [facility]  Direct Phone: [phone]

## 2018-12-12 ENCOUNTER — Telehealth: Payer: Self-pay

## 2018-12-12 ENCOUNTER — Ambulatory Visit (INDEPENDENT_AMBULATORY_CARE_PROVIDER_SITE_OTHER): Payer: Medicare HMO

## 2018-12-12 DIAGNOSIS — J449 Chronic obstructive pulmonary disease, unspecified: Secondary | ICD-10-CM | POA: Diagnosis not present

## 2018-12-12 DIAGNOSIS — R7303 Prediabetes: Secondary | ICD-10-CM

## 2018-12-12 DIAGNOSIS — I509 Heart failure, unspecified: Secondary | ICD-10-CM | POA: Diagnosis not present

## 2018-12-12 DIAGNOSIS — F209 Schizophrenia, unspecified: Secondary | ICD-10-CM

## 2018-12-12 NOTE — Chronic Care Management (AMB) (Addendum)
  Chronic Care Management   Outreach Note  12/12/2018 Name: Kim [name] MRN: [id number] DOB: 1956-01-07  Referred by: [name], FNP Reason for referral : Chronic Care Management (CCM RNCM Telephone Follow up )   Third unsuccessful telephone outreach was attempted today. The patient was referred to the case management team for assistance with chronic care management and care coordination. The patient's primary care provider has been notified of our unsuccessful attempts to make or maintain contact with the patient. The care management team is pleased to engage with this patient at any time in the future should he/she be interested in assistance from the care management team.   Follow Up Plan: The care management team is available to follow up with the patient after provider conversation with the patient regarding recommendation for care management engagement and subsequent re-referral to the care management team. If patient returns call to provider office, please advise to call [place] RN Glenard Haring Kwesi Sangha at [phone].    Barb Merino, RN, BSN, CCM Care Management Coordinator [place] [facility]  Direct Phone: [phone]

## 2018-12-23 IMAGING — CR DG CHEST 2V
2 series · 2 of 2 positions shown · non-contrast
Comparison: 06/24/2016

CLINICAL DATA: Dry cough for a few days.

EXAM:
CHEST - 2 VIEW

[chest pa]
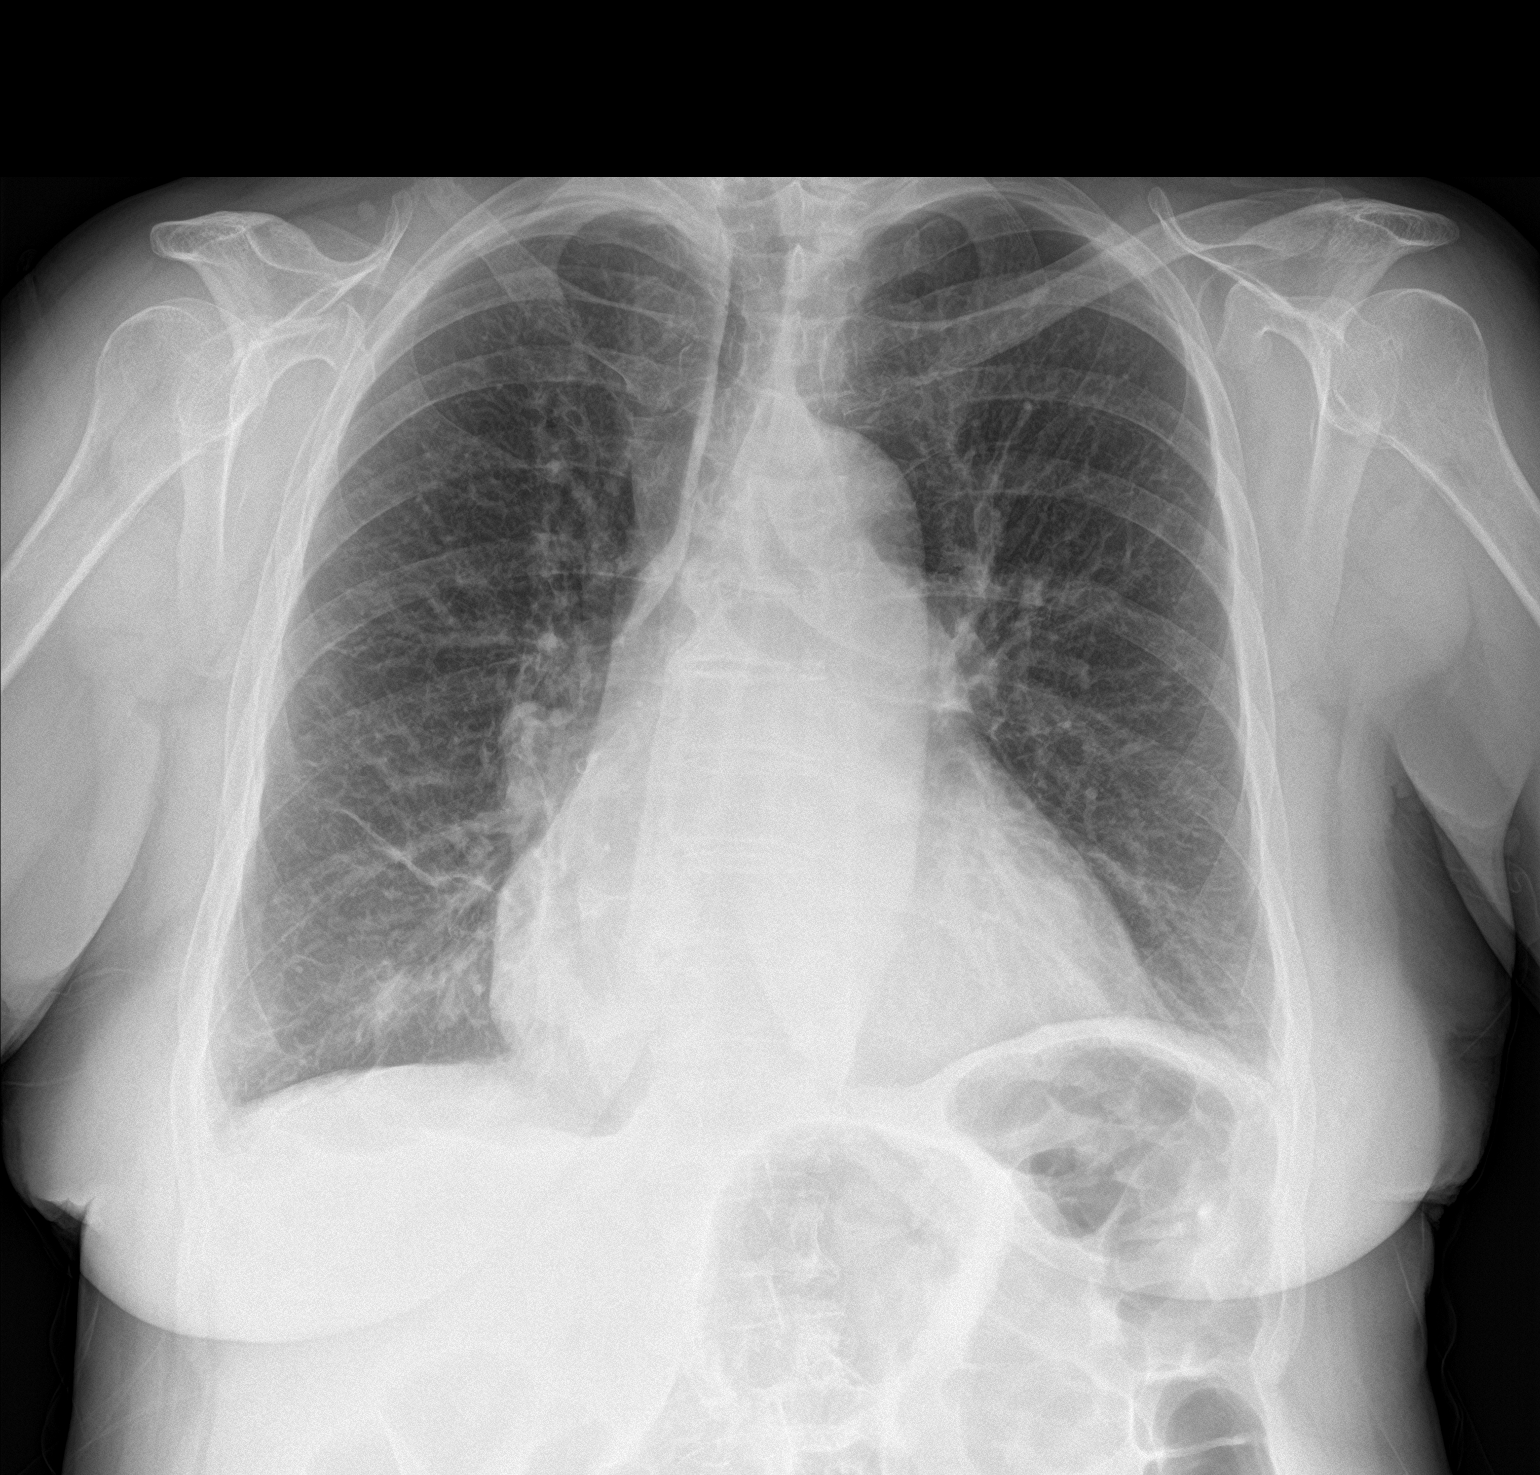

[chest lat]
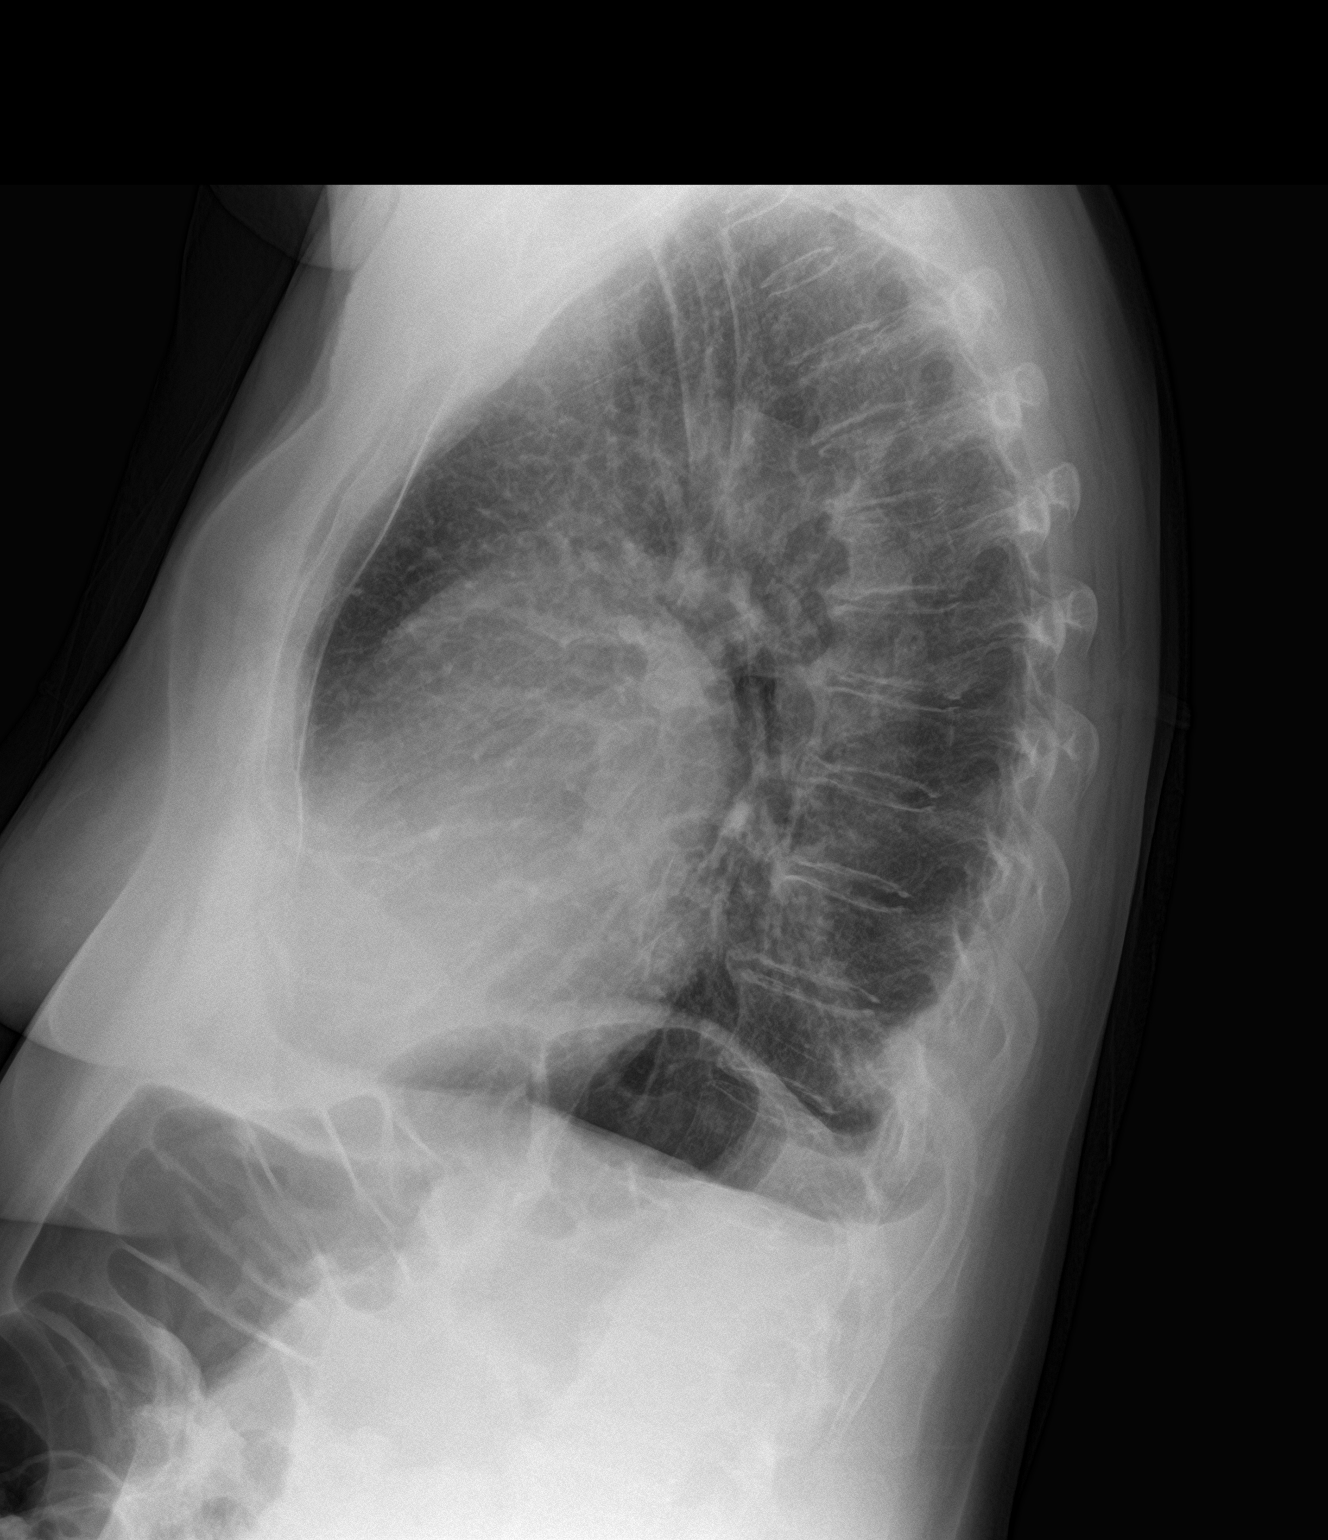

[2 of 2 positions shown; findings below may reference images not displayed]

FINDINGS: Cardiac silhouette is mildly enlarged. No mediastinal or hilar
masses. There is no evidence of adenopathy.

There are prominent bronchovascular markings. Few minor reticular
opacities noted at the bases consistent with scarring. No evidence
of pneumonia or pulmonary edema. No pleural effusion or
pneumothorax.

Skeletal structures are intact.
IMPRESSION: 1. No acute cardiopulmonary disease.
2. Mild cardiomegaly.

## 2019-01-05 DIAGNOSIS — G4733 Obstructive sleep apnea (adult) (pediatric): Secondary | ICD-10-CM | POA: Diagnosis not present

## 2019-01-05 DIAGNOSIS — J449 Chronic obstructive pulmonary disease, unspecified: Secondary | ICD-10-CM | POA: Diagnosis not present

## 2019-01-09 ENCOUNTER — Ambulatory Visit: Payer: Medicare HMO | Admitting: Nurse Practitioner

## 2019-02-05 DIAGNOSIS — G4733 Obstructive sleep apnea (adult) (pediatric): Secondary | ICD-10-CM | POA: Diagnosis not present

## 2019-02-05 DIAGNOSIS — J449 Chronic obstructive pulmonary disease, unspecified: Secondary | ICD-10-CM | POA: Diagnosis not present

## 2019-02-23 IMAGING — DX DG CHEST 1V PORT
1 series · 2 of 2 positions shown · non-contrast
Comparison: Radiographs April 03, 2018.

CLINICAL DATA: Cardiac arrest.

EXAM:
PORTABLE CHEST 1 VIEW

[Series 1: chest ap · 0.14mm/px · 2 of 2 slices shown]
[im 1/2]
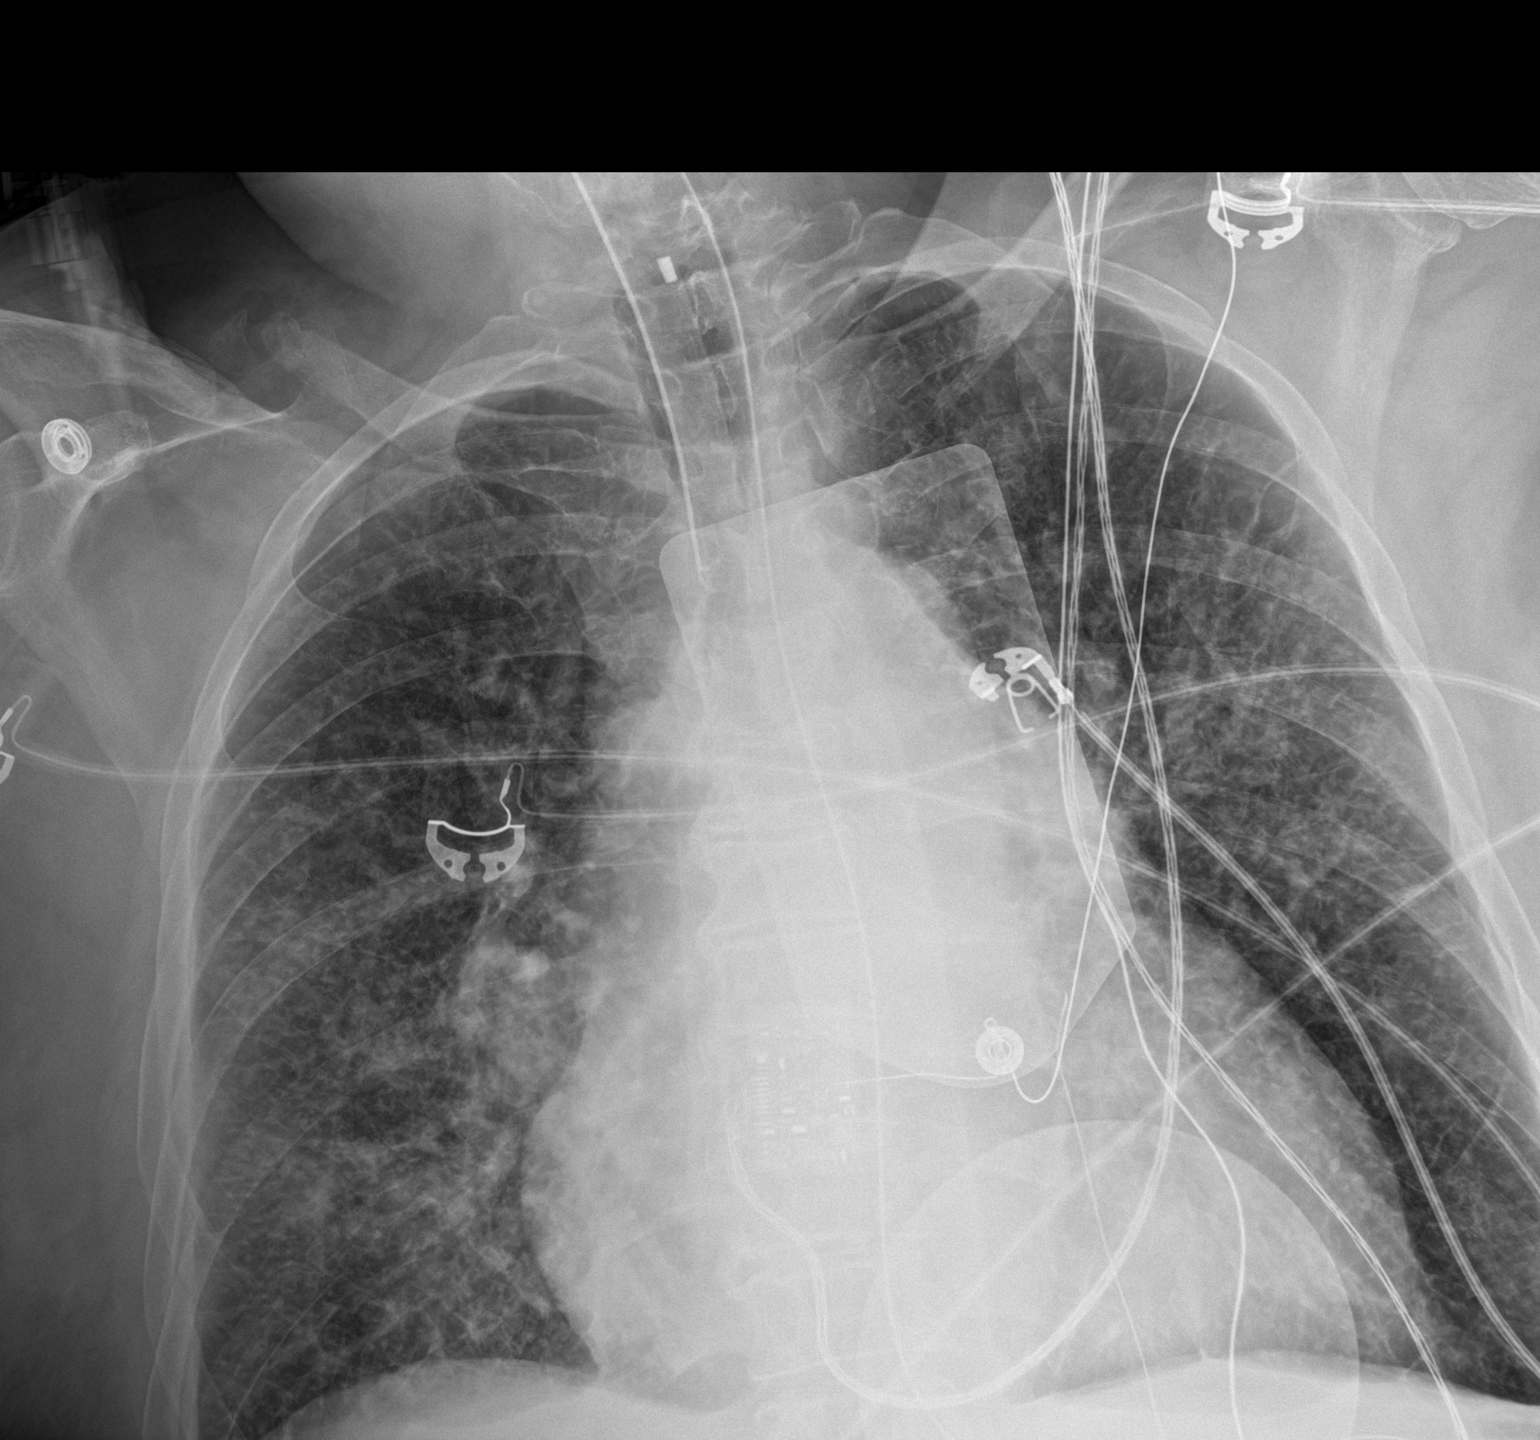
[im 2/2]
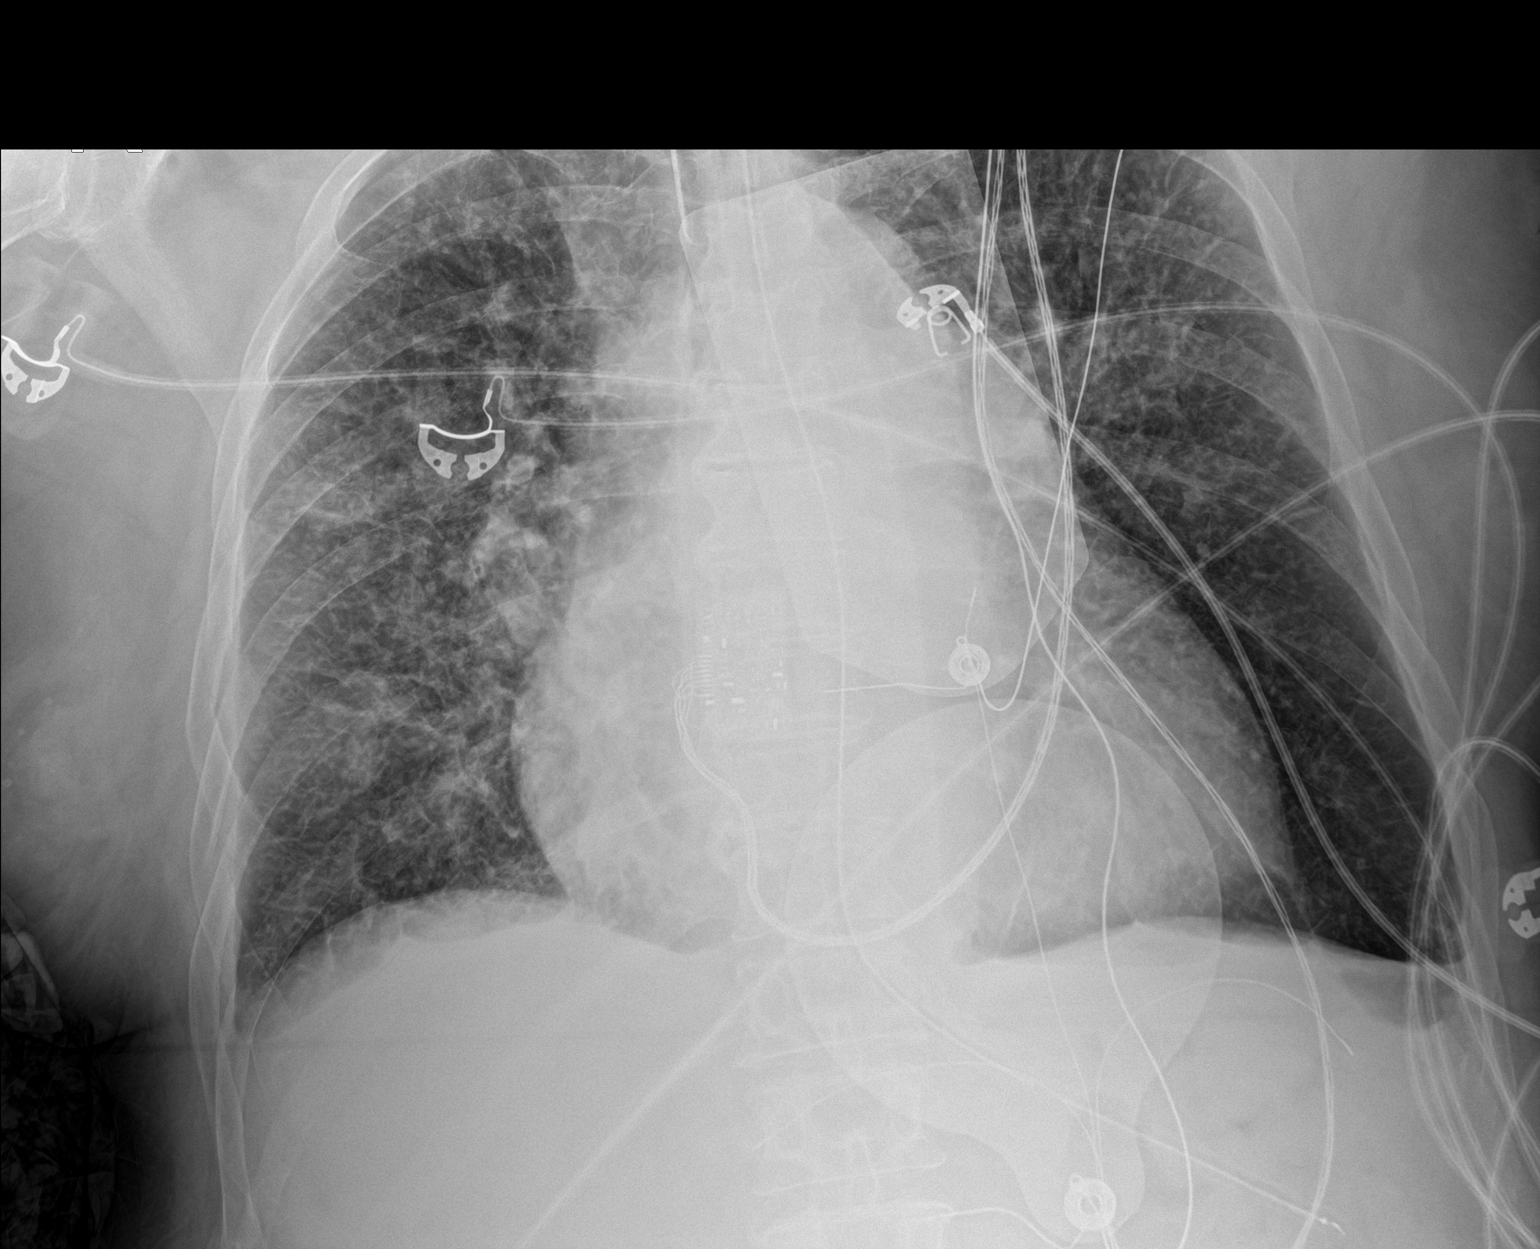

[2 of 2 positions shown; findings below may reference images not displayed]

FINDINGS: Stable cardiomegaly. Endotracheal and nasogastric tubes appear to be
in grossly good position. No pneumothorax or significant pleural
effusion is noted. Mild interstitial and reticular densities are
noted throughout both lungs which may represent pulmonary edema.
Bony thorax is unremarkable..
IMPRESSION: Endotracheal and nasogastric tubes appear to be in good position.
Mild interstitial and reticular densities are noted throughout both
lungs which may represent pulmonary edema.

## 2019-02-24 IMAGING — DX DG CHEST 1V PORT
1 series · 1 of 1 positions shown · non-contrast
Comparison: CT of the chest 06/04/2018.

CLINICAL DATA: Endotracheal tube present.  Cardiac arrest.

EXAM:
PORTABLE CHEST 1 VIEW

[chest ap]
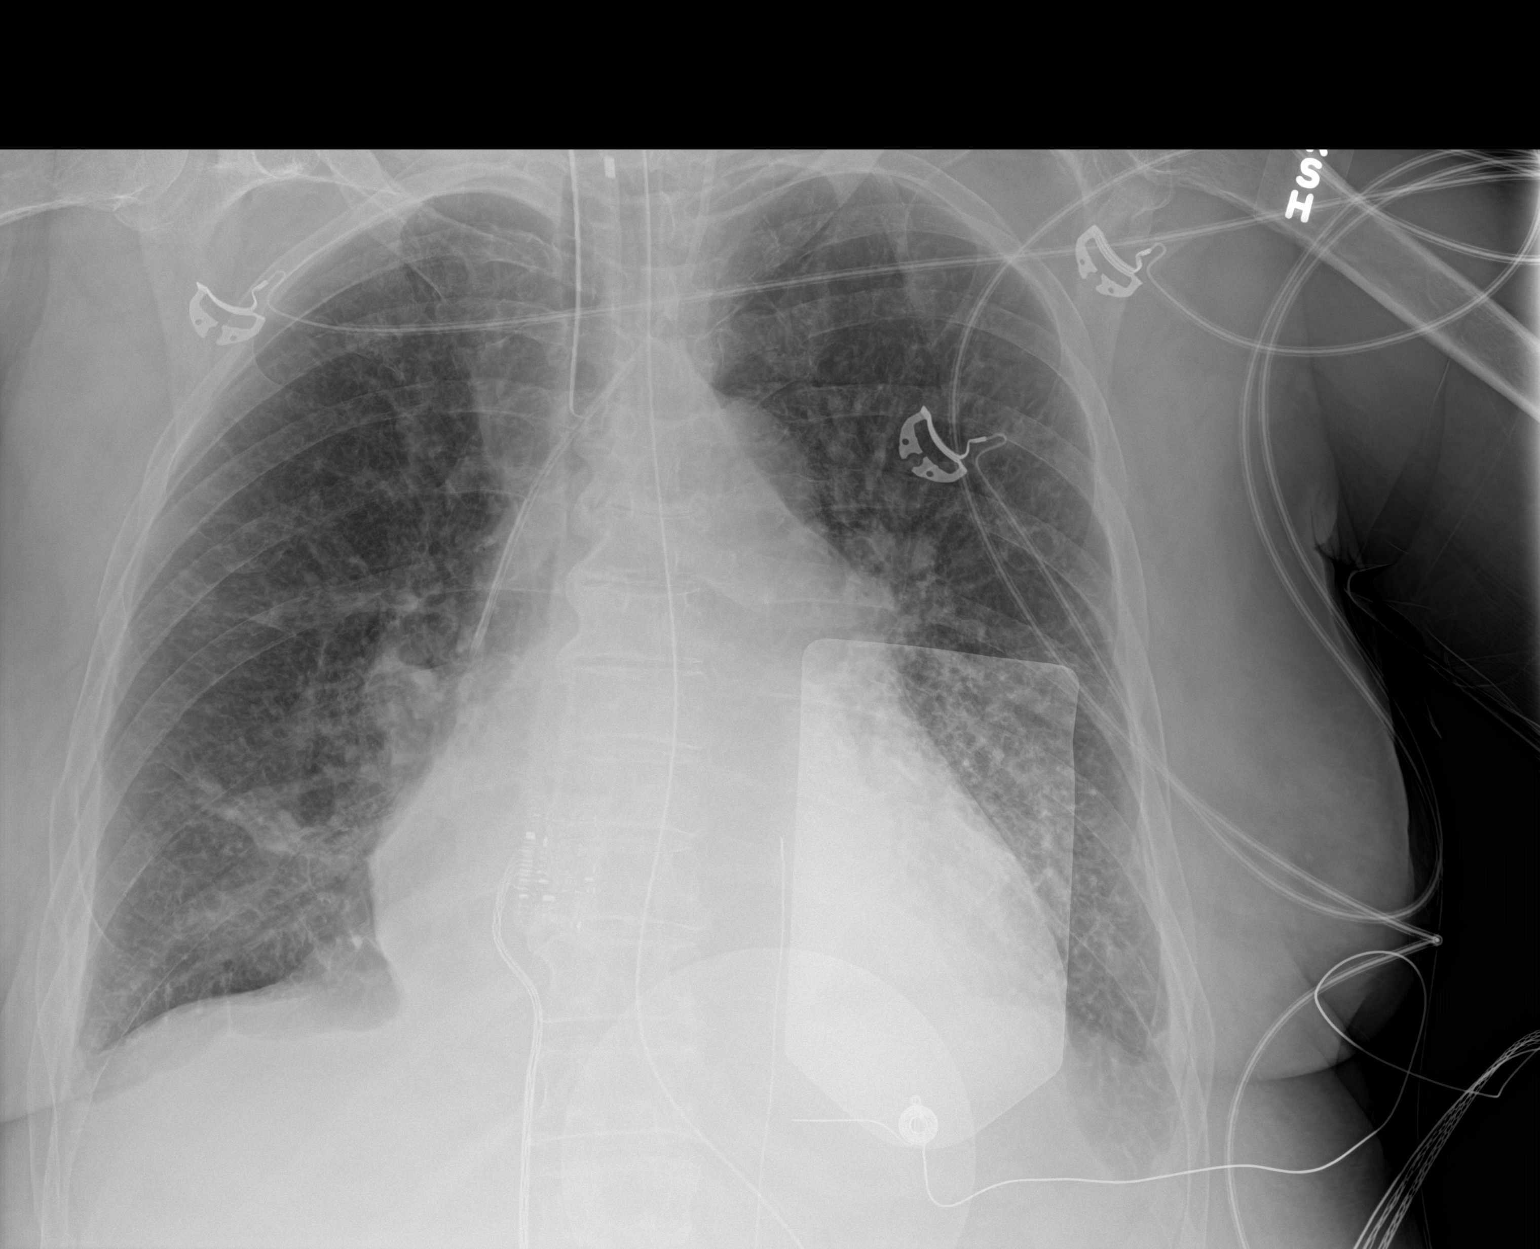

[1 of 1 positions shown; findings below may reference images not displayed]

FINDINGS: Cardiac enlargement is again noted. Endotracheal tube is stable.
Left IJ line is stable. NG tube courses off the inferior border the
film. Defibrillator pads remain in place.

Small effusions and mild pulmonary vascular congestion is stable.
Bibasilar airspace disease likely reflects atelectasis.
IMPRESSION: 1. Stable cardiomegaly and mild pulmonary vascular congestion.
2. Small effusions and bibasilar airspace disease, likely
atelectasis.
3. Support apparatus is stable.

## 2019-02-25 IMAGING — DX DG CHEST 1V PORT
1 series · 1 of 1 positions shown · non-contrast
Comparison: 06/05/2018

CLINICAL DATA: Endotracheal placement. Cardiac arrest.

EXAM:
PORTABLE CHEST 1 VIEW

[chest ap]
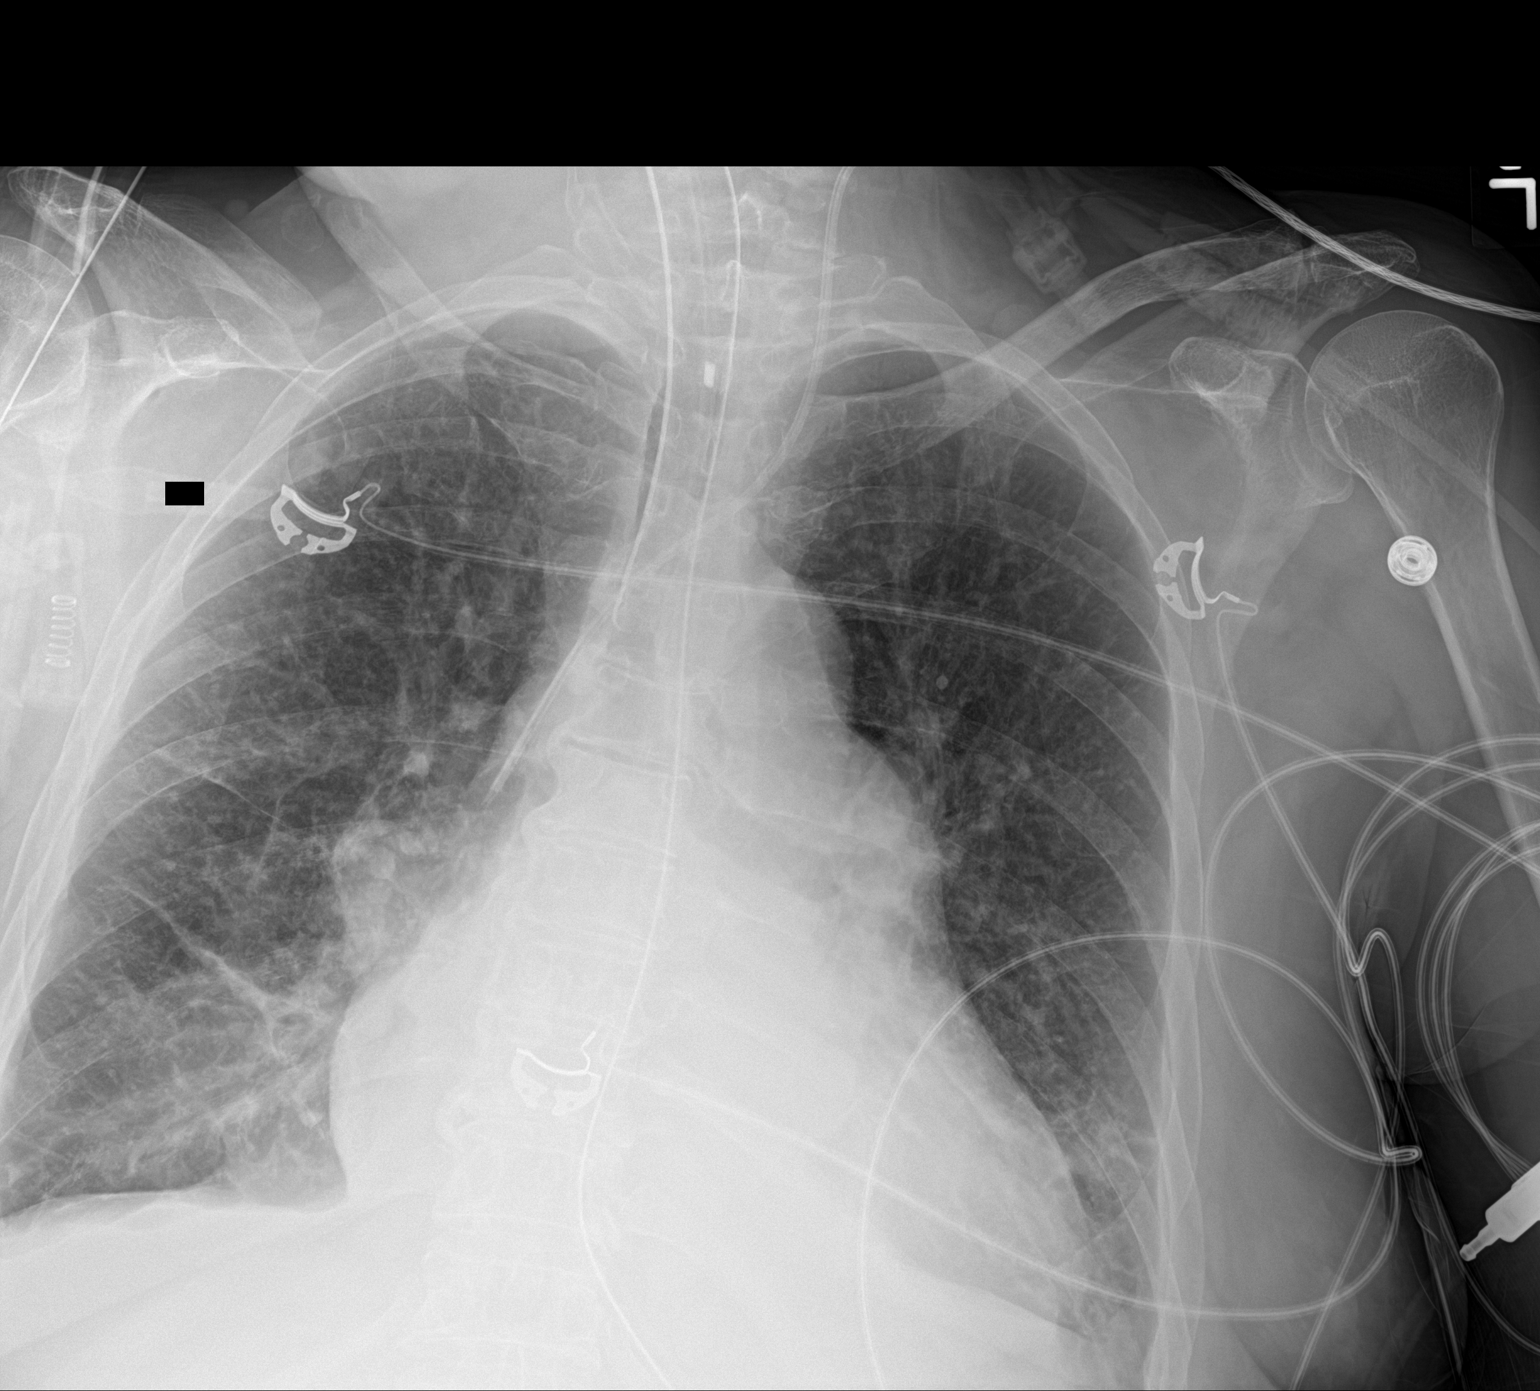

[1 of 1 positions shown; findings below may reference images not displayed]

FINDINGS: Endotracheal tube tip is 3.4 cm above the carina. New is a gastric
tube enters the stomach. Left internal jugular central line tip is
in the SVC above the right atrium. Upper lobes remain clear. Mild
bibasilar atelectasis, similar to yesterday's study.
IMPRESSION: Endotracheal tube and gastric tube well positioned. Persistent mild
basilar atelectasis.

## 2019-02-26 IMAGING — DX DG CHEST 1V PORT
1 series · 1 of 1 positions shown · non-contrast
Comparison: 06/06/2018

CLINICAL DATA: Check endotracheal tube placement

EXAM:
PORTABLE CHEST 1 VIEW

[chest]
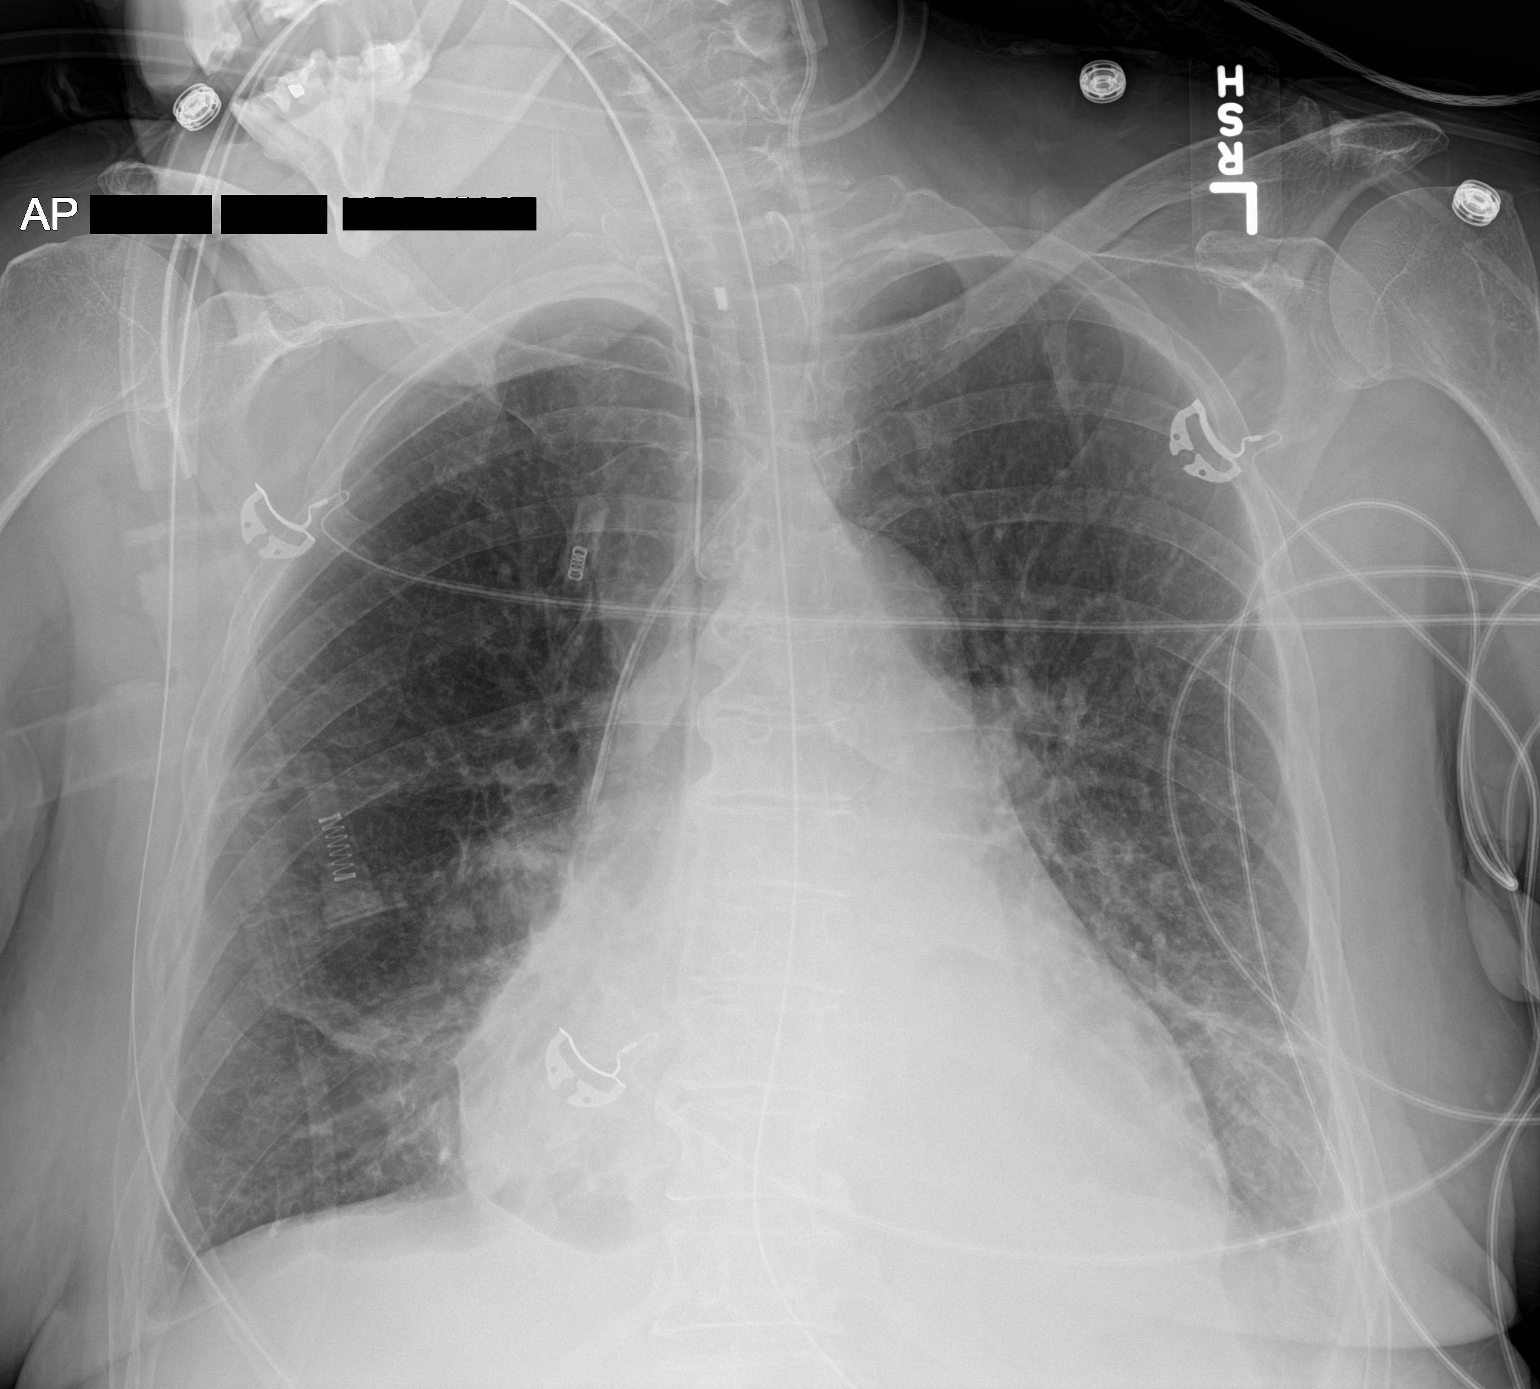

[1 of 1 positions shown; findings below may reference images not displayed]

FINDINGS: Endotracheal tube and gastric catheter are again noted and stable.
Left jugular central line is noted in the distal superior vena cava.
Cardiac shadow remains enlarged. Lungs are well aerated bilaterally
with mild bibasilar atelectasis stable from the prior exam. No new
focal abnormality is noted.
IMPRESSION: No significant interval change from the previous day.

## 2019-03-07 DIAGNOSIS — J449 Chronic obstructive pulmonary disease, unspecified: Secondary | ICD-10-CM | POA: Diagnosis not present

## 2019-03-07 DIAGNOSIS — G4733 Obstructive sleep apnea (adult) (pediatric): Secondary | ICD-10-CM | POA: Diagnosis not present

## 2019-03-13 ENCOUNTER — Other Ambulatory Visit: Payer: Self-pay

## 2019-03-13 ENCOUNTER — Emergency Department (HOSPITAL_COMMUNITY): Payer: Medicare HMO

## 2019-03-13 ENCOUNTER — Encounter (HOSPITAL_COMMUNITY): Payer: Self-pay | Admitting: Pharmacy Technician

## 2019-03-13 ENCOUNTER — Inpatient Hospital Stay (HOSPITAL_COMMUNITY)
Admission: EM | Admit: 2019-03-13 | Discharge: 2019-03-14 | DRG: 184 | Discharge status: Against Medical Advice | Payer: Medicare HMO | Attending: General Surgery | Admitting: General Surgery

## 2019-03-13 DIAGNOSIS — R001 Bradycardia, unspecified: Secondary | ICD-10-CM | POA: Diagnosis not present

## 2019-03-13 DIAGNOSIS — I5020 Unspecified systolic (congestive) heart failure: Secondary | ICD-10-CM | POA: Diagnosis not present

## 2019-03-13 DIAGNOSIS — S0083XA Contusion of other part of head, initial encounter: Secondary | ICD-10-CM | POA: Diagnosis not present

## 2019-03-13 DIAGNOSIS — Y9355 Activity, bike riding: Secondary | ICD-10-CM | POA: Diagnosis not present

## 2019-03-13 DIAGNOSIS — Z86718 Personal history of other venous thrombosis and embolism: Secondary | ICD-10-CM | POA: Diagnosis not present

## 2019-03-13 DIAGNOSIS — S299XXA Unspecified injury of thorax, initial encounter: Secondary | ICD-10-CM | POA: Diagnosis not present

## 2019-03-13 DIAGNOSIS — Z818 Family history of other mental and behavioral disorders: Secondary | ICD-10-CM

## 2019-03-13 DIAGNOSIS — S2242XA Multiple fractures of ribs, left side, initial encounter for closed fracture: Secondary | ICD-10-CM | POA: Diagnosis not present

## 2019-03-13 DIAGNOSIS — Z8674 Personal history of sudden cardiac arrest: Secondary | ICD-10-CM | POA: Diagnosis not present

## 2019-03-13 DIAGNOSIS — F17210 Nicotine dependence, cigarettes, uncomplicated: Secondary | ICD-10-CM | POA: Diagnosis present

## 2019-03-13 DIAGNOSIS — S42032A Displaced fracture of lateral end of left clavicle, initial encounter for closed fracture: Secondary | ICD-10-CM | POA: Diagnosis not present

## 2019-03-13 DIAGNOSIS — Z7951 Long term (current) use of inhaled steroids: Secondary | ICD-10-CM

## 2019-03-13 DIAGNOSIS — K7689 Other specified diseases of liver: Secondary | ICD-10-CM | POA: Diagnosis present

## 2019-03-13 DIAGNOSIS — E119 Type 2 diabetes mellitus without complications: Secondary | ICD-10-CM | POA: Diagnosis not present

## 2019-03-13 DIAGNOSIS — Z8249 Family history of ischemic heart disease and other diseases of the circulatory system: Secondary | ICD-10-CM

## 2019-03-13 DIAGNOSIS — E1165 Type 2 diabetes mellitus with hyperglycemia: Secondary | ICD-10-CM | POA: Diagnosis present

## 2019-03-13 DIAGNOSIS — Z03818 Encounter for observation for suspected exposure to other biological agents ruled out: Secondary | ICD-10-CM | POA: Diagnosis not present

## 2019-03-13 DIAGNOSIS — I252 Old myocardial infarction: Secondary | ICD-10-CM | POA: Diagnosis not present

## 2019-03-13 DIAGNOSIS — I5032 Chronic diastolic (congestive) heart failure: Secondary | ICD-10-CM | POA: Diagnosis present

## 2019-03-13 DIAGNOSIS — S3991XA Unspecified injury of abdomen, initial encounter: Secondary | ICD-10-CM | POA: Diagnosis not present

## 2019-03-13 DIAGNOSIS — E785 Hyperlipidemia, unspecified: Secondary | ICD-10-CM | POA: Diagnosis present

## 2019-03-13 DIAGNOSIS — S199XXA Unspecified injury of neck, initial encounter: Secondary | ICD-10-CM | POA: Diagnosis not present

## 2019-03-13 DIAGNOSIS — S060X0A Concussion without loss of consciousness, initial encounter: Secondary | ICD-10-CM | POA: Diagnosis present

## 2019-03-13 DIAGNOSIS — I2510 Atherosclerotic heart disease of native coronary artery without angina pectoris: Secondary | ICD-10-CM | POA: Diagnosis present

## 2019-03-13 DIAGNOSIS — Z79899 Other long term (current) drug therapy: Secondary | ICD-10-CM

## 2019-03-13 DIAGNOSIS — R609 Edema, unspecified: Secondary | ICD-10-CM | POA: Diagnosis not present

## 2019-03-13 DIAGNOSIS — S42002A Fracture of unspecified part of left clavicle, initial encounter for closed fracture: Secondary | ICD-10-CM | POA: Diagnosis not present

## 2019-03-13 DIAGNOSIS — Z20828 Contact with and (suspected) exposure to other viral communicable diseases: Secondary | ICD-10-CM | POA: Diagnosis present

## 2019-03-13 DIAGNOSIS — K219 Gastro-esophageal reflux disease without esophagitis: Secondary | ICD-10-CM | POA: Diagnosis present

## 2019-03-13 DIAGNOSIS — E7800 Pure hypercholesterolemia, unspecified: Secondary | ICD-10-CM | POA: Diagnosis present

## 2019-03-13 DIAGNOSIS — K838 Other specified diseases of biliary tract: Secondary | ICD-10-CM | POA: Diagnosis present

## 2019-03-13 DIAGNOSIS — R0902 Hypoxemia: Secondary | ICD-10-CM | POA: Diagnosis not present

## 2019-03-13 DIAGNOSIS — R52 Pain, unspecified: Secondary | ICD-10-CM | POA: Diagnosis not present

## 2019-03-13 DIAGNOSIS — Z886 Allergy status to analgesic agent status: Secondary | ICD-10-CM

## 2019-03-13 DIAGNOSIS — F209 Schizophrenia, unspecified: Secondary | ICD-10-CM | POA: Diagnosis present

## 2019-03-13 DIAGNOSIS — S0990XA Unspecified injury of head, initial encounter: Secondary | ICD-10-CM | POA: Diagnosis not present

## 2019-03-13 DIAGNOSIS — Z880 Allergy status to penicillin: Secondary | ICD-10-CM

## 2019-03-13 DIAGNOSIS — J449 Chronic obstructive pulmonary disease, unspecified: Secondary | ICD-10-CM | POA: Diagnosis present

## 2019-03-13 DIAGNOSIS — S2249XA Multiple fractures of ribs, unspecified side, initial encounter for closed fracture: Secondary | ICD-10-CM | POA: Diagnosis present

## 2019-03-13 DIAGNOSIS — Z888 Allergy status to other drugs, medicaments and biological substances status: Secondary | ICD-10-CM

## 2019-03-13 LAB — CBC
HCT: 43.3 % (ref 36.0–46.0)
Hemoglobin: 14.1 g/dL (ref 12.0–15.0)
MCH: 32.2 pg (ref 26.0–34.0)
MCHC: 32.6 g/dL (ref 30.0–36.0)
MCV: 98.9 fL (ref 80.0–100.0)
Platelets: 218 10*3/uL (ref 150–400)
RBC: 4.38 MIL/uL (ref 3.87–5.11)
RDW: 13.2 % (ref 11.5–15.5)
WBC: 16.6 10*3/uL — ABNORMAL HIGH (ref 4.0–10.5)
nRBC: 0 % (ref 0.0–0.2)

## 2019-03-13 LAB — COMPREHENSIVE METABOLIC PANEL
ALT: 16 U/L (ref 0–44)
AST: 20 U/L (ref 15–41)
Albumin: 3.5 g/dL (ref 3.5–5.0)
Alkaline Phosphatase: 105 U/L (ref 38–126)
Anion gap: 11 (ref 5–15)
BUN: 5 mg/dL — ABNORMAL LOW (ref 8–23)
CO2: 22 mmol/L (ref 22–32)
Calcium: 9.3 mg/dL (ref 8.9–10.3)
Chloride: 103 mmol/L (ref 98–111)
Creatinine, Ser: 0.6 mg/dL (ref 0.44–1.00)
GFR calc Af Amer: >60 mL/min (ref >60)
GFR calc non Af Amer: >60 mL/min (ref >60)
Glucose, Bld: 116 mg/dL — ABNORMAL HIGH (ref 70–99)
Potassium: 3.8 mmol/L (ref 3.5–5.1)
Sodium: 136 mmol/L (ref 135–145)
Total Bilirubin: 0.6 mg/dL (ref 0.3–1.2)
Total Protein: 5.8 g/dL — ABNORMAL LOW (ref 6.5–8.1)

## 2019-03-13 LAB — CDS SEROLOGY

## 2019-03-13 LAB — I-STAT CHEM 8, ED
BUN: 5 mg/dL — ABNORMAL LOW (ref 8–23)
Calcium, Ion: 1.15 mmol/L (ref 1.15–1.40)
Chloride: 102 mmol/L (ref 98–111)
Creatinine, Ser: 0.5 mg/dL (ref 0.44–1.00)
Glucose, Bld: 114 mg/dL — ABNORMAL HIGH (ref 70–99)
HCT: 44 % (ref 36.0–46.0)
Hemoglobin: 15 g/dL (ref 12.0–15.0)
Potassium: 3.7 mmol/L (ref 3.5–5.1)
Sodium: 136 mmol/L (ref 135–145)
TCO2: 27 mmol/L (ref 22–32)

## 2019-03-13 LAB — SAMPLE TO BLOOD BANK

## 2019-03-13 LAB — CBG MONITORING, ED: Glucose-Capillary: 111 mg/dL — ABNORMAL HIGH (ref 70–99)

## 2019-03-13 LAB — ETHANOL: Alcohol, Ethyl (B): <10 mg/dL (ref <10)

## 2019-03-13 LAB — LACTIC ACID, PLASMA: Lactic Acid, Venous: 1.7 mmol/L (ref 0.5–1.9)

## 2019-03-13 LAB — PROTIME-INR
INR: 1.1 (ref 0.8–1.2)
Prothrombin Time: 14.1 seconds (ref 11.4–15.2)

## 2019-03-13 MED ORDER — ONDANSETRON HCL 4 MG/2ML IJ SOLN: 4.0000 mg | Freq: Four times a day (QID) | INTRAMUSCULAR | Status: DC | PRN

## 2019-03-13 MED ORDER — ENOXAPARIN SODIUM 40 MG/0.4ML ~~LOC~~ SOLN
40.0000 mg | SUBCUTANEOUS | Status: DC
  Administered 2019-03-14: 40 mg via SUBCUTANEOUS
  Filled 2019-03-13: qty 0.4

## 2019-03-13 MED ORDER — ONDANSETRON 4 MG PO TBDP: 4.0000 mg | ORAL_TABLET | Freq: Four times a day (QID) | ORAL | Status: DC | PRN

## 2019-03-13 MED ORDER — METOPROLOL TARTRATE 5 MG/5ML IV SOLN: 5.0000 mg | Freq: Four times a day (QID) | INTRAVENOUS | Status: DC | PRN

## 2019-03-13 MED ORDER — ALBUTEROL SULFATE HFA 108 (90 BASE) MCG/ACT IN AERS
1.0000 | INHALATION_SPRAY | RESPIRATORY_TRACT | Status: DC | PRN
  Filled 2019-03-13: qty 6.7

## 2019-03-13 MED ORDER — TRAMADOL HCL 50 MG PO TABS
50.0000 mg | ORAL_TABLET | Freq: Four times a day (QID) | ORAL | Status: DC
  Filled 2019-03-13 (×2): qty 1

## 2019-03-13 MED ORDER — OXYCODONE HCL 5 MG PO TABS: 5.0000 mg | ORAL_TABLET | ORAL | Status: DC | PRN

## 2019-03-13 MED ORDER — POTASSIUM CHLORIDE IN NACL 20-0.9 MEQ/L-% IV SOLN
INTRAVENOUS | Status: DC
  Administered 2019-03-14: 03:00:00 via INTRAVENOUS
  Filled 2019-03-13 (×2): qty 1000

## 2019-03-13 MED ORDER — IOHEXOL 300 MG/ML  SOLN
100.0000 mL | Freq: Once | INTRAMUSCULAR | Status: AC | PRN
  Administered 2019-03-13: 100 mL via INTRAVENOUS

## 2019-03-13 MED ORDER — HYDROMORPHONE HCL 1 MG/ML IJ SOLN: 0.5000 mg | INTRAMUSCULAR | Status: DC | PRN

## 2019-03-13 MED ORDER — OXYCODONE HCL 5 MG PO TABS: 10.0000 mg | ORAL_TABLET | ORAL | Status: DC | PRN
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
## 2019-03-14 LAB — CBC
HCT: 40.1 % (ref 36.0–46.0)
Hemoglobin: 13.2 g/dL (ref 12.0–15.0)
MCH: 31.8 pg (ref 26.0–34.0)
MCHC: 32.9 g/dL (ref 30.0–36.0)
MCV: 96.6 fL (ref 80.0–100.0)
Platelets: 185 10*3/uL (ref 150–400)
RBC: 4.15 MIL/uL (ref 3.87–5.11)
RDW: 13.2 % (ref 11.5–15.5)
WBC: 12.8 10*3/uL — ABNORMAL HIGH (ref 4.0–10.5)
nRBC: 0 % (ref 0.0–0.2)

## 2019-03-14 LAB — SARS CORONAVIRUS 2 (TAT 6-24 HRS): SARS Coronavirus 2: NEGATIVE

## 2019-03-14 MED ORDER — ALBUTEROL SULFATE (2.5 MG/3ML) 0.083% IN NEBU: 2.5000 mg | INHALATION_SOLUTION | RESPIRATORY_TRACT | Status: DC | PRN

## 2019-03-14 MED ORDER — IPRATROPIUM-ALBUTEROL 0.5-2.5 (3) MG/3ML IN SOLN
3.0000 mL | Freq: Once | RESPIRATORY_TRACT | Status: AC
  Administered 2019-03-14: 3 mL via RESPIRATORY_TRACT
  Filled 2019-03-14: qty 3

## 2019-03-14 MED ORDER — OXYCODONE HCL 5 MG PO TABS: 5.0000 mg | ORAL_TABLET | ORAL | Status: DC | PRN
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
## 2019-03-14 NOTE — Progress Notes (Signed)
Arrived to 6n24 from ED at this time. Sleepy but easily arousable
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
## 2019-03-25 ENCOUNTER — Ambulatory Visit: Payer: Self-pay | Admitting: Pharmacist

## 2019-03-25 NOTE — Progress Notes (Addendum)
  Chronic Care Management   Outreach Note  03/25/2019 Name: Kim [name] MRN: [id number] DOB: 31-Oct-1955  Referred by: Glendale Chard, MD Reason for referral : Chronic Care Management   An unsuccessful telephone outreach was attempted today. The patient was referred to the case management team by for assistance with care management and care coordination.  Unfortunately, I was unable to reach the patient at [facility] today.  They stated that [name] no longer lives there.  Mobile phone was not available.  I will reach out to CCM team to see if a working phone number is available.  Follow Up Plan: The care management team will reach out to the patient again when new contact information is discovered  SIGNATURE Regina Eck, PharmD, [place] Pharmacist, [place]: [phone]

## 2019-03-26 ENCOUNTER — Telehealth: Payer: Self-pay
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
## 2019-03-27 ENCOUNTER — Telehealth: Payer: Self-pay

## 2019-03-27 NOTE — Telephone Encounter (Signed)
Patient called stating she wants an appointment for Friday. She stated it was ok to leave her a v/m or text her. [phone]   I RETURNED HER CALL AND LEFT HER A V/M TO LET HER KNOW WE ARE NO LONGER OPEN ON FRIDAYS BUT WE HAVE AN APPOINTMENT TODAY FOR 2PM WITH S[facility]. Lonia Mad

## 2019-03-31 ENCOUNTER — Ambulatory Visit (INDEPENDENT_AMBULATORY_CARE_PROVIDER_SITE_OTHER): Payer: Medicare HMO | Admitting: Nurse Practitioner

## 2019-03-31 ENCOUNTER — Encounter: Payer: Self-pay | Admitting: Nurse Practitioner

## 2019-03-31 ENCOUNTER — Other Ambulatory Visit: Payer: Self-pay

## 2019-03-31 VITALS — BP 122/70 | HR 70 | Temp 98.5°F | Ht 63.0 in | Wt 155.8 lb

## 2019-03-31 DIAGNOSIS — S42002A Fracture of unspecified part of left clavicle, initial encounter for closed fracture: Secondary | ICD-10-CM

## 2019-03-31 DIAGNOSIS — Z139 Encounter for screening, unspecified: Secondary | ICD-10-CM | POA: Diagnosis not present

## 2019-03-31 DIAGNOSIS — Z20828 Contact with and (suspected) exposure to other viral communicable diseases: Secondary | ICD-10-CM

## 2019-03-31 DIAGNOSIS — J42 Unspecified chronic bronchitis: Secondary | ICD-10-CM | POA: Diagnosis not present

## 2019-03-31 DIAGNOSIS — J449 Chronic obstructive pulmonary disease, unspecified: Secondary | ICD-10-CM | POA: Diagnosis not present

## 2019-03-31 DIAGNOSIS — M25552 Pain in left hip: Secondary | ICD-10-CM | POA: Diagnosis not present

## 2019-03-31 MED ORDER — TRAMADOL HCL 50 MG PO TABS: 50.0000 mg | ORAL_TABLET | Freq: Four times a day (QID) | ORAL | 0 refills | Status: AC | PRN

## 2019-03-31 MED ORDER — FLUTICASONE-SALMETEROL 100-50 MCG/DOSE IN AEPB: 1.0000 | INHALATION_SPRAY | Freq: Two times a day (BID) | RESPIRATORY_TRACT | 3 refills | Status: AC
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
## 2019-03-31 NOTE — Patient Instructions (Signed)
You can take over the counter loratadine from the [employer] for your runny nose.

## 2019-04-01 LAB — NOVEL CORONAVIRUS, NAA: SARS-CoV-2, NAA: NOT DETECTED

## 2019-04-03 ENCOUNTER — Encounter: Payer: Self-pay | Admitting: Nurse Practitioner

## 2019-04-07 DIAGNOSIS — S42002D Fracture of unspecified part of left clavicle, subsequent encounter for fracture with routine healing: Secondary | ICD-10-CM | POA: Diagnosis not present

## 2019-04-07 DIAGNOSIS — G4733 Obstructive sleep apnea (adult) (pediatric): Secondary | ICD-10-CM | POA: Diagnosis not present

## 2019-04-07 DIAGNOSIS — J449 Chronic obstructive pulmonary disease, unspecified: Secondary | ICD-10-CM | POA: Diagnosis not present

## 2019-04-07 DIAGNOSIS — R262 Difficulty in walking, not elsewhere classified: Secondary | ICD-10-CM | POA: Diagnosis not present

## 2019-05-07 DIAGNOSIS — J449 Chronic obstructive pulmonary disease, unspecified: Secondary | ICD-10-CM | POA: Diagnosis not present

## 2019-05-07 DIAGNOSIS — S42002D Fracture of unspecified part of left clavicle, subsequent encounter for fracture with routine healing: Secondary | ICD-10-CM | POA: Diagnosis not present

## 2019-05-07 DIAGNOSIS — R262 Difficulty in walking, not elsewhere classified: Secondary | ICD-10-CM | POA: Diagnosis not present

## 2019-05-07 DIAGNOSIS — G4733 Obstructive sleep apnea (adult) (pediatric): Secondary | ICD-10-CM | POA: Diagnosis not present

## 2019-05-17 ENCOUNTER — Encounter: Payer: Self-pay | Admitting: Nurse Practitioner

## 2019-08-13 ENCOUNTER — Encounter: Payer: Medicare HMO | Admitting: Nurse Practitioner

## 2019-08-13 ENCOUNTER — Ambulatory Visit: Payer: Medicare HMO
